# Patient Record
Sex: Female | Born: 1950
Health system: Southern US, Community
[De-identification: ages and names within clinical notes are randomized; demographics above are authoritative.]

## PROBLEM LIST (undated history)

## (undated) DIAGNOSIS — R0600 Dyspnea, unspecified: Secondary | ICD-10-CM

## (undated) DIAGNOSIS — E785 Hyperlipidemia, unspecified: Secondary | ICD-10-CM

## (undated) DIAGNOSIS — N899 Noninflammatory disorder of vagina, unspecified: Secondary | ICD-10-CM

## (undated) DIAGNOSIS — H269 Unspecified cataract: Secondary | ICD-10-CM

## (undated) DIAGNOSIS — N809 Endometriosis, unspecified: Secondary | ICD-10-CM

## (undated) DIAGNOSIS — M810 Age-related osteoporosis without current pathological fracture: Secondary | ICD-10-CM

## (undated) DIAGNOSIS — Q438 Other specified congenital malformations of intestine: Secondary | ICD-10-CM

## (undated) DIAGNOSIS — R0602 Shortness of breath: Secondary | ICD-10-CM

## (undated) DIAGNOSIS — T7840XA Allergy, unspecified, initial encounter: Secondary | ICD-10-CM

## (undated) DIAGNOSIS — D219 Benign neoplasm of connective and other soft tissue, unspecified: Secondary | ICD-10-CM

## (undated) DIAGNOSIS — T753XXA Motion sickness, initial encounter: Secondary | ICD-10-CM

## (undated) DIAGNOSIS — H04129 Dry eye syndrome of unspecified lacrimal gland: Secondary | ICD-10-CM

## (undated) HISTORY — DX: Benign neoplasm of connective and other soft tissue, unspecified: D21.9

## (undated) HISTORY — PX: APPENDECTOMY: SHX54

## (undated) HISTORY — PX: OTHER SURGICAL HISTORY: SHX169

## (undated) HISTORY — DX: Noninflammatory disorder of vagina, unspecified: N89.9

## (undated) HISTORY — DX: Endometriosis, unspecified: N80.9

## (undated) HISTORY — DX: Unspecified cataract: H26.9

## (undated) HISTORY — DX: Hyperlipidemia, unspecified: E78.5

## (undated) HISTORY — PX: EYE SURGERY: SHX253

## (undated) HISTORY — DX: Age-related osteoporosis without current pathological fracture: M81.0

## (undated) HISTORY — DX: Shortness of breath: R06.02

## (undated) HISTORY — DX: Hypercalcemia: E83.52

## (undated) HISTORY — DX: Allergy, unspecified, initial encounter: T78.40XA

## (undated) HISTORY — DX: Dry eye syndrome of unspecified lacrimal gland: H04.129

---

## 1992-11-21 HISTORY — PX: ABDOMINAL HYSTERECTOMY: SHX81

## 1999-09-27 ENCOUNTER — Encounter: Admission: RE | Admit: 1999-09-27 | Discharge: 1999-09-27 | Payer: Self-pay | Admitting: Obstetrics and Gynecology

## 1999-09-27 ENCOUNTER — Encounter: Payer: Self-pay | Admitting: Obstetrics and Gynecology

## 2000-09-29 ENCOUNTER — Encounter: Admission: RE | Admit: 2000-09-29 | Discharge: 2000-09-29 | Payer: Self-pay | Admitting: Family Medicine

## 2000-09-29 ENCOUNTER — Encounter: Payer: Self-pay | Admitting: Family Medicine

## 2001-10-09 ENCOUNTER — Encounter: Admission: RE | Admit: 2001-10-09 | Discharge: 2001-10-09 | Payer: Self-pay | Admitting: Family Medicine

## 2001-10-09 ENCOUNTER — Encounter: Payer: Self-pay | Admitting: Family Medicine

## 2004-02-19 LAB — HM COLONOSCOPY

## 2005-08-24 ENCOUNTER — Ambulatory Visit: Payer: Self-pay | Admitting: Family Medicine

## 2006-09-07 ENCOUNTER — Ambulatory Visit: Payer: Self-pay | Admitting: Family Medicine

## 2007-09-11 ENCOUNTER — Ambulatory Visit: Payer: Self-pay | Admitting: Family Medicine

## 2008-09-16 ENCOUNTER — Ambulatory Visit: Payer: Self-pay | Admitting: Family Medicine

## 2008-09-18 ENCOUNTER — Ambulatory Visit: Payer: Self-pay | Admitting: Family Medicine

## 2009-09-17 ENCOUNTER — Ambulatory Visit: Payer: Self-pay | Admitting: Family Medicine

## 2010-09-28 ENCOUNTER — Ambulatory Visit: Payer: Self-pay

## 2010-10-13 ENCOUNTER — Emergency Department: Payer: Self-pay | Admitting: Emergency Medicine

## 2010-10-26 ENCOUNTER — Ambulatory Visit: Payer: Self-pay

## 2011-10-05 ENCOUNTER — Ambulatory Visit: Payer: Self-pay | Admitting: Family Medicine

## 2012-04-25 ENCOUNTER — Ambulatory Visit: Payer: Self-pay | Admitting: Nurse Practitioner

## 2012-07-18 ENCOUNTER — Other Ambulatory Visit (INDEPENDENT_AMBULATORY_CARE_PROVIDER_SITE_OTHER): Payer: Self-pay | Admitting: Surgery

## 2012-07-18 ENCOUNTER — Encounter (INDEPENDENT_AMBULATORY_CARE_PROVIDER_SITE_OTHER): Payer: Self-pay | Admitting: Surgery

## 2012-07-18 ENCOUNTER — Ambulatory Visit (INDEPENDENT_AMBULATORY_CARE_PROVIDER_SITE_OTHER): Payer: BC Managed Care – PPO | Admitting: Surgery

## 2012-07-18 VITALS — BP 134/82 | HR 68 | Temp 97.9°F | Resp 16 | Ht 69.0 in | Wt 143.2 lb

## 2012-07-18 DIAGNOSIS — D171 Benign lipomatous neoplasm of skin and subcutaneous tissue of trunk: Secondary | ICD-10-CM | POA: Insufficient documentation

## 2012-07-18 DIAGNOSIS — D1779 Benign lipomatous neoplasm of other sites: Secondary | ICD-10-CM

## 2012-07-18 NOTE — Patient Instructions (Signed)
Thanks for your patience.  If you need further assistance after leaving the office, please call our office and speak with Tracy A.  [phone].  If you want to leave a message for Dr. Carolyn Sylvia, please call his office phone at [phone]. 

## 2012-07-18 NOTE — Progress Notes (Signed)
Chief Complaint:  Mass over left scapula for 20 years recently increasing in size  History of Present Illness:  Barbara Gates is an 61 y.o. female referred by Dr. Margo Aye with a mass of her left scapula.  She describes that she has had this mass for approximately 20 years recently become more apparent to her possibly increase in size. It is nontender. She comes with an ultrasound done at [facility]. I was unable to get this to load on the computer but reviewed the report which shows this to be a mass arising above the scapula.  I discussed excision under general the prone position although we might be able to do this under MAC in the lateral position. We'll schedule this at [facility].  History reviewed. No pertinent past medical history.  Past Surgical History  Procedure Date  . Abdominal hysterectomy     Current Outpatient Prescriptions  Medication Sig Dispense Refill  . calcium carbonate (TUMS - DOSED IN MG ELEMENTAL CALCIUM) 500 MG chewable tablet Chew 1 tablet by mouth daily.      . fish oil-omega-3 fatty acids 1000 MG capsule Take 2 g by mouth daily.      . magnesium 30 MG tablet Take 30 mg by mouth 2 (two) times daily.      . Multiple Vitamin (MULTIVITAMIN) tablet Take 1 tablet by mouth daily.      Marland Kitchen OVER THE COUNTER MEDICATION       . Vitamin D, Ergocalciferol, (DRISDOL) 50000 UNITS CAPS Take 50,000 Units by mouth daily.       Review of patient's allergies indicates no known allergies. Family History  Problem Relation Age of Onset  . Cancer Mother     lung, pancreas, melanoma, carcinoid tumors  . Heart disease Mother   . Cancer Sister     breast   Social History:   reports that she has never smoked. She has never used smokeless tobacco. She reports that she drinks alcohol. She reports that she does not use illicit drugs.   REVIEW OF SYSTEMS - PERTINENT POSITIVES ONLY: noncontibutory  Physical Exam:   Blood pressure 134/82, pulse 68, temperature 97.9 F (36.6  C), temperature source Temporal, resp. rate 16, height 5\' 9"  (1.753 m), weight 143 lb 3.2 oz (64.955 kg). Body mass index is 21.15 kg/(m^2).  Gen:  WDWN WF NAD  Neurological: Alert and oriented to person, place, and time. Motor and sensory function is grossly intact  Head: Normocephalic and atraumatic.  Eyes: Conjunctivae are normal. Pupils are equal, round, and reactive to light. No scleral icterus.  Neck: Normal range of motion. Neck supple. No tracheal deviation or thyromegaly present.  Cardiovascular:  SR without murmurs or gallops.  No carotid bruits Respiratory: Effort normal.  No respiratory distress. No chest wall tenderness. Breath sounds normal.  No wheezes, rales or rhonchi.  Abdomen:  nontender GU: Musculoskeletal: Normal range of motion. Extremities are nontender. No cyanosis, edema or clubbing noted  BACK: soft, rubbery mass medial to the scapula that feels like it is in the subcutaneous region.   Lymphadenopathy: No cervical, preauricular, postauricular or axillary adenopathy is present Skin: Skin is warm and dry. No rash noted. No diaphoresis. No erythema. No pallor. Pscyh: Normal mood and affect. Behavior is normal. Judgment and thought content normal.   LABORATORY RESULTS: No results found for this or any previous visit (from the past 48 hour(s)).  RADIOLOGY RESULTS: No results found.  Problem List: There is no problem list on file for  this patient.   Assessment & Plan: Probable lipoma of the back Excision at [facility]    Matt B. Daphine Deutscher, MD, [facility] [phone] beeper [phone]  07/18/2012 2:32 PM

## 2012-09-07 ENCOUNTER — Encounter (HOSPITAL_BASED_OUTPATIENT_CLINIC_OR_DEPARTMENT_OTHER): Payer: Self-pay

## 2012-09-07 ENCOUNTER — Ambulatory Visit (HOSPITAL_BASED_OUTPATIENT_CLINIC_OR_DEPARTMENT_OTHER): Admit: 2012-09-07 | Payer: Self-pay | Admitting: Surgery

## 2012-09-07 SURGERY — EXCISION MASS
Anesthesia: General | Site: Back

## 2012-09-12 LAB — HM HEPATITIS C SCREENING LAB: HM Hepatitis Screen: NEGATIVE

## 2012-10-10 ENCOUNTER — Ambulatory Visit: Payer: Self-pay | Admitting: Family Medicine

## 2012-10-17 ENCOUNTER — Ambulatory Visit: Payer: Self-pay | Admitting: Family Medicine

## 2012-11-08 HISTORY — PX: BREAST BIOPSY: SHX20

## 2012-12-13 HISTORY — PX: OTHER SURGICAL HISTORY: SHX169

## 2012-12-21 ENCOUNTER — Encounter (INDEPENDENT_AMBULATORY_CARE_PROVIDER_SITE_OTHER): Payer: Self-pay

## 2013-06-13 ENCOUNTER — Ambulatory Visit: Payer: Self-pay

## 2013-10-14 LAB — HM DEXA SCAN

## 2013-10-30 ENCOUNTER — Ambulatory Visit: Payer: Self-pay | Admitting: Family Medicine

## 2014-01-09 ENCOUNTER — Other Ambulatory Visit: Payer: Self-pay | Admitting: General Surgery

## 2014-01-09 ENCOUNTER — Telehealth: Payer: Self-pay | Admitting: *Deleted

## 2014-01-09 NOTE — Telephone Encounter (Signed)
I talked with the patient and she had her mammograms at [facility] (Dr Bary Castilla reviewed and stable) She is to continue her Tamoxifen and she desires and feels comfortable to be followed with Dr Venia Minks. Side effects reviewed and discussed, pt agrees to monitor and call if she notices any signs or vaginal bleeding. Her most recent bone density had shown improvement. She will call if new issues arise or concerns.

## 2014-01-09 NOTE — Telephone Encounter (Signed)
Message copied by Carson Myrtle on Thu Jan 09, 2014 11:34 AM ------      Message from: [place], Forest Gleason      Created: Thu Jan 09, 2014 11:26 AM       Patient did not have Dec 2014 mammogram as scheduled by Dolores Frame, NP at [facility] .(scheduled at [facility], ? If completed elsewhere.      Contact patient and see if she has had the mammogram completed: if so, where, if not she needs a screening bilateral mammogram at this time w/ OV to follow.        Tamoxifen RX will be refilled, but f/ u needed.  ------

## 2014-04-22 ENCOUNTER — Ambulatory Visit: Payer: Self-pay | Admitting: Gastroenterology

## 2014-09-02 DIAGNOSIS — K5909 Other constipation: Secondary | ICD-10-CM | POA: Insufficient documentation

## 2014-11-11 ENCOUNTER — Ambulatory Visit: Payer: Self-pay | Admitting: Family Medicine

## 2014-11-11 LAB — HM MAMMOGRAPHY

## 2015-04-17 LAB — CBC AND DIFFERENTIAL
HCT: 39 % (ref 36–46)
HEMOGLOBIN: 13.4 g/dL (ref 12.0–16.0)
Platelets: 214 10*3/uL (ref 150–399)
WBC: 5.2 10^3/mL

## 2015-04-17 LAB — BASIC METABOLIC PANEL
BUN: 15 mg/dL (ref 4–21)
CREATININE: 0.9 mg/dL (ref <=1.1)
Glucose: 96 mg/dL
Potassium: 4.5 mmol/L (ref 3.4–5.3)
Sodium: 141 mmol/L (ref 137–147)

## 2015-04-17 LAB — LIPID PANEL
Cholesterol: 183 mg/dL (ref 0–200)
HDL: 86 mg/dL — AB (ref 35–70)
LDL Cholesterol: 76 mg/dL
Triglycerides: 107 mg/dL (ref 40–160)

## 2015-05-29 ENCOUNTER — Encounter: Payer: Self-pay | Admitting: Family Medicine

## 2015-05-29 ENCOUNTER — Ambulatory Visit
Admission: RE | Admit: 2015-05-29 | Discharge: 2015-05-29 | Disposition: A | Discharge status: Home / Self Care | Payer: BLUE CROSS/BLUE SHIELD | Source: Ambulatory Visit | Attending: Family Medicine | Admitting: Family Medicine

## 2015-05-29 ENCOUNTER — Ambulatory Visit (INDEPENDENT_AMBULATORY_CARE_PROVIDER_SITE_OTHER): Payer: BLUE CROSS/BLUE SHIELD | Admitting: Family Medicine

## 2015-05-29 ENCOUNTER — Telehealth: Payer: Self-pay

## 2015-05-29 VITALS — BP 108/60 | HR 64 | Temp 98.3°F | Resp 16 | Wt 144.4 lb

## 2015-05-29 DIAGNOSIS — S90935A Unspecified superficial injury of left lesser toe(s), initial encounter: Secondary | ICD-10-CM | POA: Diagnosis not present

## 2015-05-29 DIAGNOSIS — X58XXXA Exposure to other specified factors, initial encounter: Secondary | ICD-10-CM | POA: Diagnosis not present

## 2015-05-29 DIAGNOSIS — S92512A Displaced fracture of proximal phalanx of left lesser toe(s), initial encounter for closed fracture: Secondary | ICD-10-CM | POA: Insufficient documentation

## 2015-05-29 NOTE — Telephone Encounter (Signed)
-----   Message from Carmon Ginsberg, [place] sent at 05/29/2015  3:48 PM EDT ----- Minimally displaced fracture of your fourth toe. Proceed with buddy taping and what we discussed in the office. If still having trouble with weight bearing next week let me know and we can refer you.

## 2015-05-29 NOTE — Telephone Encounter (Signed)
Patient has been advised

## 2015-05-29 NOTE — Telephone Encounter (Signed)
Left message to call back  

## 2015-05-29 NOTE — Patient Instructions (Signed)
Continue with ice and elevation for the first 48 hours. Minimize weight bearing if possible.

## 2015-05-29 NOTE — Progress Notes (Signed)
Subjective:     Patient ID: Barbara Gates, female   DOB: 1951-03-11, 64 y.o.   MRN: [id number]  HPI  Chief Complaint  Patient presents with  . Toe Injury    Patient comes in office today with concerns of toe injury after hiting her bed post this morning at 3 am. Patient has bruising and pain on her left foot 4th digit  States she has applied ice and taken ibuprofen but continues to have pain with weight bearing.   Review of Systems     Objective:   Physical Exam  Constitutional: She appears well-developed and well-nourished. No distress.  Musculoskeletal:  Ecchymosis of her left fourth toe and proximal to dorsum of her foot. Non-tender with slight valgus angulation of her fourth toe.       Assessment:    1. Superficial injury of lesser toe of left foot, initial encounter - DG Toe 4th Left; Future    Plan:    Continue cold compresses, elevation and minimize weight bearing. Further f/u pending x-ray report. Discussed buddy taping.

## 2015-06-01 ENCOUNTER — Telehealth: Payer: Self-pay

## 2015-06-01 NOTE — Telephone Encounter (Signed)
Patient advies

## 2015-06-01 NOTE — Telephone Encounter (Signed)
-----   Message from Carmon Ginsberg, [place] sent at 05/29/2015  3:48 PM EDT ----- Minimally displaced fracture of your fourth toe. Proceed with buddy taping and what we discussed in the office. If still having trouble with weight bearing next week let me know and we can refer you.

## 2015-06-11 DIAGNOSIS — H9190 Unspecified hearing loss, unspecified ear: Secondary | ICD-10-CM | POA: Insufficient documentation

## 2015-06-11 DIAGNOSIS — R319 Hematuria, unspecified: Secondary | ICD-10-CM | POA: Insufficient documentation

## 2015-06-11 DIAGNOSIS — R001 Bradycardia, unspecified: Secondary | ICD-10-CM | POA: Insufficient documentation

## 2015-06-11 DIAGNOSIS — E7800 Pure hypercholesterolemia, unspecified: Secondary | ICD-10-CM | POA: Insufficient documentation

## 2015-06-11 DIAGNOSIS — E785 Hyperlipidemia, unspecified: Secondary | ICD-10-CM | POA: Insufficient documentation

## 2015-06-11 DIAGNOSIS — K635 Polyp of colon: Secondary | ICD-10-CM | POA: Insufficient documentation

## 2015-06-11 DIAGNOSIS — S52109A Unspecified fracture of upper end of unspecified radius, initial encounter for closed fracture: Secondary | ICD-10-CM | POA: Insufficient documentation

## 2015-06-11 DIAGNOSIS — M545 Low back pain, unspecified: Secondary | ICD-10-CM | POA: Insufficient documentation

## 2015-06-11 DIAGNOSIS — M818 Other osteoporosis without current pathological fracture: Secondary | ICD-10-CM | POA: Insufficient documentation

## 2015-06-11 DIAGNOSIS — B029 Zoster without complications: Secondary | ICD-10-CM | POA: Insufficient documentation

## 2015-06-11 DIAGNOSIS — M810 Age-related osteoporosis without current pathological fracture: Secondary | ICD-10-CM | POA: Insufficient documentation

## 2015-06-11 DIAGNOSIS — N393 Stress incontinence (female) (male): Secondary | ICD-10-CM | POA: Insufficient documentation

## 2015-06-11 DIAGNOSIS — R928 Other abnormal and inconclusive findings on diagnostic imaging of breast: Secondary | ICD-10-CM | POA: Insufficient documentation

## 2015-06-11 DIAGNOSIS — R799 Abnormal finding of blood chemistry, unspecified: Secondary | ICD-10-CM | POA: Insufficient documentation

## 2015-06-11 DIAGNOSIS — M25569 Pain in unspecified knee: Secondary | ICD-10-CM | POA: Insufficient documentation

## 2015-12-07 ENCOUNTER — Ambulatory Visit (INDEPENDENT_AMBULATORY_CARE_PROVIDER_SITE_OTHER): Payer: BLUE CROSS/BLUE SHIELD | Admitting: Physician Assistant

## 2015-12-07 ENCOUNTER — Encounter: Payer: Self-pay | Admitting: Physician Assistant

## 2015-12-07 VITALS — BP 142/80 | HR 60 | Temp 98.2°F | Resp 16 | Wt 142.6 lb

## 2015-12-07 DIAGNOSIS — M810 Age-related osteoporosis without current pathological fracture: Secondary | ICD-10-CM

## 2015-12-07 DIAGNOSIS — M7541 Impingement syndrome of right shoulder: Secondary | ICD-10-CM

## 2015-12-07 DIAGNOSIS — Z1239 Encounter for other screening for malignant neoplasm of breast: Secondary | ICD-10-CM

## 2015-12-07 DIAGNOSIS — Z23 Encounter for immunization: Secondary | ICD-10-CM | POA: Diagnosis not present

## 2015-12-07 DIAGNOSIS — R413 Other amnesia: Secondary | ICD-10-CM

## 2015-12-07 DIAGNOSIS — R05 Cough: Secondary | ICD-10-CM

## 2015-12-07 DIAGNOSIS — R059 Cough, unspecified: Secondary | ICD-10-CM

## 2015-12-07 MED ORDER — MELOXICAM 15 MG PO TABS: 15.0000 mg | ORAL_TABLET | Freq: Every day | ORAL | Status: DC

## 2015-12-07 NOTE — Patient Instructions (Signed)
* All exercises are 3 sets of 10 and stretches hold for 15 seconds and repeat 3 times*  Impingement Syndrome, Rotator Cuff, Bursitis With Rehab Impingement syndrome is a condition that involves inflammation of the tendons of the rotator cuff and the subacromial bursa, that causes pain in the shoulder. The rotator cuff consists of four tendons and muscles that control much of the shoulder and upper arm function. The subacromial bursa is a fluid filled sac that helps reduce friction between the rotator cuff and one of the bones of the shoulder (acromion). Impingement syndrome is usually an overuse injury that causes swelling of the bursa (bursitis), swelling of the tendon (tendonitis), and/or a tear of the tendon (strain). Strains are classified into three categories. Grade 1 strains cause pain, but the tendon is not lengthened. Grade 2 strains include a lengthened ligament, due to the ligament being stretched or partially ruptured. With grade 2 strains there is still function, although the function may be decreased. Grade 3 strains include a complete tear of the tendon or muscle, and function is usually impaired. SYMPTOMS   Pain around the shoulder, often at the outer portion of the upper arm.  Pain that gets worse with shoulder function, especially when reaching overhead or lifting.  Sometimes, aching when not using the arm.  Pain that wakes you up at night.  Sometimes, tenderness, swelling, warmth, or redness over the affected area.  Loss of strength.  Limited motion of the shoulder, especially reaching behind the back (to the back pocket or to unhook bra) or across your body.  Crackling sound (crepitation) when moving the arm.  Biceps tendon pain and inflammation (in the front of the shoulder). Worse when bending the elbow or lifting. CAUSES  Impingement syndrome is often an overuse injury, in which chronic (repetitive) motions cause the tendons or bursa to become inflamed. A strain occurs  when a force is paced on the tendon or muscle that is greater than it can withstand. Common mechanisms of injury include: Stress from sudden increase in duration, frequency, or intensity of training.  Direct hit (trauma) to the shoulder.  Aging, erosion of the tendon with normal use.  Bony bump on shoulder (acromial spur). RISK INCREASES WITH:  Contact sports (football, wrestling, boxing).  Throwing sports (baseball, tennis, volleyball).  Weightlifting and bodybuilding.  Heavy labor.  Previous injury to the rotator cuff, including impingement.  Poor shoulder strength and flexibility.  Failure to warm up properly before activity.  Inadequate protective equipment.  Old age.  Bony bump on shoulder (acromial spur). PREVENTION   Warm up and stretch properly before activity.  Allow for adequate recovery between workouts.  Maintain physical fitness:  Strength, flexibility, and endurance.  Cardiovascular fitness.  Learn and use proper exercise technique. PROGNOSIS  If treated properly, impingement syndrome usually goes away within 6 weeks. Sometimes surgery is required.  RELATED COMPLICATIONS   Longer healing time if not properly treated, or if not given enough time to heal.  Recurring symptoms, that result in a chronic condition.  Shoulder stiffness, frozen shoulder, or loss of motion.  Rotator cuff tendon tear.  Recurring symptoms, especially if activity is resumed too soon, with overuse, with a direct blow, or when using poor technique. TREATMENT  Treatment first involves the use of ice and medicine, to reduce pain and inflammation. The use of strengthening and stretching exercises may help reduce pain with activity. These exercises may be performed at home or with a therapist. If non-surgical treatment is unsuccessful after  more than 6 months, surgery may be advised. After surgery and rehabilitation, activity is usually possible in 3 months.  MEDICATION  If pain  medicine is needed, nonsteroidal anti-inflammatory medicines (aspirin and ibuprofen), or other minor pain relievers (acetaminophen), are often advised.  Do not take pain medicine for 7 days before surgery.  Prescription pain relievers may be given, if your caregiver thinks they are needed. Use only as directed and only as much as you need.  Corticosteroid injections may be given by your caregiver. These injections should be reserved for the most serious cases, because they may only be given a certain number of times. HEAT AND COLD  Cold treatment (icing) should be applied for 10 to 15 minutes every 2 to 3 hours for inflammation and pain, and immediately after activity that aggravates your symptoms. Use ice packs or an ice massage.  Heat treatment may be used before performing stretching and strengthening activities prescribed by your caregiver, physical therapist, or athletic trainer. Use a heat pack or a warm water soak. SEEK MEDICAL CARE IF:   Symptoms get worse or do not improve in 4 to 6 weeks, despite treatment.  New, unexplained symptoms develop. (Drugs used in treatment may produce side effects.) EXERCISES  RANGE OF MOTION (ROM) AND STRETCHING EXERCISES - Impingement Syndrome (Rotator Cuff  Tendinitis, Bursitis) These exercises may help you when beginning to rehabilitate your injury. Your symptoms may go away with or without further involvement from your physician, physical therapist or athletic trainer. While completing these exercises, remember:   Restoring tissue flexibility helps normal motion to return to the joints. This allows healthier, less painful movement and activity.  An effective stretch should be held for at least 30 seconds.  A stretch should never be painful. You should only feel a gentle lengthening or release in the stretched tissue. STRETCH - Flexion, Standing  Stand with good posture. With an underhand grip on your right / left hand, and an overhand grip on  the opposite hand, grasp a broomstick or cane so that your hands are a little more than shoulder width apart.  Keeping your right / left elbow straight and shoulder muscles relaxed, push the stick with your opposite hand, to raise your right / left arm in front of your body and then overhead. Raise your arm until you feel a stretch in your right / left shoulder, but before you have increased shoulder pain.  Try to avoid shrugging your right / left shoulder as your arm rises, by keeping your shoulder blade tucked down and toward your mid-back spine. Hold for __________ seconds.  Slowly return to the starting position. Repeat __________ times. Complete this exercise __________ times per day. STRETCH - Abduction, Supine  Lie on your back. With an underhand grip on your right / left hand and an overhand grip on the opposite hand, grasp a broomstick or cane so that your hands are a little more than shoulder width apart.  Keeping your right / left elbow straight and your shoulder muscles relaxed, push the stick with your opposite hand, to raise your right / left arm out to the side of your body and then overhead. Raise your arm until you feel a stretch in your right / left shoulder, but before you have increased shoulder pain.  Try to avoid shrugging your right / left shoulder as your arm rises, by keeping your shoulder blade tucked down and toward your mid-back spine. Hold for __________ seconds.  Slowly return to the starting  position. Repeat __________ times. Complete this exercise __________ times per day. ROM - Flexion, Active-Assisted  Lie on your back. You may bend your knees for comfort.  Grasp a broomstick or cane so your hands are about shoulder width apart. Your right / left hand should grip the end of the stick, so that your hand is positioned "thumbs-up," as if you were about to shake hands.  Using your healthy arm to lead, raise your right / left arm overhead, until you feel a gentle  stretch in your shoulder. Hold for __________ seconds.  Use the stick to assist in returning your right / left arm to its starting position. Repeat __________ times. Complete this exercise __________ times per day.  ROM - Internal Rotation, Supine   Lie on your back on a firm surface. Place your right / left elbow about 60 degrees away from your side. Elevate your elbow with a folded towel, so that the elbow and shoulder are the same height.  Using a broomstick or cane and your strong arm, pull your right / left hand toward your body until you feel a gentle stretch, but no increase in your shoulder pain. Keep your shoulder and elbow in place throughout the exercise.  Hold for __________ seconds. Slowly return to the starting position. Repeat __________ times. Complete this exercise __________ times per day. STRETCH - Internal Rotation  Place your right / left hand behind your back, palm up.  Throw a towel or belt over your opposite shoulder. Grasp the towel with your right / left hand.  While keeping an upright posture, gently pull up on the towel, until you feel a stretch in the front of your right / left shoulder.  Avoid shrugging your right / left shoulder as your arm rises, by keeping your shoulder blade tucked down and toward your mid-back spine.  Hold for __________ seconds. Release the stretch, by lowering your healthy hand. Repeat __________ times. Complete this exercise __________ times per day. ROM - Internal Rotation   Using an underhand grip, grasp a stick behind your back with both hands.  While standing upright with good posture, slide the stick up your back until you feel a mild stretch in the front of your shoulder.  Hold for __________ seconds. Slowly return to your starting position. Repeat __________ times. Complete this exercise __________ times per day.  STRETCH - Posterior Shoulder Capsule   Stand or sit with good posture. Grasp your right / left elbow and draw  it across your chest, keeping it at the same height as your shoulder.  Pull your elbow, so your upper arm comes in closer to your chest. Pull until you feel a gentle stretch in the back of your shoulder.  Hold for __________ seconds. Repeat __________ times. Complete this exercise __________ times per day. STRENGTHENING EXERCISES - Impingement Syndrome (Rotator Cuff Tendinitis, Bursitis) These exercises may help you when beginning to rehabilitate your injury. They may resolve your symptoms with or without further involvement from your physician, physical therapist or athletic trainer. While completing these exercises, remember:  Muscles can gain both the endurance and the strength needed for everyday activities through controlled exercises.  Complete these exercises as instructed by your physician, physical therapist or athletic trainer. Increase the resistance and repetitions only as guided.  You may experience muscle soreness or fatigue, but the pain or discomfort you are trying to eliminate should never worsen during these exercises. If this pain does get worse, stop and make sure you  are following the directions exactly. If the pain is still present after adjustments, discontinue the exercise until you can discuss the trouble with your clinician.  During your recovery, avoid activity or exercises which involve actions that place your injured hand or elbow above your head or behind your back or head. These positions stress the tissues which you are trying to heal. STRENGTH - Scapular Depression and Adduction   With good posture, sit on a firm chair. Support your arms in front of you, with pillows, arm rests, or on a table top. Have your elbows in line with the sides of your body.  Gently draw your shoulder blades down and toward your mid-back spine. Gradually increase the tension, without tensing the muscles along the top of your shoulders and the back of your neck.  Hold for __________  seconds. Slowly release the tension and relax your muscles completely before starting the next repetition.  After you have practiced this exercise, remove the arm support and complete the exercise in standing as well as sitting position. Repeat __________ times. Complete this exercise __________ times per day.  STRENGTH - Shoulder Abductors, Isometric  With good posture, stand or sit about 4-6 inches from a wall, with your right / left side facing the wall.  Bend your right / left elbow. Gently press your right / left elbow into the wall. Increase the pressure gradually, until you are pressing as hard as you can, without shrugging your shoulder or increasing any shoulder discomfort.  Hold for __________ seconds.  Release the tension slowly. Relax your shoulder muscles completely before you begin the next repetition. Repeat __________ times. Complete this exercise __________ times per day.  STRENGTH - External Rotators, Isometric  Keep your right / left elbow at your side and bend it 90 degrees.  Step into a door frame so that the outside of your right / left wrist can press against the door frame without your upper arm leaving your side.  Gently press your right / left wrist into the door frame, as if you were trying to swing the back of your hand away from your stomach. Gradually increase the tension, until you are pressing as hard as you can, without shrugging your shoulder or increasing any shoulder discomfort.  Hold for __________ seconds.  Release the tension slowly. Relax your shoulder muscles completely before you begin the next repetition. Repeat __________ times. Complete this exercise __________ times per day.  STRENGTH - Supraspinatus   Stand or sit with good posture. Grasp a __________ weight, or an exercise band or tubing, so that your hand is "thumbs-up," like you are shaking hands.  Slowly lift your right / left arm in a "V" away from your thigh, diagonally into the space  between your side and straight ahead. Lift your hand to shoulder height or as far as you can, without increasing any shoulder pain. At first, many people do not lift their hands above shoulder height.  Avoid shrugging your right / left shoulder as your arm rises, by keeping your shoulder blade tucked down and toward your mid-back spine.  Hold for __________ seconds. Control the descent of your hand, as you slowly return to your starting position. Repeat __________ times. Complete this exercise __________ times per day.  STRENGTH - External Rotators  Secure a rubber exercise band or tubing to a fixed object (table, pole) so that it is at the same height as your right / left elbow when you are standing or sitting on  a firm surface.  Stand or sit so that the secured exercise band is at your uninjured side.  Bend your right / left elbow 90 degrees. Place a folded towel or small pillow under your right / left arm, so that your elbow is a few inches away from your side.  Keeping the tension on the exercise band, pull it away from your body, as if pivoting on your elbow. Be sure to keep your body steady, so that the movement is coming only from your rotating shoulder.  Hold for __________ seconds. Release the tension in a controlled manner, as you return to the starting position. Repeat __________ times. Complete this exercise __________ times per day.  STRENGTH - Internal Rotators   Secure a rubber exercise band or tubing to a fixed object (table, pole) so that it is at the same height as your right / left elbow when you are standing or sitting on a firm surface.  Stand or sit so that the secured exercise band is at your right / left side.  Bend your elbow 90 degrees. Place a folded towel or small pillow under your right / left arm so that your elbow is a few inches away from your side.  Keeping the tension on the exercise band, pull it across your body, toward your stomach. Be sure to keep your  body steady, so that the movement is coming only from your rotating shoulder.  Hold for __________ seconds. Release the tension in a controlled manner, as you return to the starting position. Repeat __________ times. Complete this exercise __________ times per day.  STRENGTH - Scapular Protractors, Standing   Stand arms length away from a wall. Place your hands on the wall, keeping your elbows straight.  Begin by dropping your shoulder blades down and toward your mid-back spine.  To strengthen your protractors, keep your shoulder blades down, but slide them forward on your rib cage. It will feel as if you are lifting the back of your rib cage away from the wall. This is a subtle motion and can be challenging to complete. Ask your caregiver for further instruction, if you are not sure you are doing the exercise correctly.  Hold for __________ seconds. Slowly return to the starting position, resting the muscles completely before starting the next repetition. Repeat __________ times. Complete this exercise __________ times per day. STRENGTH - Scapular Protractors, Supine  Lie on your back on a firm surface. Extend your right / left arm straight into the air while holding a __________ weight in your hand.  Keeping your head and back in place, lift your shoulder off the floor.  Hold for __________ seconds. Slowly return to the starting position, and allow your muscles to relax completely before starting the next repetition. Repeat __________ times. Complete this exercise __________ times per day. STRENGTH - Scapular Protractors, Quadruped  Get onto your hands and knees, with your shoulders directly over your hands (or as close as you can be, comfortably).  Keeping your elbows locked, lift the back of your rib cage up into your shoulder blades, so your mid-back rounds out. Keep your neck muscles relaxed.  Hold this position for __________ seconds. Slowly return to the starting position and  allow your muscles to relax completely before starting the next repetition. Repeat __________ times. Complete this exercise __________ times per day.  STRENGTH - Scapular Retractors  Secure a rubber exercise band or tubing to a fixed object (table, pole), so that it is at  the height of your shoulders when you are either standing, or sitting on a firm armless chair.  With a palm down grip, grasp an end of the band in each hand. Straighten your elbows and lift your hands straight in front of you, at shoulder height. Step back, away from the secured end of the band, until it becomes tense.  Squeezing your shoulder blades together, draw your elbows back toward your sides, as you bend them. Keep your upper arms lifted away from your body throughout the exercise.  Hold for __________ seconds. Slowly ease the tension on the band, as you reverse the directions and return to the starting position. Repeat __________ times. Complete this exercise __________ times per day. STRENGTH - Shoulder Extensors   Secure a rubber exercise band or tubing to a fixed object (table, pole) so that it is at the height of your shoulders when you are either standing, or sitting on a firm armless chair.  With a thumbs-up grip, grasp an end of the band in each hand. Straighten your elbows and lift your hands straight in front of you, at shoulder height. Step back, away from the secured end of the band, until it becomes tense.  Squeezing your shoulder blades together, pull your hands down to the sides of your thighs. Do not allow your hands to go behind you.  Hold for __________ seconds. Slowly ease the tension on the band, as you reverse the directions and return to the starting position. Repeat __________ times. Complete this exercise __________ times per day.  STRENGTH - Scapular Retractors and External Rotators   Secure a rubber exercise band or tubing to a fixed object (table, pole) so that it is at the height as your  shoulders, when you are either standing, or sitting on a firm armless chair.  With a palm down grip, grasp an end of the band in each hand. Bend your elbows 90 degrees and lift your elbows to shoulder height, at your sides. Step back, away from the secured end of the band, until it becomes tense.  Squeezing your shoulder blades together, rotate your shoulders so that your upper arms and elbows remain stationary, but your fists travel upward to head height.  Hold for __________ seconds. Slowly ease the tension on the band, as you reverse the directions and return to the starting position. Repeat __________ times. Complete this exercise __________ times per day.  STRENGTH - Scapular Retractors and External Rotators, Rowing   Secure a rubber exercise band or tubing to a fixed object (table, pole) so that it is at the height of your shoulders, when you are either standing, or sitting on a firm armless chair.  With a palm down grip, grasp an end of the band in each hand. Straighten your elbows and lift your hands straight in front of you, at shoulder height. Step back, away from the secured end of the band, until it becomes tense.  Step 1: Squeeze your shoulder blades together. Bending your elbows, draw your hands to your chest, as if you are rowing a boat. At the end of this motion, your hands and elbow should be at shoulder height and your elbows should be out to your sides.  Step 2: Rotate your shoulders, to raise your hands above your head. Your forearms should be vertical and your upper arms should be horizontal.  Hold for __________ seconds. Slowly ease the tension on the band, as you reverse the directions and return to the starting position.  Repeat __________ times. Complete this exercise __________ times per day.  STRENGTH - Scapular Depressors  Find a sturdy chair without wheels, such as a dining room chair.  Keeping your feet on the floor, and your hands on the chair arms, lift your  bottom up from the seat, and lock your elbows.  Keeping your elbows straight, allow gravity to pull your body weight down. Your shoulders will rise toward your ears.  Raise your body against gravity by drawing your shoulder blades down your back, shortening the distance between your shoulders and ears. Although your feet should always maintain contact with the floor, your feet should progressively support less body weight, as you get stronger.  Hold for __________ seconds. In a controlled and slow manner, lower your body weight to begin the next repetition. Repeat __________ times. Complete this exercise __________ times per day.    This information is not intended to replace advice given to you by your health care provider. Make sure you discuss any questions you have with your health care provider.   Document Released: 11/07/2005 Document Revised: 11/28/2014 Document Reviewed: 02/19/2009 [employer] Interactive Patient Education [employer].

## 2015-12-07 NOTE — Progress Notes (Signed)
Patient: Barbara Gates Female    DOB: 02/08/1951   65 y.o.   MRN: [id number] Visit Date: 12/07/2015  Today's Provider: Mar Daring, PA-C   Chief Complaint  Patient presents with  . Cough  . Shoulder Pain  . Memory Concern   Subjective:    Cough This is a new problem. The current episode started 1 to 4 weeks ago. The problem has been gradually improving. The problem occurs hourly. The cough is non-productive. Associated symptoms include chest pain (it hurst under the right breast and this is what is bothering more) and wheezing (ocassional). Pertinent negatives include no chills, ear congestion, ear pain, fever, headaches, nasal congestion, postnasal drip, rhinorrhea, sore throat or shortness of breath. The symptoms are aggravated by lying down and other (diferrent positions and talking). She has tried OTC cough suppressant (Pushing Fluids) for the symptoms. The treatment provided mild relief.  Shoulder Pain  The pain is present in the right shoulder. This is a new problem. The current episode started more than 1 month ago (for the past 6 months). There has been no history of extremity trauma. The problem has been unchanged. The quality of the pain is described as aching. The pain is at a severity of 4/10 (when moving it at certain positions. When at rest pain quaility is 0/10). Associated symptoms include stiffness (a little). Pertinent negatives include no fever. The symptoms are aggravated by activity (movement or by putting her Jacket). Treatments tried: Ibuprofen. The treatment provided no relief.  Memory: Patient is also concern that is not remembering a lot of things. Noticing not functioning the way that she used to.Biggest concern is word searching. Does have family history of dementia in father.   Cognitive Testing - 6-CIT  Correct? Score   What year is it? yes 0 0 or 4  What month is it? yes 0 0 or 3  Memorize:    Barbara Gates,  42,  [address]  [place],      What time is it? (within 1 hour) yes 0 0 or 3  Count backwards from 20 yes 0 0, 2, or 4  Name the months of the year yes 0 0, 2, or 4  Repeat name & address above no 3 0, 2, 4, 6, 8, or 10       TOTAL SCORE  0/28   Interpretation:  Normal  Normal (0-7) Abnormal (8-28)      No Known Allergies Previous Medications   ASPIRIN 81 MG TABLET    Take 81 mg by mouth daily.   CARBOXYMETHYLCELLUL-GLYCERIN (LUBRICATING EYE DROPS OP)    Apply to eye 2 (two) times daily.   CHOLECALCIFEROL (VITAMIN D) 1000 UNITS TABLET    Take by mouth.   MAGNESIUM 100 MG CAPS    Take by mouth.   MELATONIN 3 MG CAPS    Take by mouth. Reported on 12/07/2015   MULTIPLE VITAMIN (MULTIVITAMIN) TABLET    Take 1 tablet by mouth daily.   MULTIPLE VITAMINS-MINERALS (EYE VITAMINS) CAPS    Take by mouth daily.   POLYETHYLENE GLYCOL POWDER (MIRALAX) POWDER    Take 1 Container by mouth daily.   TAMOXIFEN (NOLVADEX) 20 MG TABLET    TAKE 1 TABLET BY MOUTH EVERY DAY    Review of Systems  Constitutional: Negative for fever and chills.  HENT: Negative for congestion, ear pain, postnasal drip, rhinorrhea, sinus pressure, sneezing and sore throat.   Respiratory: Positive for cough  and wheezing (ocassional). Negative for chest tightness and shortness of breath.   Cardiovascular: Positive for chest pain (it hurst under the right breast and this is what is bothering more).  Gastrointestinal: Negative.   Endocrine: Negative.   Genitourinary: Negative.   Musculoskeletal: Positive for arthralgias (right shoulder) and stiffness (a little).  Skin: Negative.   Allergic/Immunologic: Negative.   Neurological: Negative for dizziness, weakness and headaches.  Hematological: Negative.   Psychiatric/Behavioral: Negative.     Social History  Substance Use Topics  . Smoking status: Never Smoker   . Smokeless tobacco: Never Used  . Alcohol Use: Yes     Comment: 1 glass of wine + 1 margarita per week   Objective:   BP 142/80 mmHg   Pulse 60  Temp(Src) 98.2 F (36.8 C) (Oral)  Resp 16  Wt 142 lb 9.6 oz (64.683 kg)  SpO2 97%  Physical Exam  Constitutional: She appears well-developed and well-nourished. No distress.  HENT:  Head: Normocephalic and atraumatic.  Right Ear: Hearing, tympanic membrane, external ear and ear canal normal.  Left Ear: Hearing, tympanic membrane, external ear and ear canal normal.  Nose: Nose normal. Right sinus exhibits no maxillary sinus tenderness and no frontal sinus tenderness. Left sinus exhibits no maxillary sinus tenderness and no frontal sinus tenderness.  Mouth/Throat: Uvula is midline, oropharynx is clear and moist and mucous membranes are normal. No oropharyngeal exudate, posterior oropharyngeal edema or posterior oropharyngeal erythema.  Eyes: Conjunctivae are normal. Pupils are equal, round, and reactive to light. Right eye exhibits no discharge. Left eye exhibits no discharge. No scleral icterus.  Neck: Normal range of motion. Neck supple. No tracheal deviation present. No thyromegaly present.  Cardiovascular: Normal rate, regular rhythm and normal heart sounds.  Exam reveals no gallop and no friction rub.   No murmur heard. Pulmonary/Chest: Effort normal and breath sounds normal. No stridor. No respiratory distress. She has no wheezes. She has no rales. She exhibits no tenderness (could not recreate tenderness under right breast. States it only bothers her when she coughs).  Musculoskeletal:       Right shoulder: She exhibits normal range of motion, no tenderness, no bony tenderness, no swelling, no spasm, normal pulse and normal strength.       Left shoulder: Normal.  All ROM was WNL and Strength was 5/5 all motions. Had discomfort with some ER movements that involve infraspinatus.  Lymphadenopathy:    She has no cervical adenopathy.  Skin: Skin is warm and dry. She is not diaphoretic.  Vitals reviewed.       Assessment & Plan:     1. Impingement syndrome of right  shoulder She has been taking OTC IBU twice daily without relief.  Will increase to Meloxicam as below for a couple of weeks to see if there is any improvement. Discussed possibly getting an xray to R/O bony abnormality such as bone spurs or acromion hook as cause of rotator cuff tendinitis/tendinosis. We did also discuss that if she does not improve with meloxicam we could try oral prednisone or cortisone injection.  I did also give her exercises and stretches she can begin on at home. She is to call the office if no improvement in 2 weeks.  - meloxicam (MOBIC) 15 MG tablet; Take 1 tablet (15 mg total) by mouth daily.  Dispense: 15 tablet; Refill: 0  2. Memory changes Discussed possibly adding Vayacog.  She was given information and is to call the office if she chooses to try this  for memory improvement.  Will test annually for changes.  3. Cough Improving.  Continue OTC delsym.  Call if worsens.  4. Breast cancer screening Never received letter to schedule mammogram which was due in Nov 2016.  Will order as below.  Information for [facility] breast clinic given to patient so she may schedule this appt at her convenience. - MM Digital Screening; Future  5. Osteoporosis Last bone density was done in 2014 and showed osteoporosis.  Has been on tamoxifen since then.  Was to repeat in 2016.  Will order as below and f/u pending results. - DG Bone Density; Future  6. Need for influenza vaccination Flu vaccine given today without complication. - Flu Vaccine QUAD 36+ mos IM       Mar Daring, PA-C  [facility]

## 2015-12-08 ENCOUNTER — Ambulatory Visit
Admission: RE | Admit: 2015-12-08 | Discharge: 2015-12-08 | Disposition: A | Discharge status: Home / Self Care | Payer: BLUE CROSS/BLUE SHIELD | Source: Ambulatory Visit | Attending: Physician Assistant | Admitting: Physician Assistant

## 2015-12-08 ENCOUNTER — Telehealth: Payer: Self-pay | Admitting: Physician Assistant

## 2015-12-08 DIAGNOSIS — M25511 Pain in right shoulder: Secondary | ICD-10-CM | POA: Insufficient documentation

## 2015-12-08 DIAGNOSIS — M7541 Impingement syndrome of right shoulder: Secondary | ICD-10-CM

## 2015-12-08 NOTE — Telephone Encounter (Signed)
Left message saying that order has been placed.

## 2015-12-08 NOTE — Telephone Encounter (Signed)
This has been ordered.  Patient may go to [facility] on [address] at her convenience.

## 2015-12-08 NOTE — Telephone Encounter (Signed)
Pt is requesting order for x ray of right shoulder for shoulder pain.Pt states she will not be home today but can leave message at [phone]

## 2015-12-09 ENCOUNTER — Telehealth: Payer: Self-pay

## 2015-12-09 DIAGNOSIS — M7541 Impingement syndrome of right shoulder: Secondary | ICD-10-CM

## 2015-12-09 DIAGNOSIS — M19011 Primary osteoarthritis, right shoulder: Secondary | ICD-10-CM

## 2015-12-09 NOTE — Telephone Encounter (Signed)
Pt is returning call.  [id number]

## 2015-12-09 NOTE — Telephone Encounter (Signed)
Barbara Gates, just wanted to clarify. Did you mean AC joint? I think autocorrect tried to "correct" you, but I just want to make sure this was what you were trying to say. Please advise. Thanks!

## 2015-12-09 NOTE — Telephone Encounter (Signed)
Yes I am so sorry that should say AC. Dictation autocorrected.

## 2015-12-09 NOTE — Telephone Encounter (Signed)
Left message to call back  

## 2015-12-09 NOTE — Telephone Encounter (Signed)
-----   Message from Mar Daring, [place] sent at 12/08/2015  3:03 PM EST ----- No bone spurs noted but there is early joint space narrowing in the before meals joint and the shoulder joint indicating arthritis.

## 2015-12-10 NOTE — Telephone Encounter (Signed)
I can do cortisone injection if she would like or she can be referred to ortho. Which would she prefer?

## 2015-12-10 NOTE — Telephone Encounter (Signed)
Advised patient as below. Patient wants to go ahead and schedule a steroid injection at ortho. She reports that she will be leaving on her trip on 01/03/16 and wants to make sure that she will have enough time to get better. Could we possibly go ahead and start treatment now? Patient reports that she is still taking the Meloxicam, but wants to have this scheduled just in case the medication does not resolve her pain.

## 2015-12-11 NOTE — Telephone Encounter (Signed)
Pt informed and she would rather go to ortho.

## 2015-12-11 NOTE — Telephone Encounter (Signed)
Ortho referral placed

## 2015-12-11 NOTE — Telephone Encounter (Signed)
LMTCB  Thanks,  -Joseline 

## 2015-12-16 ENCOUNTER — Telehealth: Payer: Self-pay | Admitting: Physician Assistant

## 2015-12-16 NOTE — Telephone Encounter (Signed)
Results pending. Pt advised and she was going to call [facility] for the result to take to her ortho appt. sd

## 2015-12-16 NOTE — Telephone Encounter (Signed)
Pt is requesting the results of her bone density test.   [id number]

## 2015-12-17 ENCOUNTER — Encounter: Payer: Self-pay | Admitting: Family Medicine

## 2015-12-22 ENCOUNTER — Telehealth: Payer: Self-pay

## 2015-12-22 ENCOUNTER — Ambulatory Visit
Admission: RE | Admit: 2015-12-22 | Discharge: 2015-12-22 | Disposition: A | Discharge status: Home / Self Care | Payer: BLUE CROSS/BLUE SHIELD | Source: Ambulatory Visit | Attending: Physician Assistant | Admitting: Physician Assistant

## 2015-12-22 DIAGNOSIS — Z1231 Encounter for screening mammogram for malignant neoplasm of breast: Secondary | ICD-10-CM | POA: Insufficient documentation

## 2015-12-22 DIAGNOSIS — Z1239 Encounter for other screening for malignant neoplasm of breast: Secondary | ICD-10-CM

## 2015-12-22 NOTE — Telephone Encounter (Signed)
-----   Message from Mar Daring, [place] sent at 12/22/2015  2:54 PM EST ----- Normal mammogram. Repeat screening in one year.

## 2015-12-22 NOTE — Telephone Encounter (Signed)
Patient advised as directed below.  Thanks,  -Bess Saltzman 

## 2015-12-30 ENCOUNTER — Ambulatory Visit (INDEPENDENT_AMBULATORY_CARE_PROVIDER_SITE_OTHER): Payer: BLUE CROSS/BLUE SHIELD | Admitting: Family Medicine

## 2015-12-30 ENCOUNTER — Encounter: Payer: Self-pay | Admitting: Family Medicine

## 2015-12-30 VITALS — BP 118/76 | HR 60 | Temp 98.6°F | Resp 16 | Wt 141.0 lb

## 2015-12-30 DIAGNOSIS — M818 Other osteoporosis without current pathological fracture: Secondary | ICD-10-CM | POA: Diagnosis not present

## 2015-12-30 DIAGNOSIS — R413 Other amnesia: Secondary | ICD-10-CM

## 2015-12-30 DIAGNOSIS — M810 Age-related osteoporosis without current pathological fracture: Secondary | ICD-10-CM

## 2015-12-30 DIAGNOSIS — M7541 Impingement syndrome of right shoulder: Secondary | ICD-10-CM | POA: Diagnosis not present

## 2015-12-30 MED ORDER — PHOSPHATIDYLSERINE-DHA-EPA 100-19.5-6.5 MG PO CAPS: 1.0000 | ORAL_CAPSULE | Freq: Every day | ORAL | Status: DC

## 2015-12-30 MED ORDER — MELOXICAM 15 MG PO TABS: 15.0000 mg | ORAL_TABLET | Freq: Every day | ORAL | Status: DC

## 2015-12-30 NOTE — Progress Notes (Signed)
Patient ID: Elana Alm, female   DOB: 1951/01/26, 65 y.o.   MRN: [id number]        Patient: Barbara Gates Female    DOB: Mar 24, 1951   65 y.o.   MRN: [id number] Visit Date: 12/30/2015  Today's Provider: Margarita Rana, MD   Chief Complaint  Patient presents with  . Osteoporosis   Subjective:    HPI   Osteoporosis: Patient complains of osteoporosis. She was diagnosed with osteoporosis by bone density scan in 12/09/2015. Patient admits to history of fracture.The cause of osteoporosis is felt to be due to postmenopausal estrogen deficiency.   She is currently being treated with calcium and vitamin D supplementation.  She is not currently being treated with bisphosphonates  Osteoporosis Risk Factors  Nonmodifiable Personal Hx of fracture as an adult: yes - Fractured toe and arm Hx of fracture in first-degree relative: yes - Mom fractured hip Caucasian race: yes Advanced age: yes Female sex: yes Dementia: no Poor health/frailty: no  Potentially modifiable: Tobacco use: no Low body weight (123XX123 lbs): not applicable Estrogen deficiency  early menopause (age <45) or bilateral ovariectomy: yes   prolonged premenopausal amenorrhea (>1 yr): no Low calcium intake (lifelong): no Alcoholism: no Recurrent falls: no Inadequate physical activity: yes         No Known Allergies Previous Medications   ASPIRIN 81 MG TABLET    Take 81 mg by mouth daily.   CARBOXYMETHYLCELLUL-GLYCERIN (LUBRICATING EYE DROPS OP)    Apply to eye 2 (two) times daily.   CHOLECALCIFEROL (VITAMIN D) 1000 UNITS TABLET    Take by mouth.   MELOXICAM (MOBIC) 15 MG TABLET    Take 1 tablet (15 mg total) by mouth daily.   MULTIPLE VITAMIN (MULTIVITAMIN) TABLET    Take 1 tablet by mouth daily.   MULTIPLE VITAMINS-MINERALS (EYE VITAMINS) CAPS    Take by mouth daily.   POLYETHYLENE GLYCOL POWDER (MIRALAX) POWDER    Take 1 Container by mouth daily.   TAMOXIFEN (NOLVADEX) 20 MG TABLET    TAKE 1  TABLET BY MOUTH EVERY DAY    Review of Systems  Constitutional: Negative.   Musculoskeletal: Positive for arthralgias (Right shoulder pain; being treated by orthopedic and PT.). Negative for myalgias, back pain, joint swelling, gait problem, neck pain and neck stiffness.  Neurological: Negative for dizziness, light-headedness and headaches.    Social History  Substance Use Topics  . Smoking status: Never Smoker   . Smokeless tobacco: Never Used  . Alcohol Use: Yes     Comment: 1 glass of wine + 1 margarita per week   Objective:   BP 118/76 mmHg  Pulse 60  Temp(Src) 98.6 F (37 C) (Oral)  Resp 16  Wt 141 lb (63.957 kg)  Physical Exam  Constitutional: She is oriented to person, place, and time. She appears well-developed and well-nourished.  Neurological: She is alert and oriented to person, place, and time.  Psychiatric: She has a normal mood and affect. Her behavior is normal. Judgment and thought content normal.      Assessment & Plan:     1. Osteoporosis Improved some. Will continue Tamoxifen.    2. Adult idiopathic generalized osteoporosis Will treat as above. Continue exercise and check labs.   - VITAMIN D 25 Hydroxy (Vit-D Deficiency, Fractures)  3. Hypercalcemia Will check labs to make sure other cause for osteoporosis.  - PTH, Intact and Calcium  4. Memory impairment of gradual onset Will start Vagacog per patient request and see if  helps memory.   - Phosphatidylserine-DHA-EPA (VAYACOG) 100-19.5-6.5 MG CAPS; Take 1 capsule by mouth daily.  Dispense: 30 capsule; Refill: 5  5. Impingement syndrome of right shoulder Continue medication and PT. Further plan as needed.   - meloxicam (MOBIC) 15 MG tablet; Take 1 tablet (15 mg total) by mouth daily.  Dispense: 30 tablet; Refill: 5     Greater than 25 minutes spent in direct patient care. Reviewed medical problems and discussing management and appropriate treatment as noted above.    Patient was seen and examined  by Jerrell Belfast, MD, and note scribed by Ashley Royalty, CMA.  I have reviewed the document for accuracy and completeness and I agree with above. - Jerrell Belfast, MD   Margarita Rana, MD  [facility]

## 2015-12-31 ENCOUNTER — Telehealth: Payer: Self-pay

## 2015-12-31 LAB — PTH, INTACT AND CALCIUM
Calcium: 9.6 mg/dL (ref 8.7–10.3)
PTH: 29 pg/mL (ref 15–65)

## 2015-12-31 LAB — VITAMIN D 25 HYDROXY (VIT D DEFICIENCY, FRACTURES): VIT D 25 HYDROXY: 40.1 ng/mL (ref 30.0–100.0)

## 2015-12-31 NOTE — Telephone Encounter (Signed)
Advised pt of lab results. Pt verbally acknowledges understanding. Emily Drozdowski, CMA   

## 2015-12-31 NOTE — Telephone Encounter (Signed)
-----   Message from Margarita Rana, MD sent at 12/31/2015 11:46 AM EST ----- Labs stable. Vit D in normal range. No need for supplements and PTH appropriately low for calcium level. Thanks.

## 2016-04-14 ENCOUNTER — Other Ambulatory Visit: Payer: Self-pay

## 2016-04-14 DIAGNOSIS — M7541 Impingement syndrome of right shoulder: Secondary | ICD-10-CM

## 2016-04-14 DIAGNOSIS — R413 Other amnesia: Secondary | ICD-10-CM

## 2016-04-14 MED ORDER — MELOXICAM 15 MG PO TABS: 15.0000 mg | ORAL_TABLET | Freq: Every day | ORAL | Status: DC

## 2016-04-14 MED ORDER — PHOSPHATIDYLSERINE-DHA-EPA 100-19.5-6.5 MG PO CAPS: 1.0000 | ORAL_CAPSULE | Freq: Every day | ORAL | Status: DC

## 2016-05-04 ENCOUNTER — Telehealth: Payer: Self-pay | Admitting: Family Medicine

## 2016-05-04 ENCOUNTER — Encounter: Payer: Self-pay | Admitting: Physician Assistant

## 2016-05-04 ENCOUNTER — Ambulatory Visit (INDEPENDENT_AMBULATORY_CARE_PROVIDER_SITE_OTHER): Payer: BLUE CROSS/BLUE SHIELD | Admitting: Physician Assistant

## 2016-05-04 VITALS — BP 110/78 | HR 65 | Temp 98.5°F | Resp 16 | Wt 141.2 lb

## 2016-05-04 DIAGNOSIS — J4521 Mild intermittent asthma with (acute) exacerbation: Secondary | ICD-10-CM

## 2016-05-04 NOTE — Telephone Encounter (Signed)
Has had a lingering cough, choke, or a tightness or a flutter in her chest. Does not feel like it is her heart. Does mow etc. Without difficulty.   Does not feel like her heart is racing. Blood pressure is stable.  Pulse is 64. Stress last week. Was shaky and stress last.  No chest pressure.  Still doing her ADLs.  Has been on and off since February. Will schedule OV.

## 2016-05-04 NOTE — Patient Instructions (Addendum)
Albuterol inhalation aerosol What is this medicine? ALBUTEROL (al Normajean [place]) is a bronchodilator. It helps open up the airways in your lungs to make it easier to breathe. This medicine is used to treat and to prevent bronchospasm. This medicine may be used for other purposes; ask your health care provider or pharmacist if you have questions. What should I tell my health care provider before I take this medicine? They need to know if you have any of the following conditions: -diabetes -heart disease or irregular heartbeat -high blood pressure -pheochromocytoma -seizures -thyroid disease -an unusual or allergic reaction to albuterol, levalbuterol, sulfites, other medicines, foods, dyes, or preservatives -pregnant or trying to get pregnant -breast-feeding How should I use this medicine? This medicine is for inhalation through the mouth. Follow the directions on your prescription label. Take your medicine at regular intervals. Do not use more often than directed. Make sure that you are using your inhaler correctly. Ask you doctor or health care provider if you have any questions. Talk to your pediatrician regarding the use of this medicine in children. Special care may be needed. Overdosage: If you think you have taken too much of this medicine contact a poison control center or emergency room at once. NOTE: This medicine is only for you. Do not share this medicine with others. What if I miss a dose? If you miss a dose, use it as soon as you can. If it is almost time for your next dose, use only that dose. Do not use double or extra doses. What may interact with this medicine? -anti-infectives like chloroquine and pentamidine -caffeine -cisapride -diuretics -medicines for colds -medicines for depression or for emotional or psychotic conditions -medicines for weight loss including some herbal products -methadone -some antibiotics like clarithromycin, erythromycin, levofloxacin, and  linezolid -some heart medicines -steroid hormones like dexamethasone, cortisone, hydrocortisone -theophylline -thyroid hormones This list may not describe all possible interactions. Give your health care provider a list of all the medicines, herbs, non-prescription drugs, or dietary supplements you use. Also tell them if you smoke, drink alcohol, or use illegal drugs. Some items may interact with your medicine. What should I watch for while using this medicine? Tell your doctor or health care professional if your symptoms do not improve. Do not use extra albuterol. If your asthma or bronchitis gets worse while you are using this medicine, call your doctor right away. If your mouth gets dry try chewing sugarless gum or sucking hard candy. Drink water as directed. What side effects may I notice from receiving this medicine? Side effects that you should report to your doctor or health care professional as soon as possible: -allergic reactions like skin rash, itching or hives, swelling of the face, lips, or tongue -breathing problems -chest pain -feeling faint or lightheaded, falls -high blood pressure -irregular heartbeat -fever -muscle cramps or weakness -pain, tingling, numbness in the hands or feet -vomiting Side effects that usually do not require medical attention (report to your doctor or health care professional if they continue or are bothersome): -cough -difficulty sleeping -headache -nervousness or trembling -stomach upset -stuffy or runny nose -throat irritation -unusual taste This list may not describe all possible side effects. Call your doctor for medical advice about side effects. You may report side effects to FDA at 1-800-FDA-1088. Where should I keep my medicine? Keep out of the reach of children. Store at room temperature between 15 and 30 degrees C (59 and 86 degrees F). The contents are under pressure and  may burst when exposed to heat or flame. Do not freeze. This  medicine does not work as well if it is too cold. Throw away any unused medicine after the expiration date. Inhalers need to be thrown away after the labeled number of puffs have been used or by the expiration date; whichever comes first. Ventolin HFA should be thrown away 12 months after removing from foil pouch. Check the instructions that come with your medicine. NOTE: This sheet is a summary. It may not cover all possible information. If you have questions about this medicine, talk to your doctor, pharmacist, or health care provider.    2016, Elsevier/Gold Standard. (2013-04-25 10:57:17) Acute Bronchitis Bronchitis is inflammation of the airways that extend from the windpipe into the lungs (bronchi). The inflammation often causes mucus to develop. This leads to a cough, which is the most common symptom of bronchitis.  In acute bronchitis, the condition usually develops suddenly and goes away over time, usually in a couple weeks. Smoking, allergies, and asthma can make bronchitis worse. Repeated episodes of bronchitis may cause further lung problems.  CAUSES Acute bronchitis is most often caused by the same virus that causes a cold. The virus can spread from person to person (contagious) through coughing, sneezing, and touching contaminated objects. SIGNS AND SYMPTOMS   Cough.   Fever.   Coughing up mucus.   Body aches.   Chest congestion.   Chills.   Shortness of breath.   Sore throat.  DIAGNOSIS  Acute bronchitis is usually diagnosed through a physical exam. Your health care provider will also ask you questions about your medical history. Tests, such as chest X-rays, are sometimes done to rule out other conditions.  TREATMENT  Acute bronchitis usually goes away in a couple weeks. Oftentimes, no medical treatment is necessary. Medicines are sometimes given for relief of fever or cough. Antibiotic medicines are usually not needed but may be prescribed in certain situations.  In some cases, an inhaler may be recommended to help reduce shortness of breath and control the cough. A cool mist vaporizer may also be used to help thin bronchial secretions and make it easier to clear the chest.  HOME CARE INSTRUCTIONS  Get plenty of rest.   Drink enough fluids to keep your urine clear or pale yellow (unless you have a medical condition that requires fluid restriction). Increasing fluids may help thin your respiratory secretions (sputum) and reduce chest congestion, and it will prevent dehydration.   Take medicines only as directed by your health care provider.  If you were prescribed an antibiotic medicine, finish it all even if you start to feel better.  Avoid smoking and secondhand smoke. Exposure to cigarette smoke or irritating chemicals will make bronchitis worse. If you are a smoker, consider using nicotine gum or skin patches to help control withdrawal symptoms. Quitting smoking will help your lungs heal faster.   Reduce the chances of another bout of acute bronchitis by washing your hands frequently, avoiding people with cold symptoms, and trying not to touch your hands to your mouth, nose, or eyes.   Keep all follow-up visits as directed by your health care provider.  SEEK MEDICAL CARE IF: Your symptoms do not improve after 1 week of treatment.  SEEK IMMEDIATE MEDICAL CARE IF:  You develop an increased fever or chills.   You have chest pain.   You have severe shortness of breath.  You have bloody sputum.   You develop dehydration.  You faint or repeatedly feel  like you are going to pass out.  You develop repeated vomiting.  You develop a severe headache. MAKE SURE YOU:   Understand these instructions.  Will watch your condition.  Will get help right away if you are not doing well or get worse.   This information is not intended to replace advice given to you by your health care provider. Make sure you discuss any questions you have with  your health care provider.   Document Released: 12/15/2004 Document Revised: 11/28/2014 Document Reviewed: 04/30/2013 Elsevier Interactive Patient Education [employer].

## 2016-05-04 NOTE — Progress Notes (Signed)
Patient: Barbara Gates Female    DOB: 1951-01-16   65 y.o.   MRN: [id number] Visit Date: 05/04/2016  Today's Provider: Mar Daring, PA-C   Chief Complaint  Patient presents with  . Palpitations    and chest tightness   Subjective:    HPI  Barbara Gates is here today with c/o chest tightness and cough. This started in February is much better but still has the symptoms. Her and her husband both had similar symptoms. Her husband's cough was so bad she had to take him to the ER where he was diagnosed with bronchitis.   She reports that she gets a cough when she is rolling in her bed, takes a deep breath or gets in her car. She also reports that almost always when she gets in her car she feels and notice the cough or feeling of sensation to cough. She is feeling very tired as it is affecting her sleep some. She also reports that she does have some DOE with stairs, otherwise none.   She did had 2 episodes of heart burn back in March but she feels it was from the stress of preparing taxes for other people. She denies headache, chest pain, leg swelling, or lightheadedness. She reports that last week she was anxious and nervous because the mortgage broker came in and they were trying to settle things since her mother died 2 years ago.     No Known Allergies Current Meds  Medication Sig  . aspirin 81 MG tablet Take 81 mg by mouth daily.  Marland Kitchen CALCIUM-VITAMIN D PO Take 300 mg by mouth every morning.  . Carboxymethylcellul-Glycerin (LUBRICATING EYE DROPS OP) Apply to eye 2 (two) times daily.  Marland Kitchen ibuprofen (ADVIL,MOTRIN) 200 MG tablet Take 200 mg by mouth every 6 (six) hours as needed.  . meloxicam (MOBIC) 15 MG tablet Take 1 tablet (15 mg total) by mouth daily.  . Multiple Vitamin (MULTIVITAMIN) tablet Take 1 tablet by mouth daily.  . Multiple Vitamins-Minerals (EYE VITAMINS) CAPS Take by mouth daily.  . Phosphatidylserine-DHA-EPA (VAYACOG) 100-19.5-6.5 MG CAPS Take 1  capsule by mouth daily.  . polyethylene glycol powder (MIRALAX) powder Take 1 Container by mouth daily.   . tamoxifen (NOLVADEX) 20 MG tablet TAKE 1 TABLET BY MOUTH EVERY DAY    Review of Systems  Constitutional: Positive for fatigue. Negative for fever and chills.  HENT: Negative.   Eyes: Negative for visual disturbance.  Respiratory: Positive for cough, chest tightness and shortness of breath. Negative for wheezing.   Cardiovascular: Negative for chest pain, palpitations and leg swelling.  Gastrointestinal: Negative for nausea, vomiting and abdominal pain.  Neurological: Negative for dizziness and headaches.  Psychiatric/Behavioral: Positive for sleep disturbance (she has been having trouble sleeping for several years now.). Negative for dysphoric mood, decreased concentration and agitation. The patient is nervous/anxious.     Social History  Substance Use Topics  . Smoking status: Never Smoker   . Smokeless tobacco: Never Used  . Alcohol Use: Yes     Comment: 1 glass of wine + 1 margarita per week   Objective:   BP 110/78 mmHg  Pulse 65  Temp(Src) 98.5 F (36.9 C) (Oral)  Resp 16  Wt 141 lb 3.2 oz (64.048 kg)  Physical Exam  Constitutional: She appears well-developed and well-nourished. No distress.  HENT:  Head: Normocephalic and atraumatic.  Right Ear: Hearing, tympanic membrane, external ear and ear canal normal.  Left Ear: Hearing, tympanic  membrane, external ear and ear canal normal.  Nose: Nose normal. Right sinus exhibits no maxillary sinus tenderness and no frontal sinus tenderness. Left sinus exhibits no maxillary sinus tenderness and no frontal sinus tenderness.  Mouth/Throat: Uvula is midline, oropharynx is clear and moist and mucous membranes are normal. No oropharyngeal exudate, posterior oropharyngeal edema or posterior oropharyngeal erythema.  Eyes: Conjunctivae are normal. Pupils are equal, round, and reactive to light. Right eye exhibits no discharge. Left  eye exhibits no discharge. No scleral icterus.  Neck: Normal range of motion. Neck supple. No JVD present. No tracheal deviation present. No thyromegaly present.  Cardiovascular: Normal rate, regular rhythm and normal heart sounds.  Exam reveals no gallop and no friction rub.   No murmur heard. Pulmonary/Chest: Effort normal. No accessory muscle usage or stridor. No respiratory distress. She has decreased breath sounds. She has wheezes (insp/exp throughout). She has no rhonchi. She has no rales.  Musculoskeletal: She exhibits no edema.  Lymphadenopathy:    She has no cervical adenopathy.  Skin: Skin is warm and dry. She is not diaphoretic.  Vitals reviewed.       Assessment & Plan:     1. Asthmatic bronchitis, mild intermittent, with acute exacerbation Will have her start albuterol inhaler. She states she has one at home that she will begin. She is to use this every 6-8 hours x 1-2 weeks as needed. If cough persists she is to call the office and return for spirometry and CXR to r/o any other cause of wheezing and cough.        Mar Daring, PA-C  [facility]

## 2016-05-11 ENCOUNTER — Encounter: Payer: Self-pay | Admitting: Physician Assistant

## 2016-05-11 ENCOUNTER — Ambulatory Visit (INDEPENDENT_AMBULATORY_CARE_PROVIDER_SITE_OTHER): Payer: BLUE CROSS/BLUE SHIELD | Admitting: Physician Assistant

## 2016-05-11 VITALS — BP 122/80 | HR 56 | Temp 98.4°F | Resp 18 | Wt 141.4 lb

## 2016-05-11 DIAGNOSIS — J4521 Mild intermittent asthma with (acute) exacerbation: Secondary | ICD-10-CM

## 2016-05-11 MED ORDER — PREDNISONE 10 MG (21) PO TBPK: ORAL_TABLET | ORAL | Status: DC

## 2016-05-11 MED ORDER — LEVOFLOXACIN 500 MG PO TABS: 500.0000 mg | ORAL_TABLET | Freq: Every day | ORAL | Status: DC

## 2016-05-11 NOTE — Progress Notes (Signed)
Patient: Barbara Gates Female    DOB: [birth year]   65 y.o.   MRN: [id number] Visit Date: 05/11/2016  Today's Provider: Mar Daring, PA-C   Chief Complaint  Patient presents with  . Cough   Subjective:    HPI  Barbara Gates is here today with c/o cough. She was seen 06/14 for a persistent cough off and on since February. She reported that gets a cough when she is rolling in her bed, takes a deep breath or gets in her car. She also reports that almost always when she gets in her car she feels and notice the cough or feeling of sensation to cough.She was diagnosed on 06/14 with asthmatic bronchitis, mild intermittent with acute exacerbation.She is feeling very tired and feels really bad.She reports that she went for a walk with her husband and walk for a mile and once she got to the car she was having chest tightness and SOB. She being using the inhaler since she saw [place]. Feels like like the inhaler flares her symptoms more.     No Known Allergies Current Meds  Medication Sig  . aspirin 81 MG tablet Take 81 mg by mouth daily.  Marland Kitchen CALCIUM-VITAMIN D PO Take 300 mg by mouth every morning.  . Carboxymethylcellul-Glycerin (LUBRICATING EYE DROPS OP) Apply to eye 2 (two) times daily.  Marland Kitchen ibuprofen (ADVIL,MOTRIN) 200 MG tablet Take 200 mg by mouth every 6 (six) hours as needed.  . meloxicam (MOBIC) 15 MG tablet Take 1 tablet (15 mg total) by mouth daily.  . Multiple Vitamin (MULTIVITAMIN) tablet Take 1 tablet by mouth daily.  . Multiple Vitamins-Minerals (EYE VITAMINS) CAPS Take by mouth daily.  . Phosphatidylserine-DHA-EPA (VAYACOG) 100-19.5-6.5 MG CAPS Take 1 capsule by mouth daily.  . Polyethylene Glycol 3350 (MIRALAX PO) Take by mouth 4 (four) times a week.  . tamoxifen (NOLVADEX) 20 MG tablet TAKE 1 TABLET BY MOUTH EVERY DAY  . [DISCONTINUED] polyethylene glycol powder (MIRALAX) powder Take 1 Container by mouth daily.     Review of Systems  Constitutional:  Positive for fever and fatigue. Negative for chills.  HENT: Positive for congestion.   Respiratory: Positive for cough, chest tightness, shortness of breath and wheezing.   Cardiovascular: Negative for chest pain, palpitations and leg swelling.  Gastrointestinal: Negative for nausea and abdominal pain.  Neurological: Negative for dizziness and headaches.    Social History  Substance Use Topics  . Smoking status: Never Smoker   . Smokeless tobacco: Never Used  . Alcohol Use: Yes     Comment: 1 glass of wine + 1 margarita per week   Objective:   BP 122/80 mmHg  Pulse 56  Temp(Src) 98.4 F (36.9 C) (Oral)  Resp 18  Wt 141 lb 6.4 oz (64.139 kg)  SpO2 97%  Physical Exam  Constitutional: She appears well-developed and well-nourished. No distress.  HENT:  Head: Normocephalic and atraumatic.  Right Ear: Hearing, tympanic membrane, external ear and ear canal normal.  Left Ear: Hearing, tympanic membrane, external ear and ear canal normal.  Nose: Nose normal.  Mouth/Throat: Uvula is midline and mucous membranes are normal. Posterior oropharyngeal erythema present. No oropharyngeal exudate or posterior oropharyngeal edema.  Eyes: Conjunctivae are normal. Pupils are equal, round, and reactive to light. Right eye exhibits no discharge. Left eye exhibits no discharge. No scleral icterus.  Neck: Normal range of motion. Neck supple. No tracheal deviation present. No thyromegaly present.  Cardiovascular: Normal rate, regular rhythm and  normal heart sounds.  Exam reveals no gallop and no friction rub.   No murmur heard. Pulmonary/Chest: Effort normal and breath sounds normal. No stridor. No respiratory distress. She has no wheezes. She has no rales.  Lymphadenopathy:    She has no cervical adenopathy.  Skin: Skin is warm and dry. She is not diaphoretic.  Vitals reviewed.       Assessment & Plan:     1. Asthmatic bronchitis, mild intermittent, with acute exacerbation No wheezes heard  today but coughed with any deep breathing. Will give levaquin and prednisone taper as below. If still no improvement following treatment will get CXR and spirometry.  - levofloxacin (LEVAQUIN) 500 MG tablet; Take 1 tablet (500 mg total) by mouth daily.  Dispense: 10 tablet; Refill: 0 - predniSONE (STERAPRED UNI-PAK 21 TAB) 10 MG (21) TBPK tablet; Take as directed on package directions  Dispense: 21 tablet; Refill: 0       Mar Daring, PA-C  [facility]

## 2016-05-11 NOTE — Patient Instructions (Signed)
Bronchitis Chronic bronchitis is a lasting inflammation of the bronchial tubes, which are the tubes that carry air into your lungs. This is inflammation that occurs:   On most days of the week.   For at least three months at a time.   Over a period of two years in a row. When the bronchial tubes are inflamed, they start to produce mucus. The inflammation and buildup of mucus make it more difficult to breathe. Chronic bronchitis is usually a permanent problem and is one type of chronic obstructive pulmonary disease (COPD). People with chronic bronchitis are at greater risk for getting repeated colds, or respiratory infections. CAUSES  Chronic bronchitis most often occurs in people who have:  Long-standing, severe asthma.  A history of smoking.  Asthma and who also smoke. SIGNS AND SYMPTOMS  Chronic bronchitis may cause the following:   A cough that brings up mucus (productive cough).  Shortness of breath.  Early morning headache.  Wheezing.  Chest discomfort.   Recurring respiratory infections. DIAGNOSIS  Your health care provider may confirm the diagnosis by:  Taking your medical history.  Performing a physical exam.  Taking a chest X-ray.   Performing pulmonary function tests. TREATMENT  Treatment involves controlling symptoms with medicines, oxygen therapy, or making lifestyle changes, such as exercising and eating a healthy, well-balanced diet. Medicines could include:  Inhalers to improve air flow in and out of your lungs.  Antibiotics to treat bacterial infections, such as pneumonia, sinus infections, and acute bronchitis. As a preventative measure, your health care provider may recommend routine vaccinations for influenza and pneumonia. This is to prevent infection and hospitalization since you may be more at risk for these types of infections.  HOME CARE INSTRUCTIONS  Take medicines only as directed by your health care provider.   If you smoke  cigarettes, chew tobacco, or use electronic cigarettes, quit. If you need help quitting, ask your health care provider.  Avoid pollen, dust, animal dander, molds, smoke, and other things that cause shortness of breath or wheezing attacks.  Talk to your health care provider about possible exercise routines. Regular exercise is very important to help you feel better.  If you are prescribed oxygen use at home follow these guidelines:  Never smoke while using oxygen. Oxygen does not burn or explode, but flammable materials will burn faster in the presence of oxygen.  Keep a Data processing manager close by. Let your fire department know that you have oxygen in your home.  Warn visitors not to smoke near you when you are using oxygen. Put up "no smoking" signs in your home where you most often use the oxygen.  Regularly test your smoke detectors at home to make sure they work. If you receive care in your home from a nurse or other health care provider, he or she may also check to make sure your smoke detectors work.  Ask your health care provider whether you would benefit from a pulmonary rehabilitation program.  Do not wait to get medical care if you have any concerning symptoms. Delays could cause permanent injury and may be life threatening. SEEK MEDICAL CARE IF:  You have increased coughing or shortness of breath or both.  You have muscle aches.  You have chest pain.  Your mucus gets thicker.  Your mucus changes from clear or white to yellow, green, gray, or bloody. SEEK IMMEDIATE MEDICAL CARE IF:  Your usual medicines do not stop your wheezing.   You have increased difficulty breathing.  You have any problems with the medicine you are taking, such as a rash, itching, swelling, or trouble breathing. MAKE SURE YOU:   Understand these instructions.  Will watch your condition.  Will get help right away if you are not doing well or get worse.   This information is not intended to  replace advice given to you by your health care provider. Make sure you discuss any questions you have with your health care provider.   Document Released: 08/25/2006 Document Revised: 11/28/2014 Document Reviewed: 12/16/2013 Elsevier Interactive Patient Education [employer].

## 2016-05-16 ENCOUNTER — Ambulatory Visit
Admission: RE | Admit: 2016-05-16 | Discharge: 2016-05-16 | Disposition: A | Discharge status: Home / Self Care | Payer: BLUE CROSS/BLUE SHIELD | Source: Ambulatory Visit | Attending: Physician Assistant | Admitting: Physician Assistant

## 2016-05-16 ENCOUNTER — Telehealth: Payer: Self-pay | Admitting: Physician Assistant

## 2016-05-16 DIAGNOSIS — R05 Cough: Secondary | ICD-10-CM | POA: Insufficient documentation

## 2016-05-16 DIAGNOSIS — R059 Cough, unspecified: Secondary | ICD-10-CM

## 2016-05-16 NOTE — Telephone Encounter (Signed)
CXR ordered for [facility] outpatient imaging. Patient can go at her convenience. Will f/u pending results.

## 2016-05-16 NOTE — Telephone Encounter (Signed)
Pt states that her cough is no better since last office visit.Please advise

## 2016-05-16 NOTE — Telephone Encounter (Signed)
LM regarding that the chest xray was ordered if any questions or concerns to call back.  Thanks,  -Joseline

## 2016-05-17 NOTE — Telephone Encounter (Signed)
LMTCB  Thanks,  -Oaklee Sunga 

## 2016-05-17 NOTE — Telephone Encounter (Signed)
-----   Message from Mar Daring, [place] sent at 05/16/2016  3:43 PM EDT ----- CXR is normal. No cause noted for cough. If patient wishes can refer to pulmonology for further workup and evaluation.

## 2016-05-17 NOTE — Telephone Encounter (Signed)
Order placed

## 2016-05-17 NOTE — Telephone Encounter (Signed)
Pt advise and would like to be referred to pulmonology.  ED

## 2016-05-17 NOTE — Telephone Encounter (Signed)
Pt called back regarding the test results.  Please call [phone] (home) , cell isn't working properly.

## 2016-06-01 ENCOUNTER — Encounter: Payer: Self-pay | Admitting: Internal Medicine

## 2016-06-01 ENCOUNTER — Ambulatory Visit (INDEPENDENT_AMBULATORY_CARE_PROVIDER_SITE_OTHER): Payer: BLUE CROSS/BLUE SHIELD | Admitting: Internal Medicine

## 2016-06-01 VITALS — BP 122/74 | HR 64 | Ht 69.0 in | Wt 141.0 lb

## 2016-06-01 DIAGNOSIS — R0602 Shortness of breath: Secondary | ICD-10-CM | POA: Diagnosis not present

## 2016-06-01 DIAGNOSIS — R0600 Dyspnea, unspecified: Secondary | ICD-10-CM | POA: Insufficient documentation

## 2016-06-01 DIAGNOSIS — R05 Cough: Secondary | ICD-10-CM | POA: Diagnosis not present

## 2016-06-01 DIAGNOSIS — R053 Chronic cough: Secondary | ICD-10-CM

## 2016-06-01 DIAGNOSIS — R0609 Other forms of dyspnea: Secondary | ICD-10-CM | POA: Diagnosis not present

## 2016-06-01 DIAGNOSIS — R059 Cough, unspecified: Secondary | ICD-10-CM | POA: Insufficient documentation

## 2016-06-01 MED ORDER — FLUTICASONE FUROATE 100 MCG/ACT IN AEPB: 1.0000 | INHALATION_SPRAY | Freq: Every day | RESPIRATORY_TRACT | Status: AC

## 2016-06-01 NOTE — Assessment & Plan Note (Signed)
Her history, this seems to be a mild chronic symptom that has persisted from early adulthood. Worse with incline, uphill exercises.  Plan: -Pulmonary function testing and 6 minute walk test

## 2016-06-01 NOTE — Progress Notes (Signed)
[facility] Consultation    Date: 06/01/2016  MRN# [id number] Barbara [facility] 04/05/1951  Referring Physician: Dr. Marlyn Corporal PMD - Dr. Lurene Shadow Barbara Gates is a 65 y.o. old female seen in consultation for chronic cough  CC:  Chief Complaint  Patient presents with  . pulmonary consult    pt ref by Dr. Marlyn Corporal. pt c/o non prod cough & increased sob w/exertion X6mo PCP started her on ventolin. pt felt ventolin made cough worse. recently finished prednisone & levaquin with no improvement.     HPI:  Patient is a pleasant 65 year old female seen in consultation today for chronic cough. Cough is been ongoing for the past 5 months. Onset started after a recent upper respiratory tract infection, which patient states was viral, lasted about one week. Cough is nonproductive, barking-like, worse with exhalation and in hot environments. Therapies tried without improvement include albuterol trial/Ventolin trial, which actually made coughing worse per patient. She also was placed on a course of prednisone and Levaquin without any significant improvement. She is a never smoker. Previously worked at [employer], no exposure to industrial metals or chronic dust or [employer]. Patient has 2 indoor cats at home over the past 4 years with no prior issues. Denies any childhood asthma or significant secondhand smoke. She does have a copy of her vaccines, her Tdap was last done in 2011. Patient has not had any previous episodes of coughing like this. Patient doesn't endorse shortness of breath with incline and going uphill. She states she is active, with walking couple miles per week and bike riding. However, going uphill induces dyspnea.  PMHX:   Past Medical History  Diagnosis Date  . Osteoporosis   . Hyperlipidemia     Has history of this  . Hypercalcemia    Surgical Hx:  Past Surgical History  Procedure Laterality Date  . Surgical lipoma removal; 12/13/2012   12/13/2012  . Abdominal hysterectomy  1994    Total including ovarie. Due to endometriosis  . Breast biopsy Left 11/08/2012    Dr. Bary Castilla, neg   Family Hx:  Family History  Problem Relation Age of Onset  . Cancer Mother     lung, pancreas, melanoma, carcinoid tumors  . Hypertension Mother   . Hyperlipidemia Mother   . Diabetes Mother   . Cancer Sister     breast  . Breast cancer Sister 36  . Hypertension Father   . Diabetes Father   . Hyperlipidemia Father   . Parkinson's disease Father   . CAD Father   . Diabetes Brother   . Hypertension Brother    Social Hx:   Social History  Substance Use Topics  . Smoking status: Never Smoker   . Smokeless tobacco: Never Used  . Alcohol Use: Yes     Comment: 1 glass of wine + 1 margarita per week   Medication:   Current Outpatient Rx  Name  Route  Sig  Dispense  Refill  . aspirin 81 MG tablet   Oral   Take 81 mg by mouth daily.         Marland Kitchen CALCIUM-VITAMIN D PO   Oral   Take 300 mg by mouth every morning.         . Carboxymethylcellul-Glycerin (LUBRICATING EYE DROPS OP)   Ophthalmic   Apply to eye 2 (two) times daily.         Marland Kitchen ibuprofen (ADVIL,MOTRIN) 200 MG tablet   Oral   Take 200 mg by mouth  every 6 (six) hours as needed.         . meloxicam (MOBIC) 15 MG tablet   Oral   Take 1 tablet (15 mg total) by mouth daily.   90 tablet   1   . Multiple Vitamin (MULTIVITAMIN) tablet   Oral   Take 1 tablet by mouth daily.         . Multiple Vitamins-Minerals (EYE VITAMINS) CAPS   Oral   Take by mouth daily.         . Phosphatidylserine-DHA-EPA (VAYACOG) 100-19.5-6.5 MG CAPS   Oral   Take 1 capsule by mouth daily.   90 capsule   1   . Polyethylene Glycol 3350 (MIRALAX PO)   Oral   Take by mouth 4 (four) times a week.         . tamoxifen (NOLVADEX) 20 MG tablet      TAKE 1 TABLET BY MOUTH EVERY DAY   90 tablet   3       Allergies:  Review of patient's allergies indicates no known  allergies.  Review of Systems  Constitutional: Negative for fever and chills.  Eyes: Negative for blurred vision.  Respiratory: Positive for cough and shortness of breath. Negative for hemoptysis, sputum production and wheezing.   Gastrointestinal: Negative for heartburn.  Genitourinary: Negative for dysuria.  Musculoskeletal: Negative for myalgias.  Skin: Negative for rash.  Neurological: Negative for dizziness and headaches.  Endo/Heme/Allergies: Does not bruise/bleed easily.  Psychiatric/Behavioral: Negative for depression.     Physical Examination:   VS: BP 122/74 mmHg  Pulse 64  Ht 5\' 9"  (1.753 m)  Wt 141 lb (63.957 kg)  BMI 20.81 kg/m2  SpO2 97%  General Appearance: No distress  Neuro:without focal findings, mental status, speech normal, alert and oriented, cranial nerves 2-12 intact, reflexes normal and symmetric, sensation grossly normal  HEENT: PERRLA, EOM intact, no ptosis, no other lesions noticed; Mallampati 1 Pulmonary: normal breath sounds., diaphragmatic excursion normal.No wheezing, No rales;   Sputum Production:   CardiovascularNormal S1,S2.  No m/r/g.  Abdominal aorta pulsation normal.    Abdomen: Benign, Soft, non-tender, No masses, hepatosplenomegaly, No lymphadenopathy Renal:  No costovertebral tenderness  GU:  No performed at this time. Endoc: No evident thyromegaly, no signs of acromegaly or Cushing features Skin:   warm, no rashes, no ecchymosis  Extremities: normal, no cyanosis, clubbing, no edema, warm with normal capillary refill. Other findings:none     Rad results: (The following images and results were reviewed by Dr. Stevenson Clinch on 06/01/2016). CXR 04/2016 EXAM: CHEST 2 VIEW  COMPARISON: None.  FINDINGS: The heart size and mediastinal contours are within normal limits. Both lungs are clear. The visualized skeletal structures are unremarkable. Mild apical scarring bilaterally.  IMPRESSION: No active cardiopulmonary disease.     Assessment and Plan: 65 year old female seen in consultation for chronic cough Chronic cough The standardized cough guidelines published in Chest by Lissa Morales in 2006 are still the best available and consist of a multiple step process (up to 12!) , not a single office visit,  and are intended  to address this problem logically,  with an alogrithm dependent on response to empiric treatment at  each progressive step  to determine a specific diagnosis with  minimal addtional testing needed. Therefore if adherence is an issue or can't be accurately verified,  it's very unlikely the standard evaluation and treatment will be successful here.    Furthermore, response to therapy (other than acute cough suppression, which should  only be used short term with avoidance of narcotic containing cough syrups if possible), can be a gradual process for which the patient may perceive immediate benefit.  Unlike going to an eye doctor where the best perscription is almost always the first one and is immediately effective, this is almost never the case in the management of chronic cough syndromes. Therefore the patient needs to commit up front to consistently adhere to recommendations  for up to 6 weeks of therapy directed at the likely underlying problem(s) before the response can be reasonably evaluated.  At this time she could have an exaggerated postinfectious cough, or adult onset asthma as a result of her recurrent URI over the past year.  Plan: -Pulmonary function testing and 6 minute walk test prior to follow-up visit -Trial of inhaled corticosteroid, Arnuity given.  Patient has improvement will then given Rx, if this inhaler is ineffective or make symptoms worse, we can try Qvar.    DOE (dyspnea on exertion) Her history, this seems to be a mild chronic symptom that has persisted from early adulthood. Worse with incline, uphill exercises.  Plan: -Pulmonary function testing and 6 minute walk  test    Updated Medication List Outpatient Encounter Prescriptions as of 06/01/2016  Medication Sig  . aspirin 81 MG tablet Take 81 mg by mouth daily.  Marland Kitchen CALCIUM-VITAMIN D PO Take 300 mg by mouth every morning.  . Carboxymethylcellul-Glycerin (LUBRICATING EYE DROPS OP) Apply to eye 2 (two) times daily.  Marland Kitchen ibuprofen (ADVIL,MOTRIN) 200 MG tablet Take 200 mg by mouth every 6 (six) hours as needed.  . meloxicam (MOBIC) 15 MG tablet Take 1 tablet (15 mg total) by mouth daily.  . Multiple Vitamin (MULTIVITAMIN) tablet Take 1 tablet by mouth daily.  . Multiple Vitamins-Minerals (EYE VITAMINS) CAPS Take by mouth daily.  . Phosphatidylserine-DHA-EPA (VAYACOG) 100-19.5-6.5 MG CAPS Take 1 capsule by mouth daily.  . Polyethylene Glycol 3350 (MIRALAX PO) Take by mouth 4 (four) times a week.  . tamoxifen (NOLVADEX) 20 MG tablet TAKE 1 TABLET BY MOUTH EVERY DAY  . [DISCONTINUED] levofloxacin (LEVAQUIN) 500 MG tablet Take 1 tablet (500 mg total) by mouth daily. (Patient not taking: Reported on 06/01/2016)  . [DISCONTINUED] predniSONE (STERAPRED UNI-PAK 21 TAB) 10 MG (21) TBPK tablet Take as directed on package directions (Patient not taking: Reported on 06/01/2016)   No facility-administered encounter medications on file as of 06/01/2016.    Orders for this visit: Orders Placed This Encounter  Procedures  . Pulmonary function test    Standing Status: Future     Number of Occurrences:      Standing Expiration Date: 06/01/2017    Order Specific Question:  Where should this test be performed?    Answer:  [facility]    Order Specific Question:  Full PFT: includes the following: basic spirometry, spirometry pre & post bronchodilator, diffusion capacity (DLCO), lung volumes    Answer:  Full PFT  . 6 minute walk    Standing Status: Future     Number of Occurrences:      Standing Expiration Date: 06/01/2017    Order Specific Question:  Where should this test be performed?    Answer:  Other      Thank  you for the consultation and for allowing [facility] to assist in the care of your patient. Our recommendations are noted above.  Please contact us if we can be of further service.   Vilinda Boehringer, MD [facility] Office Number: [phone]  Note: This note was prepared with Dragon dictation along with smaller phrase technology. Any transcriptional errors that result from this process are unintentional.

## 2016-06-01 NOTE — Patient Instructions (Signed)
Follow up with Dr. Stevenson Clinch in: 2 months - Arnuity 18mcg- 1 puff daily -gargle and rinse after each use. Use in the AM.  If improvement after 2 weeks, then call us for an rx.  If worsening symptoms, then stop immediately - PFTs and 6 mwt prior to follow up visit.  -

## 2016-06-01 NOTE — Assessment & Plan Note (Signed)
The standardized cough guidelines published in Chest by Lissa Morales in 2006 are still the best available and consist of a multiple step process (up to 12!) , not a single office visit,  and are intended  to address this problem logically,  with an alogrithm dependent on response to empiric treatment at  each progressive step  to determine a specific diagnosis with  minimal addtional testing needed. Therefore if adherence is an issue or can't be accurately verified,  it's very unlikely the standard evaluation and treatment will be successful here.    Furthermore, response to therapy (other than acute cough suppression, which should only be used short term with avoidance of narcotic containing cough syrups if possible), can be a gradual process for which the patient may perceive immediate benefit.  Unlike going to an eye doctor where the best perscription is almost always the first one and is immediately effective, this is almost never the case in the management of chronic cough syndromes. Therefore the patient needs to commit up front to consistently adhere to recommendations  for up to 6 weeks of therapy directed at the likely underlying problem(s) before the response can be reasonably evaluated.  At this time she could have an exaggerated postinfectious cough, or adult onset asthma as a result of her recurrent URI over the past year.  Plan: -Pulmonary function testing and 6 minute walk test prior to follow-up visit -Trial of inhaled corticosteroid, Arnuity given.  Patient has improvement will then given Rx, if this inhaler is ineffective or make symptoms worse, we can try Qvar.

## 2016-06-01 NOTE — Progress Notes (Signed)
Patient ID: Barbara Gates, female   DOB: 06/21/1951, 65 y.o.   MRN: [id number] Patient seen in the office today and instructed on use of ARUNITY ELLIPTA Patient expressed understanding and demonstrated technique.

## 2016-06-06 ENCOUNTER — Telehealth: Payer: Self-pay | Admitting: Internal Medicine

## 2016-06-06 MED ORDER — AMBULATORY NON FORMULARY MEDICATION: Status: DC

## 2016-06-06 MED ORDER — FLUTTER DEVI: Status: DC

## 2016-06-06 NOTE — Telephone Encounter (Signed)
Pt states she did some research on arunity that she was given a sample of at [facility]. She has a hx of osteoporosis, research states arunity can cause damage to the bones. She would like to stop arunity. Last dose was yesterday, she's taken 4 out of 12 doses.  Pt also stated she would like to fix the cough and not just treat it with long term meds like arunity. Starting November she will be on medicare and wants to know if the ordered test can wait until then. VM please advise.

## 2016-06-06 NOTE — Telephone Encounter (Signed)
Tried to call pt but got VM. LMOM for pt to return call.

## 2016-06-06 NOTE — Telephone Encounter (Signed)
Spoke with patient. Has a hx of osteoporosis, does not want to use Arnuity (ICS).  D\C Arnuity  May use each one of these 10-15 times per day.  1. Flutter valve 2. Incentive spirometry.   Vilinda Boehringer, MD [facility] Pager [phone] (please enter 7-digits) On Call Pager - [phone] (please enter 7-digits)

## 2016-06-06 NOTE — Telephone Encounter (Signed)
Pt calling concerning her inhaler and that she has osteoporosis She would just like a call back did not want to give me more details as to what the concern was. Please call back

## 2016-06-06 NOTE — Telephone Encounter (Signed)
RXs printed and placed up front for pt to pick up. Nothing further needed.

## 2016-07-29 ENCOUNTER — Ambulatory Visit: Payer: BLUE CROSS/BLUE SHIELD

## 2016-08-01 ENCOUNTER — Ambulatory Visit: Payer: BLUE CROSS/BLUE SHIELD | Admitting: Internal Medicine

## 2016-10-04 DIAGNOSIS — H40003 Preglaucoma, unspecified, bilateral: Secondary | ICD-10-CM | POA: Diagnosis not present

## 2016-10-05 ENCOUNTER — Ambulatory Visit (INDEPENDENT_AMBULATORY_CARE_PROVIDER_SITE_OTHER): Payer: Medicare Other | Admitting: Physician Assistant

## 2016-10-05 ENCOUNTER — Ambulatory Visit
Admission: RE | Admit: 2016-10-05 | Discharge: 2016-10-05 | Disposition: A | Discharge status: Home / Self Care | Payer: Medicare Other | Source: Ambulatory Visit | Attending: Physician Assistant | Admitting: Physician Assistant

## 2016-10-05 ENCOUNTER — Ambulatory Visit: Payer: BLUE CROSS/BLUE SHIELD

## 2016-10-05 VITALS — BP 132/81 | HR 60 | Temp 97.6°F | Ht 68.75 in | Wt 141.5 lb

## 2016-10-05 DIAGNOSIS — Z114 Encounter for screening for human immunodeficiency virus [HIV]: Secondary | ICD-10-CM | POA: Diagnosis not present

## 2016-10-05 DIAGNOSIS — E7800 Pure hypercholesterolemia, unspecified: Secondary | ICD-10-CM | POA: Diagnosis not present

## 2016-10-05 DIAGNOSIS — M545 Low back pain: Secondary | ICD-10-CM | POA: Insufficient documentation

## 2016-10-05 DIAGNOSIS — M818 Other osteoporosis without current pathological fracture: Secondary | ICD-10-CM | POA: Diagnosis not present

## 2016-10-05 DIAGNOSIS — R6889 Other general symptoms and signs: Secondary | ICD-10-CM | POA: Diagnosis not present

## 2016-10-05 DIAGNOSIS — Z0000 Encounter for general adult medical examination without abnormal findings: Secondary | ICD-10-CM

## 2016-10-05 DIAGNOSIS — G8929 Other chronic pain: Secondary | ICD-10-CM | POA: Diagnosis not present

## 2016-10-05 NOTE — Patient Instructions (Signed)

## 2016-10-05 NOTE — Patient Instructions (Signed)
Barbara Gates , Thank you for taking time to come for your Medicare Wellness Visit. I appreciate your ongoing commitment to your health goals. Please review the following plan we discussed and let me know if I can assist you in the future.   These are the goals we discussed: Goals    . Increase water intake          Starting 10/05/16, I will continue to drink 4 glasses of water a day.       This is a list of the screening recommended for you and due dates:  Health Maintenance  Topic Date Due  . HIV Screening  10/13/1966  . Pap Smear  10/05/2026*  .  Hepatitis C: One time screening is recommended by Center for Disease Control  (CDC) for  adults born from [birth year] through 1965.   10/05/2026*  . Mammogram  12/21/2017  . Tetanus Vaccine  09/02/2020  . Colon Cancer Screening  03/21/2024  . Flu Shot  Completed  . Shingles Vaccine  Completed  *Topic was postponed. The date shown is not the original due date.   Preventive Care for Adults  A healthy lifestyle and preventive care can promote health and wellness. Preventive health guidelines for adults include the following key practices.  . A routine yearly physical is a good way to check with your health care provider about your health and preventive screening. It is a chance to share any concerns and updates on your health and to receive a thorough exam.  . Visit your dentist for a routine exam and preventive care every 6 months. Brush your teeth twice a day and floss once a day. Good oral hygiene prevents tooth decay and gum disease.  . The frequency of eye exams is based on your age, health, family medical history, use  of contact lenses, and other factors. Follow your health care provider's ecommendations for frequency of eye exams.  . Eat a healthy diet. Foods like vegetables, fruits, whole grains, low-fat dairy products, and lean protein foods contain the nutrients you need without too many calories. Decrease your intake of foods high in  solid fats, added sugars, and salt. Eat the right amount of calories for you. Get information about a proper diet from your health care provider, if necessary.  . Regular physical exercise is one of the most important things you can do for your health. Most adults should get at least 150 minutes of moderate-intensity exercise (any activity that increases your heart rate and causes you to sweat) each week. In addition, most adults need muscle-strengthening exercises on 2 or more days a week.  Silver Sneakers may be a benefit available to you. To determine eligibility, you may visit the website: www.silversneakers.com or contact program at (716) 287-2929 Mon-Fri between 8AM-8PM.   . Maintain a healthy weight. The body mass index (BMI) is a screening tool to identify possible weight problems. It provides an estimate of body fat based on height and weight. Your health care provider can find your BMI and can help you achieve or maintain a healthy weight.   For adults 20 years and older: ? A BMI below 18.5 is considered underweight. ? A BMI of 18.5 to 24.9 is normal. ? A BMI of 25 to 29.9 is considered overweight. ? A BMI of 30 and above is considered obese.   . Maintain normal blood lipids and cholesterol levels by exercising and minimizing your intake of saturated fat. Eat a balanced diet with plenty of fruit  and vegetables. Blood tests for lipids and cholesterol should begin at age 38 and be repeated every 5 years. If your lipid or cholesterol levels are high, you are over 50, or you are at high risk for heart disease, you may need your cholesterol levels checked more frequently. Ongoing high lipid and cholesterol levels should be treated with medicines if diet and exercise are not working.  . If you smoke, find out from your health care provider how to quit. If you do not use tobacco, please do not start.  . If you choose to drink alcohol, please do not consume more than 2 drinks per day. One drink  is considered to be 12 ounces (355 mL) of beer, 5 ounces (148 mL) of wine, or 1.5 ounces (44 mL) of liquor.  . If you are 12-21 years old, ask your health care provider if you should take aspirin to prevent strokes.  . Use sunscreen. Apply sunscreen liberally and repeatedly throughout the day. You should seek shade when your shadow is shorter than you. Protect yourself by wearing long sleeves, pants, a wide-brimmed hat, and sunglasses year round, whenever you are outdoors.  . Once a month, do a whole body skin exam, using a mirror to look at the skin on your back. Tell your health care provider of new moles, moles that have irregular borders, moles that are larger than a pencil eraser, or moles that have changed in shape or color.

## 2016-10-05 NOTE — Progress Notes (Signed)
Subjective:   Barbara Gates is a 65 y.o. female who presents for Medicare Annual (Subsequent) preventive examination.  Review of Systems:  N/A  Cardiac Risk Factors include: advanced age (>64men, >78 women)     Objective:     Vitals: BP 132/81 (BP Location: Right Arm)   Pulse 60   Temp 97.6 F (36.4 C) (Oral)   Ht 5' 8.75" (1.746 m)   Wt 141 lb 8 oz (64.2 kg)   BMI 21.05 kg/m   Body mass index is 21.05 kg/m.   Tobacco History  Smoking Status  . Never Smoker  Smokeless Tobacco  . Never Used     Counseling given: Not Answered   Past Medical History:  Diagnosis Date  . Hypercalcemia   . Hyperlipidemia    Has history of this  . Osteoporosis    Past Surgical History:  Procedure Laterality Date  . ABDOMINAL HYSTERECTOMY  1994   Total including ovarie. Due to endometriosis  . BREAST BIOPSY Left 11/08/2012   Dr. Bary Castilla, neg  . Surgical Lipoma Removal; 12/13/2012  12/13/2012   Family History  Problem Relation Age of Onset  . Cancer Mother     lung, pancreas, melanoma, carcinoid tumors  . Hypertension Mother   . Hyperlipidemia Mother   . Diabetes Mother   . Hypertension Father   . Diabetes Father   . Hyperlipidemia Father   . Parkinson's disease Father   . CAD Father   . Diabetes Brother   . Hypertension Brother   . Breast cancer Sister 41  . Arthritis Sister   . Hypertension Sister    History  Sexual Activity  . Sexual activity: Not on file    Outpatient Encounter Prescriptions as of 10/05/2016  Medication Sig  . CALCIUM-VITAMIN D PO Take 300 mg by mouth every morning.  . Carboxymethylcellul-Glycerin (LUBRICATING EYE DROPS OP) Apply to eye.   . Homeopathic Products (MENTAL CLARITY IN) Take 1 tablet by mouth 2 (two) times daily.  . Magnesium 400 MG CAPS Take 1 tablet by mouth daily.  . Multiple Vitamin (MULTIVITAMIN) tablet Take 1 tablet by mouth daily.  . Multiple Vitamins-Minerals (EYE VITAMINS) CAPS Take by mouth daily.  .  Polyethylene Glycol 3350 (MIRALAX PO) Take by mouth 4 (four) times a week.  . AMBULATORY NON FORMULARY MEDICATION Medication Name: Incentive Spirometry  Use 10-15 times daily (Patient not taking: Reported on 10/05/2016)  . aspirin 81 MG tablet Take 81 mg by mouth daily.  Marland Kitchen ibuprofen (ADVIL,MOTRIN) 200 MG tablet Take 200 mg by mouth every 6 (six) hours as needed.  . meloxicam (MOBIC) 15 MG tablet Take 1 tablet (15 mg total) by mouth daily. (Patient not taking: Reported on 10/05/2016)  . Phosphatidylserine-DHA-EPA (VAYACOG) 100-19.5-6.5 MG CAPS Take 1 capsule by mouth daily. (Patient not taking: Reported on 10/05/2016)  . Respiratory Therapy Supplies (FLUTTER) DEVI Use 10-15 times daily (Patient not taking: Reported on 10/05/2016)  . tamoxifen (NOLVADEX) 20 MG tablet TAKE 1 TABLET BY MOUTH EVERY DAY (Patient not taking: Reported on 10/05/2016)   No facility-administered encounter medications on file as of 10/05/2016.     Activities of Daily Living In your present state of health, do you have any difficulty performing the following activities: 10/05/2016  Hearing? Y  Vision? N  Difficulty concentrating or making decisions? Y  Walking or climbing stairs? Y  Dressing or bathing? N  Doing errands, shopping? N  Preparing Food and eating ? N  Using the Toilet? N  In the  past six months, have you accidently leaked urine? Y  Do you have problems with loss of bowel control? N  Managing your Medications? N  Managing your Finances? N  Housekeeping or managing your Housekeeping? N  Some recent data might be hidden    Patient Care Team: Mar Daring, PA-C as PCP - General (Family Medicine) Ronnell Freshwater, MD as Referring Physician (Ophthalmology) Brendolyn Patty, MD as Consulting Physician (Dermatology) Vilinda Boehringer, MD as Consulting Physician (Internal Medicine)    Assessment:     Exercise Activities and Dietary recommendations Current Exercise Habits: Home exercise  routine, Type of exercise: walking (1.5 miles at a time), Time (Minutes): 30, Frequency (Times/Week): 2, Weekly Exercise (Minutes/Week): 60, Intensity: Mild  Goals    . Increase water intake          Starting 10/05/16, I will continue to drink 4 glasses of water a day.      Fall Risk Fall Risk  10/05/2016  Falls in the past year? Yes  Number falls in past yr: 2 or more  Injury with Fall? No   Depression Screen PHQ 2/9 Scores 10/05/2016  PHQ - 2 Score 0     Cognitive Function     6CIT Screen 10/05/2016  What Year? 0 points  What month? 0 points  What time? 0 points  Count back from 20 0 points  Months in reverse 0 points  Repeat phrase 0 points  Total Score 0    Immunization History  Administered Date(s) Administered  . Influenza,inj,Quad PF,36+ Mos 12/07/2015  . Tdap 09/02/2010  . Zoster 02/05/2014   Screening Tests Health Maintenance  Topic Date Due  . HIV Screening  10/13/1966  . PAP SMEAR  10/05/2026 (Originally 10/13/1972)  . Hepatitis C Screening  10/05/2026 (Originally January 24, 1951)  . MAMMOGRAM  12/21/2017  . TETANUS/TDAP  09/02/2020  . COLONOSCOPY  03/21/2024  . INFLUENZA VACCINE  Completed  . ZOSTAVAX  Completed      Plan:  I have personally reviewed and addressed the Medicare Annual Wellness questionnaire and have noted the following in the patient's chart:  A. Medical and social history B. Use of alcohol, tobacco or illicit drugs  C. Current medications and supplements D. Functional ability and status E.  Nutritional status F.  Physical activity G. Advance directives H. List of other physicians I.  Hospitalizations, surgeries, and ER visits in previous 12 months J.  [place] such as hearing and vision if needed, cognitive and depression L. Referrals and appointments - none  In addition, I have reviewed and discussed with patient certain preventive protocols, quality metrics, and best practice recommendations. A written  personalized care plan for preventive services as well as general preventive health recommendations were provided to patient.  See attached scanned questionnaire for additional information.   Signed,  Fabio Neighbors, LPN Nurse Health Advisor   Physician to follow up on HIV screening and flu shot once turning 65 years old.  I have reviewed the documentation and information obtained by Fabio Neighbors, LPN in the above chart and agree as above. I was available for consultation if any questions or issues arose.  Fenton Malling, PA-C

## 2016-10-05 NOTE — Progress Notes (Signed)
Patient: Barbara Gates, Female    DOB: [birth year], 65 y.o.   MRN: [id number] Visit Date: 10/05/2016  Today's Provider: Mar Daring, PA-C   Chief Complaint  Patient presents with  . Annual Exam   Subjective:    Annual physical exam Barbara Gates is a 65 y.o. female who presents today for health maintenance and complete physical. She feels fairly well. She reports exercising some, walking 1-2 miles per week. She reports she is sleeping fairly well, noticing improvements.  Colonoscopy 2015: redundant colon; using Miralax M-W-F and one weekend day. 10 yr repeat Mammogram: 12/22/15; Normal; Benign:1, repeat one year. BMD 12/09/15: Osteopenia; Osteoporosis in 2014; was on Tamoxifen but discontinued on her own due to cough. -----------------------------------------------------------------   Review of Systems  Constitutional: Negative.   HENT: Negative.   Eyes: Negative.   Respiratory: Positive for cough (improving).   Cardiovascular: Negative.   Gastrointestinal: Positive for blood in stool (occasional fissures due to constipation) and constipation (chronic). Negative for abdominal distention, abdominal pain, nausea and vomiting.  Endocrine: Positive for cold intolerance and heat intolerance. Negative for polydipsia, polyphagia and polyuria.  Genitourinary: Positive for enuresis, frequency and urgency. Negative for dysuria, flank pain, hematuria, menstrual problem, pelvic pain, vaginal bleeding, vaginal discharge and vaginal pain.  Musculoskeletal: Positive for arthralgias and back pain. Negative for gait problem, joint swelling, myalgias, neck pain and neck stiffness.  Skin: Negative.   Allergic/Immunologic: Negative.   Neurological: Negative.   Hematological: Negative.   Psychiatric/Behavioral: Negative.     Social History      She  reports that she has never smoked. She has never used smokeless tobacco. She reports that she drinks about 1.2 oz of  alcohol per week . She reports that she does not use drugs.       Social History   Social History  . Marital status: Married    Spouse name: N/A  . Number of children: N/A  . Years of education: N/A   Social History Main Topics  . Smoking status: Never Smoker  . Smokeless tobacco: Never Used  . Alcohol use 1.2 oz/week    1 Glasses of wine, 1 Shots of liquor per week     Comment: margarita  . Drug use: No  . Sexual activity: Not on file   Other Topics Concern  . Not on file   Social History Narrative  . No narrative on file    Past Medical History:  Diagnosis Date  . Hypercalcemia   . Hyperlipidemia    Has history of this  . Osteoporosis      Patient Active Problem List   Diagnosis Date Noted  . Chronic cough 06/01/2016  . DOE (dyspnea on exertion) 06/01/2016  . Hypercalcemia 12/30/2015  . Memory impairment of gradual onset 12/30/2015  . Abnormal blood chemistry 06/11/2015  . Abnormal finding on mammography 06/11/2015  . Adult idiopathic generalized osteoporosis 06/11/2015  . Bradycardia by electrocardiogram 06/11/2015  . Colon polyp 06/11/2015  . Closed fracture of proximal end of radius 06/11/2015  . Auditory impairment 06/11/2015  . Blood in the urine 06/11/2015  . Calcium blood increased 06/11/2015  . Hypercholesteremia 06/11/2015  . Gonalgia 06/11/2015  . LBP (low back pain) 06/11/2015  . Herpes zona 06/11/2015  . Female genuine stress incontinence 06/11/2015  . Chronic constipation 09/02/2014  . Lipoma of back-left 07/18/2012    Past Surgical History:  Procedure Laterality Date  . ABDOMINAL HYSTERECTOMY  1994   Total  including ovarie. Due to endometriosis  . BREAST BIOPSY Left 11/08/2012   Dr. Bary Castilla, neg  . Surgical Lipoma Removal; 12/13/2012  12/13/2012    Family History        Family Status  Relation Status  . Mother Deceased  . Father Deceased  . Brother Alive  . Sister Alive        Her family history includes Arthritis in her  sister; Breast cancer (age of onset: 43) in her sister; CAD in her father; Cancer in her mother; Diabetes in her brother, father, and mother; Hyperlipidemia in her father and mother; Hypertension in her brother, father, mother, and sister; Parkinson's disease in her father.     No Known Allergies   Current Outpatient Prescriptions:  .  CALCIUM-VITAMIN D PO, Take 300 mg by mouth every morning., Disp: , Rfl:  .  Carboxymethylcellul-Glycerin (LUBRICATING EYE DROPS OP), Apply to eye. , Disp: , Rfl:  .  Homeopathic Products (MENTAL CLARITY IN), Take 1 tablet by mouth 2 (two) times daily., Disp: , Rfl:  .  Magnesium 400 MG CAPS, Take 1 tablet by mouth daily., Disp: , Rfl:  .  Multiple Vitamin (MULTIVITAMIN) tablet, Take 1 tablet by mouth daily., Disp: , Rfl:  .  Multiple Vitamins-Minerals (EYE VITAMINS) CAPS, Take by mouth daily., Disp: , Rfl:  .  Polyethylene Glycol 3350 (MIRALAX PO), Take by mouth 4 (four) times a week., Disp: , Rfl:  .  ibuprofen (ADVIL,MOTRIN) 200 MG tablet, Take 200 mg by mouth every 6 (six) hours as needed., Disp: , Rfl:    Patient Care Team: Mar Daring, PA-C as PCP - General (Family Medicine) Ronnell Freshwater, MD as Referring Physician (Ophthalmology) Brendolyn Patty, MD as Consulting Physician (Dermatology) Vilinda Boehringer, MD as Consulting Physician (Internal Medicine)      Objective:   Vitals: Vitals: BP 132/81 (BP Location: Right Arm)   Pulse 60   Temp 97.6 F (36.4 C) (Oral)   Ht 5' 8.75" (1.746 m)   Wt 141 lb 8 oz (64.2 kg)   BMI 21.05 kg/m   Body mass index is 21.05 kg/m.  Physical Exam  Constitutional: She is oriented to person, place, and time. She appears well-developed and well-nourished. No distress.  HENT:  Head: Normocephalic and atraumatic.  Right Ear: Hearing, tympanic membrane, external ear and ear canal normal.  Left Ear: Hearing, tympanic membrane, external ear and ear canal normal.  Nose: Nose normal.  Mouth/Throat: Uvula is  midline, oropharynx is clear and moist and mucous membranes are normal. No oropharyngeal exudate.  Eyes: Conjunctivae and EOM are normal. Pupils are equal, round, and reactive to light. Right eye exhibits no discharge. Left eye exhibits no discharge. No scleral icterus.  Neck: Normal range of motion. Neck supple. No JVD present. Carotid bruit is not present. No tracheal deviation present. No thyromegaly present.  Cardiovascular: Normal rate, regular rhythm, normal heart sounds and intact distal pulses.  Exam reveals no gallop and no friction rub.   No murmur heard. Pulmonary/Chest: Effort normal and breath sounds normal. No respiratory distress. She has no wheezes. She has no rales. She exhibits no tenderness.  Abdominal: Soft. Bowel sounds are normal. She exhibits no distension and no mass. There is no tenderness. There is no rebound and no guarding.  Musculoskeletal: Normal range of motion. She exhibits no edema or tenderness.  Lymphadenopathy:    She has no cervical adenopathy.  Neurological: She is alert and oriented to person, place, and time.  Skin: Skin is  warm and dry. No rash noted. She is not diaphoretic.  Psychiatric: She has a normal mood and affect. Her behavior is normal. Judgment and thought content normal.  Vitals reviewed.     Depression Screen PHQ 2/9 Scores 10/05/2016  PHQ - 2 Score 0      Assessment & Plan:     Routine Health Maintenance and Physical Exam  Exercise Activities and Dietary recommendations Goals    . Increase water intake          Starting 10/05/16, I will continue to drink 4 glasses of water a day.       Immunization History  Administered Date(s) Administered  . Influenza,inj,Quad PF,36+ Mos 12/07/2015  . Tdap 09/02/2010  . Zoster 02/05/2014    Health Maintenance  Topic Date Due  . HIV Screening  10/13/1966  . INFLUENZA VACCINE  10/13/2016 (Originally 06/21/2016)  . PAP SMEAR  10/05/2026 (Originally 10/13/1972)  . Hepatitis C  Screening  10/05/2026 (Originally January 12, 1951)  . MAMMOGRAM  12/21/2017  . TETANUS/TDAP  09/02/2020  . COLONOSCOPY  03/21/2024  . ZOSTAVAX  Completed     Discussed health benefits of physical activity, and encouraged her to engage in regular exercise appropriate for her age and condition.    1. Welcome to Medicare preventive visit EKG today normal sinus rhythm with a rate of 60. No ST elevation, PVCs or PACs noted. Medicare wellness information reviewed. - EKG 12-Lead  2. Chronic midline low back pain without sciatica Chronic low back pain and most often noted near the right SI joint without radiculopathy. Will obtain lumbar spine x-ray as below and follow-up pending results. - DG Lumbar Spine Complete; Future  3. Adult idiopathic generalized osteoporosis Patient with known osteoporosis diagnosed in 2014 after a forearm fracture. Has been on tamoxifen since diagnosis but read side effects that tamoxifen could cause a cough. Patient has been having a lingering dry cough since July of this she hair. Patient discontinued tamoxifen on her own after reading side effects. She does report the cough is improving. Will check labs as below and f/u pending results. She will return in 2 weeks to discuss further osteoporosis treatment and to discuss lab results. - Comprehensive Metabolic Panel (CMET) - Vitamin D (25 hydroxy)  4. Hypercholesteremia Will check labs as below and f/u pending results. - Lipid panel  5. Hypercalcemia Will check labs as below and f/u pending results. - Comprehensive Metabolic Panel (CMET)  6. Cold intolerance Will check labs as below and f/u pending results. - CBC w/Diff/Platelet - Thyroid Panel With TSH - Iron Binding Cap (TIBC) - Iron  7. Screening for HIV without presence of risk factors Low risk. Will check labs as below and f/u pending results. - HIV antibody (with reflex) --------------------------------------------------------------------    Mar Daring, PA-C  [facility]

## 2016-10-06 ENCOUNTER — Telehealth: Payer: Self-pay

## 2016-10-06 DIAGNOSIS — Z114 Encounter for screening for human immunodeficiency virus [HIV]: Secondary | ICD-10-CM | POA: Diagnosis not present

## 2016-10-06 DIAGNOSIS — M818 Other osteoporosis without current pathological fracture: Secondary | ICD-10-CM | POA: Diagnosis not present

## 2016-10-06 DIAGNOSIS — E7800 Pure hypercholesterolemia, unspecified: Secondary | ICD-10-CM | POA: Diagnosis not present

## 2016-10-06 DIAGNOSIS — R6889 Other general symptoms and signs: Secondary | ICD-10-CM | POA: Diagnosis not present

## 2016-10-06 NOTE — Telephone Encounter (Signed)
-----   Message from Mar Daring, PA-C sent at 10/06/2016  8:25 AM EST ----- Lumbar spine xray is unchanged from previous. Mild arthritic/degenerative changes in L5-S1 which is in the area you had tenderness yesterday. There is a large amount of stool noted in the colon indicating constipation, which you already know about. Continue Miralax. When you return we can discuss some newer medications that may be considered for your constipation if you are interested.

## 2016-10-06 NOTE — Telephone Encounter (Signed)
Patient was advised. KW 

## 2016-10-07 ENCOUNTER — Telehealth: Payer: Self-pay

## 2016-10-07 LAB — CBC WITH DIFFERENTIAL/PLATELET
Basophils Absolute: 0 10*3/uL (ref 0.0–0.2)
Basos: 0 %
EOS (ABSOLUTE): 0.1 10*3/uL (ref 0.0–0.4)
Eos: 1 %
Hematocrit: 38 % (ref 34.0–46.6)
Hemoglobin: 12.8 g/dL (ref 11.1–15.9)
Immature Grans (Abs): 0 10*3/uL (ref 0.0–0.1)
Immature Granulocytes: 0 %
Lymphocytes Absolute: 2.4 10*3/uL (ref 0.7–3.1)
Lymphs: 51 %
MCH: 30.3 pg (ref 26.6–33.0)
MCHC: 33.7 g/dL (ref 31.5–35.7)
MCV: 90 fL (ref 79–97)
Monocytes Absolute: 0.4 10*3/uL (ref 0.1–0.9)
Monocytes: 7 %
Neutrophils Absolute: 1.9 10*3/uL (ref 1.4–7.0)
Neutrophils: 41 %
Platelets: 220 10*3/uL (ref 150–379)
RBC: 4.23 x10E6/uL (ref 3.77–5.28)
RDW: 12.6 % (ref 12.3–15.4)
WBC: 4.8 10*3/uL (ref 3.4–10.8)

## 2016-10-07 LAB — COMPREHENSIVE METABOLIC PANEL
ALT: 13 IU/L (ref 0–32)
AST: 20 IU/L (ref 0–40)
Albumin/Globulin Ratio: 1.7 (ref 1.2–2.2)
Albumin: 4.3 g/dL (ref 3.6–4.8)
Alkaline Phosphatase: 54 IU/L (ref 39–117)
BUN/Creatinine Ratio: 18 (ref 12–28)
BUN: 14 mg/dL (ref 8–27)
Bilirubin Total: 0.4 mg/dL (ref 0.0–1.2)
CALCIUM: 9.5 mg/dL (ref 8.7–10.3)
CHLORIDE: 98 mmol/L (ref 96–106)
CO2: 27 mmol/L (ref 18–29)
Creatinine, Ser: 0.8 mg/dL (ref 0.57–1.00)
GFR, EST AFRICAN AMERICAN: 90 mL/min/{1.73_m2} (ref >59)
GFR, EST NON AFRICAN AMERICAN: 78 mL/min/{1.73_m2} (ref >59)
GLUCOSE: 96 mg/dL (ref 65–99)
Globulin, Total: 2.5 g/dL (ref 1.5–4.5)
POTASSIUM: 4.3 mmol/L (ref 3.5–5.2)
Sodium: 139 mmol/L (ref 134–144)
TOTAL PROTEIN: 6.8 g/dL (ref 6.0–8.5)

## 2016-10-07 LAB — LIPID PANEL
CHOL/HDL RATIO: 2.1 ratio (ref 0.0–4.4)
Cholesterol, Total: 187 mg/dL (ref 100–199)
HDL: 87 mg/dL (ref >39)
LDL Calculated: 83 mg/dL (ref 0–99)
TRIGLYCERIDES: 83 mg/dL (ref 0–149)
VLDL Cholesterol Cal: 17 mg/dL (ref 5–40)

## 2016-10-07 LAB — IRON AND TIBC
IRON SATURATION: 49 % (ref 15–55)
IRON: 150 ug/dL — AB (ref 27–139)
TIBC: 309 ug/dL (ref 250–450)
UIBC: 159 ug/dL (ref 118–369)

## 2016-10-07 LAB — THYROID PANEL WITH TSH
FREE THYROXINE INDEX: 1.7 (ref 1.2–4.9)
T3 UPTAKE RATIO: 28 % (ref 24–39)
T4 TOTAL: 5.9 ug/dL (ref 4.5–12.0)
TSH: 3.82 u[IU]/mL (ref 0.450–4.500)

## 2016-10-07 LAB — HIV ANTIBODY (ROUTINE TESTING W REFLEX): HIV SCREEN 4TH GENERATION: NONREACTIVE

## 2016-10-07 LAB — VITAMIN D 25 HYDROXY (VIT D DEFICIENCY, FRACTURES): Vit D, 25-Hydroxy: 49.5 ng/mL (ref 30.0–100.0)

## 2016-10-07 NOTE — Telephone Encounter (Signed)
Patient has been advised and copy of labs has been left at the front desk per patients request. Barbara Gates

## 2016-10-07 NOTE — Telephone Encounter (Signed)
-----   Message from Mar Daring, PA-C sent at 10/07/2016  8:26 AM EST ----- All labs are within normal limits and stable.  Thanks! -JB

## 2016-10-17 ENCOUNTER — Encounter: Payer: Self-pay | Admitting: Physician Assistant

## 2016-10-17 ENCOUNTER — Ambulatory Visit (INDEPENDENT_AMBULATORY_CARE_PROVIDER_SITE_OTHER): Payer: Medicare Other | Admitting: Physician Assistant

## 2016-10-17 VITALS — BP 120/80 | HR 62 | Temp 98.3°F | Resp 16 | Wt 145.0 lb

## 2016-10-17 DIAGNOSIS — Z23 Encounter for immunization: Secondary | ICD-10-CM | POA: Diagnosis not present

## 2016-10-17 DIAGNOSIS — M818 Other osteoporosis without current pathological fracture: Secondary | ICD-10-CM | POA: Diagnosis not present

## 2016-10-17 DIAGNOSIS — K5909 Other constipation: Secondary | ICD-10-CM

## 2016-10-17 NOTE — Patient Instructions (Signed)

## 2016-10-17 NOTE — Progress Notes (Signed)
Patient: Barbara Gates Female    DOB: 12/23/1950   65 y.o.   MRN: [id number] Visit Date: 10/17/2016  Today's Provider: Mar Daring, PA-C   Chief Complaint  Patient presents with  . Follow-up  . Constipation   Subjective:    HPI  Follow up for osteoporsis  The patient was last seen for this 2 weeks ago. Changes made at last visit include check labs.  She reports excellent compliance with treatment. She feels that condition is Improved. She is not having side effects. Patient is taking Advil more often. Pt reports she still having right shoulder pain. Has been bothering her for over one year. Notices it most with reaching behind her like to put on her bra. She has been taking ibuprofen for this but does not wish to take any other medications at this time. ------------------------------------------------------------------------------------  Constipation: Patient complains of constipation.  Stool pattern has been 0 formed stool(s) per day. Onset was several years ago Defecation has been incomplete. Co-Morbid conditions:none. Symptoms have been gradually worsening. Current Health Habits: Eating fiber? yes, amt one capful of Miralax 3- 4 days a week Exercise?yes  Water intake? yes, amt unsure Current OTC/RX therapy has been Miralax which has been somewhat effective. She has had colonoscopy before and does have redundant, tortuous colon. She has not noticed any hematochezia or melena. There is no family history of colon cancer.     No Known Allergies   Current Outpatient Prescriptions:  .  CALCIUM-VITAMIN D PO, Take 300 mg by mouth every morning., Disp: , Rfl:  .  Carboxymethylcellul-Glycerin (LUBRICATING EYE DROPS OP), Apply to eye. , Disp: , Rfl:  .  Homeopathic Products (MENTAL CLARITY IN), Take 1 tablet by mouth 2 (two) times daily., Disp: , Rfl:  .  ibuprofen (ADVIL,MOTRIN) 200 MG tablet, Take 200 mg by mouth every 6 (six) hours as needed., Disp: , Rfl:    .  Magnesium 400 MG CAPS, Take 1 tablet by mouth daily., Disp: , Rfl:  .  Multiple Vitamin (MULTIVITAMIN) tablet, Take 1 tablet by mouth daily., Disp: , Rfl:  .  Multiple Vitamins-Minerals (EYE VITAMINS) CAPS, Take by mouth daily., Disp: , Rfl:  .  Polyethylene Glycol 3350 (MIRALAX PO), Take by mouth 4 (four) times a week., Disp: , Rfl:   Review of Systems  Constitutional: Negative.   HENT: Negative.   Respiratory: Negative.   Cardiovascular: Negative.   Gastrointestinal: Positive for abdominal distention and constipation. Negative for abdominal pain, anal bleeding, blood in stool, diarrhea and nausea.  Genitourinary: Negative.   Musculoskeletal: Positive for arthralgias (right shoulder).    Social History  Substance Use Topics  . Smoking status: Never Smoker  . Smokeless tobacco: Never Used  . Alcohol use 1.2 oz/week    1 Glasses of wine, 1 Shots of liquor per week     Comment: margarita   Objective:   BP 120/80 (BP Location: Left Arm, Patient Position: Sitting, Cuff Size: Large)   Pulse 62   Temp 98.3 F (36.8 C) (Oral)   Resp 16   Wt 145 lb (65.8 kg)   SpO2 98%   BMI 21.57 kg/m   Physical Exam  Constitutional: She is oriented to person, place, and time. She appears well-developed and well-nourished. No distress.  Cardiovascular: Normal rate, regular rhythm and normal heart sounds.  Exam reveals no gallop and no friction rub.   No murmur heard. Pulmonary/Chest: Effort normal and breath sounds normal. No respiratory distress.  She has no wheezes. She has no rales.  Abdominal: Soft. Normal appearance and bowel sounds are normal. She exhibits no distension and no mass. There is no hepatosplenomegaly. There is no tenderness. There is no rebound, no guarding and no CVA tenderness.  Neurological: She is alert and oriented to person, place, and time.  Skin: Skin is warm and dry. She is not diaphoretic.       Assessment & Plan:     1. Other constipation Continued  constipation that was noted on x-ray. Patient wanted to discuss treatment options since this was after one of her "stooling periods". I will have her increase her MiraLAX to 2 capsules daily until she is having a loose stool at least once a week. At that time she may back down to one capful daily and keep her stools loose and daily. She is to call the office if she does not get any improvement with MiraLAX.  2. Adult idiopathic generalized osteoporosis We discussed different options for osteoporosis. She does not wish to use bisphosphonates nor is she interested in injectable treatment. She is not using calcium augmentation due to the constipation side effect.  3. Need for pneumococcal vaccination Prevnar-13 vaccine given to patient without complications. Patient sat for 15 minutes after administration and was tolerated well without adverse effects. - Pneumococcal conjugate vaccine 13-valent  4. Need for influenza vaccination Flu vaccine given today without complication. Patient sat upright for 15 minutes to check for adverse reaction before being released. - Flu vaccine HIGH DOSE PF (Fluzone High dose)      Mar Daring, PA-C  [facility]

## 2016-12-13 ENCOUNTER — Other Ambulatory Visit: Payer: Self-pay | Admitting: Physician Assistant

## 2016-12-13 DIAGNOSIS — Z1231 Encounter for screening mammogram for malignant neoplasm of breast: Secondary | ICD-10-CM

## 2017-01-03 DIAGNOSIS — I781 Nevus, non-neoplastic: Secondary | ICD-10-CM | POA: Diagnosis not present

## 2017-01-03 DIAGNOSIS — Z1283 Encounter for screening for malignant neoplasm of skin: Secondary | ICD-10-CM | POA: Diagnosis not present

## 2017-01-03 DIAGNOSIS — L814 Other melanin hyperpigmentation: Secondary | ICD-10-CM | POA: Diagnosis not present

## 2017-01-03 DIAGNOSIS — D229 Melanocytic nevi, unspecified: Secondary | ICD-10-CM | POA: Diagnosis not present

## 2017-01-03 DIAGNOSIS — L821 Other seborrheic keratosis: Secondary | ICD-10-CM | POA: Diagnosis not present

## 2017-01-06 ENCOUNTER — Ambulatory Visit
Admission: RE | Admit: 2017-01-06 | Discharge: 2017-01-06 | Disposition: A | Discharge status: Home / Self Care | Payer: Medicare Other | Source: Ambulatory Visit | Attending: Physician Assistant | Admitting: Physician Assistant

## 2017-01-06 DIAGNOSIS — Z1231 Encounter for screening mammogram for malignant neoplasm of breast: Secondary | ICD-10-CM | POA: Insufficient documentation

## 2017-01-16 ENCOUNTER — Encounter: Payer: Self-pay | Admitting: Physician Assistant

## 2017-01-16 ENCOUNTER — Ambulatory Visit: Payer: Medicare Other | Admitting: Physician Assistant

## 2017-01-16 ENCOUNTER — Ambulatory Visit (INDEPENDENT_AMBULATORY_CARE_PROVIDER_SITE_OTHER): Payer: Medicare Other | Admitting: Physician Assistant

## 2017-01-16 VITALS — BP 128/80 | HR 60 | Temp 97.8°F | Resp 16 | Wt 147.6 lb

## 2017-01-16 DIAGNOSIS — Q438 Other specified congenital malformations of intestine: Secondary | ICD-10-CM | POA: Diagnosis not present

## 2017-01-16 DIAGNOSIS — K5909 Other constipation: Secondary | ICD-10-CM | POA: Diagnosis not present

## 2017-01-16 NOTE — Addendum Note (Signed)
Addended by: Mar Daring on: 01/16/2017 04:20 PM   Modules accepted: Level of Service

## 2017-01-16 NOTE — Patient Instructions (Signed)

## 2017-01-16 NOTE — Addendum Note (Signed)
Addended by: Mar Daring on: 01/16/2017 04:18 PM   Modules accepted: Level of Service

## 2017-01-16 NOTE — Progress Notes (Signed)
Patient: Barbara Gates Female    DOB: 1951/02/06   66 y.o.   MRN: [id number] Visit Date: 01/16/2017  Today's Provider: Mar Daring, PA-C   Chief Complaint  Patient presents with  . Follow-up    Constipation   Subjective:    HPI Patient is here to follow-up constipation. She has been seen by GI for this as well in 2015 following her colonoscopy. She has used miralax, more frequently recently, citrucel (not currently using), magnesium supplementation, dulcolax, and a stool softener. She has refused previous recommendations of starting linzess or amitiza. She states "I should be able to control this naturally and don't want to put those medications in my body." She reports she has been having a daily, small, watery bowel movement daily with pressure and bloating remaining. She reports she is only having one bowel movement that feels normal and is relieving per week.     No Known Allergies   Current Outpatient Prescriptions:  .  CALCIUM-VITAMIN D PO, Take 300 mg by mouth every morning., Disp: , Rfl:  .  Carboxymethylcellul-Glycerin (LUBRICATING EYE DROPS OP), Apply to eye. , Disp: , Rfl:  .  Homeopathic Products (MENTAL CLARITY IN), Take 1 tablet by mouth 2 (two) times daily., Disp: , Rfl:  .  ibuprofen (ADVIL,MOTRIN) 200 MG tablet, Take 200 mg by mouth every 6 (six) hours as needed., Disp: , Rfl:  .  Magnesium 400 MG CAPS, Take 1 tablet by mouth daily., Disp: , Rfl:  .  Multiple Vitamin (MULTIVITAMIN) tablet, Take 1 tablet by mouth daily., Disp: , Rfl:  .  Multiple Vitamins-Minerals (EYE VITAMINS) CAPS, Take by mouth daily., Disp: , Rfl:  .  Polyethylene Glycol 3350 (MIRALAX PO), Take by mouth 4 (four) times a week., Disp: , Rfl:   Review of Systems  Constitutional: Negative.   Respiratory: Negative.   Cardiovascular: Negative.   Gastrointestinal: Positive for abdominal distention, constipation and diarrhea (loose, watery, small amount daily). Negative for  abdominal pain, anal bleeding, blood in stool, nausea, rectal pain and vomiting.  Musculoskeletal: Negative for back pain.    Social History  Substance Use Topics  . Smoking status: Never Smoker  . Smokeless tobacco: Never Used  . Alcohol use 1.2 oz/week    1 Glasses of wine, 1 Shots of liquor per week     Comment: margarita   Objective:   BP 128/80 (BP Location: Right Arm, Patient Position: Sitting, Cuff Size: Normal)   Pulse 60   Temp 97.8 F (36.6 C) (Oral)   Resp 16   Wt 147 lb 9.6 oz (67 kg)   BMI 21.96 kg/m   Physical Exam  Constitutional: She is oriented to person, place, and time. She appears well-developed and well-nourished. No distress.  Cardiovascular: Normal rate, regular rhythm and normal heart sounds.  Exam reveals no gallop and no friction rub.   No murmur heard. Pulmonary/Chest: Effort normal and breath sounds normal. No respiratory distress. She has no wheezes. She has no rales.  Abdominal: Soft. Normal appearance and bowel sounds are normal. She exhibits no distension and no mass. There is no hepatosplenomegaly. There is generalized tenderness. There is no rebound, no guarding and no CVA tenderness.  Neurological: She is alert and oriented to person, place, and time.  Skin: Skin is warm and dry. She is not diaphoretic.  Vitals reviewed.     Assessment & Plan:     1. Chronic constipation Patient still continues to refuse linzess and  amitiza. She also refused referral back to GI at this time. She is wanting to continue current treatment with magnesium and miralax. She is going to add in a colon cleanse regimen once per month or so. Advised of making sure to stay well hydrated during cleanse and she voiced an adequate plan to stay hydrated. She is to call if symptoms worsen or if she is interested in a GI referral.   2. Tortuous colon See above medical treatment plan.       Mar Daring, PA-C  [facility]

## 2017-04-07 ENCOUNTER — Encounter: Payer: Self-pay | Admitting: Physician Assistant

## 2017-04-07 ENCOUNTER — Ambulatory Visit (INDEPENDENT_AMBULATORY_CARE_PROVIDER_SITE_OTHER): Payer: Medicare Other | Admitting: Physician Assistant

## 2017-04-07 VITALS — BP 120/84 | HR 60 | Temp 98.1°F | Resp 16 | Wt 144.8 lb

## 2017-04-07 DIAGNOSIS — H6191 Disorder of right external ear, unspecified: Secondary | ICD-10-CM

## 2017-04-07 DIAGNOSIS — L989 Disorder of the skin and subcutaneous tissue, unspecified: Secondary | ICD-10-CM

## 2017-04-07 DIAGNOSIS — H9193 Unspecified hearing loss, bilateral: Secondary | ICD-10-CM

## 2017-04-07 NOTE — Progress Notes (Signed)
Patient: Barbara Gates Female    DOB: Sep 17, 1951   66 y.o.   MRN: [id number] Visit Date: 04/07/2017  Today's Provider: Mar Daring, PA-C   Chief Complaint  Patient presents with  . Ear Problem   Subjective:    HPI Patient here today C/O right ear pain and itching x's several weeks. Patient denies fever,cough, nose nose or any discharge from ear. She reports that there is a small nodule within the right ear that is sometimes tender to touch and has been itching more recently.     No Known Allergies   Current Outpatient Prescriptions:  .  CALCIUM-VITAMIN D PO, Take 300 mg by mouth every morning., Disp: , Rfl:  .  Carboxymethylcellul-Glycerin (LUBRICATING EYE DROPS OP), Apply to eye. , Disp: , Rfl:  .  ibuprofen (ADVIL,MOTRIN) 200 MG tablet, Take 200 mg by mouth every 6 (six) hours as needed., Disp: , Rfl:  .  Magnesium 400 MG CAPS, Take 1 tablet by mouth daily., Disp: , Rfl:  .  Multiple Vitamin (MULTIVITAMIN) tablet, Take 1 tablet by mouth daily., Disp: , Rfl:  .  Multiple Vitamins-Minerals (EYE VITAMINS) CAPS, Take by mouth daily., Disp: , Rfl:  .  Polyethylene Glycol 3350 (MIRALAX PO), Take by mouth 4 (four) times a week., Disp: , Rfl:  .  Sennosides-Docusate Sodium (SENNA-DOCUSATE SODIUM PO), Take by mouth., Disp: , Rfl:  .  TURMERIC PO, Take by mouth., Disp: , Rfl:   Review of Systems  Constitutional: Negative.   HENT: Positive for ear pain and hearing loss (bilateral decreased hearing). Negative for ear discharge.   Respiratory: Negative.   Cardiovascular: Negative.   Gastrointestinal: Negative.   Neurological: Negative for dizziness and headaches.    Social History  Substance Use Topics  . Smoking status: Never Smoker  . Smokeless tobacco: Never Used  . Alcohol use 1.2 oz/week    1 Glasses of wine, 1 Shots of liquor per week     Comment: margarita   Objective:   BP 120/84 (BP Location: Left Arm, Patient Position: Sitting, Cuff Size:  Normal)   Pulse 60   Temp 98.1 F (36.7 C) (Oral)   Resp 16   Wt 144 lb 12.8 oz (65.7 kg)   SpO2 98%   BMI 21.54 kg/m  Vitals:   04/07/17 1611  BP: 120/84  Pulse: 60  Resp: 16  Temp: 98.1 F (36.7 C)  TempSrc: Oral  SpO2: 98%  Weight: 144 lb 12.8 oz (65.7 kg)     Physical Exam  Constitutional: She appears well-developed and well-nourished. No distress.  HENT:  Head: Normocephalic and atraumatic.  Right Ear: Hearing, tympanic membrane and external ear normal.  Left Ear: Hearing, tympanic membrane, external ear and ear canal normal.  Nose: Nose normal.  Mouth/Throat: Uvula is midline, oropharynx is clear and moist and mucous membranes are normal. No oropharyngeal exudate.  There is a small white papule in the right external canal near the external meatus on the anterior canal wall. It is hard to touch and did not move or rupture when prodded with a ear curette  Neck: Normal range of motion. Neck supple.  Cardiovascular: Normal rate, regular rhythm and normal heart sounds.  Exam reveals no gallop and no friction rub.   No murmur heard. Pulmonary/Chest: Effort normal and breath sounds normal. No respiratory distress. She has no wheezes. She has no rales.  Lymphadenopathy:    She has no cervical adenopathy.  Skin: She is  not diaphoretic.  Vitals reviewed.       Assessment & Plan:     1. Skin lesion of right ear Unsure of what the lesion is and it did not move when manually manipulated with an ear curette. Will refer to ENT for better visualization and treatment.  - Ambulatory referral to ENT  2. Bilateral hearing loss, unspecified hearing loss type Patient also complains of worsening hearing and would also like to have audiometry testing. - Ambulatory referral to ENT       Mar Daring, PA-C  [place]

## 2017-06-21 ENCOUNTER — Ambulatory Visit (INDEPENDENT_AMBULATORY_CARE_PROVIDER_SITE_OTHER): Payer: Medicare Other | Admitting: Physician Assistant

## 2017-06-21 ENCOUNTER — Encounter: Payer: Self-pay | Admitting: Physician Assistant

## 2017-06-21 VITALS — BP 130/72 | HR 60 | Temp 98.4°F | Resp 16 | Wt 140.8 lb

## 2017-06-21 DIAGNOSIS — M810 Age-related osteoporosis without current pathological fracture: Secondary | ICD-10-CM | POA: Diagnosis not present

## 2017-06-21 DIAGNOSIS — Q438 Other specified congenital malformations of intestine: Secondary | ICD-10-CM

## 2017-06-21 DIAGNOSIS — K5909 Other constipation: Secondary | ICD-10-CM | POA: Diagnosis not present

## 2017-06-21 MED ORDER — RALOXIFENE HCL 60 MG PO TABS: 60.0000 mg | ORAL_TABLET | Freq: Every day | ORAL | 1 refills | Status: DC

## 2017-06-21 NOTE — Progress Notes (Signed)
Patient: Barbara Gates Female    DOB: [birth year]   66 y.o.   MRN: [id number] Visit Date: 06/21/2017  Today's Provider: Mar Daring, PA-C   Chief Complaint  Patient presents with  . Constipation   Subjective:    HPI Patient is here today with c/o constipation. Patient with history of chronic constipation. Pt was last seen for this on 01/16/17.She has used Miralax, Citrucel, magnesium supplementation, dulcolax, and stool softener. She refused referral back to GI and Linzess and Amitiza last office visit. She has been drinking a lot of water and taking more laxatives. She reports that her stools are "white, watery" to "bubbly" and just recently had some fecal matter present in BM. She has been drinking apple cider vinegar with honey and water every morning. She is using her Miralax every day now and taking 2 Senna's every night.She used enema last week. She is requesting a GI referral now.  She does report she had been drinking milk as well to help increase her calcium for her osteoporosis but feels this is worsening her constipation as well. She had previously has been on Tamoxifen by her previous PCP for her osteoporosis but developed a chronic cough and this was discontinued.     No Known Allergies   Current Outpatient Prescriptions:  .  Carboxymethylcellul-Glycerin (LUBRICATING EYE DROPS OP), Apply to eye. , Disp: , Rfl:  .  ibuprofen (ADVIL,MOTRIN) 200 MG tablet, Take 200 mg by mouth every 6 (six) hours as needed., Disp: , Rfl:  .  Magnesium 400 MG CAPS, Take 1 tablet by mouth daily., Disp: , Rfl:  .  Multiple Vitamin (MULTIVITAMIN) tablet, Take 1 tablet by mouth daily., Disp: , Rfl:  .  Polyethylene Glycol 3350 (MIRALAX PO), Take by mouth daily. , Disp: , Rfl:  .  Sennosides-Docusate Sodium (SENNA-DOCUSATE SODIUM PO), Take by mouth. , Disp: , Rfl:  .  CALCIUM-VITAMIN D PO, Take 300 mg by mouth every morning., Disp: , Rfl:  .  Multiple Vitamins-Minerals (EYE  VITAMINS) CAPS, Take by mouth daily., Disp: , Rfl:  .  TURMERIC PO, Take by mouth., Disp: , Rfl:   Review of Systems  Constitutional: Negative.   Respiratory: Negative.   Cardiovascular: Negative for chest pain, palpitations and leg swelling.  Gastrointestinal: Positive for abdominal distention, abdominal pain, anal bleeding (from straining a month ago), constipation, diarrhea and rectal pain ("discomfort"). Negative for blood in stool and nausea.  Genitourinary: Negative.   Musculoskeletal: Negative for back pain.  Neurological: Negative.     Social History  Substance Use Topics  . Smoking status: Never Smoker  . Smokeless tobacco: Never Used  . Alcohol use 1.2 oz/week    1 Glasses of wine, 1 Shots of liquor per week     Comment: margarita   Objective:   BP 130/72 (BP Location: Right Arm, Patient Position: Sitting, Cuff Size: Normal)   Pulse 60   Temp 98.4 F (36.9 C) (Oral)   Resp 16   Wt 140 lb 12.8 oz (63.9 kg)   BMI 20.94 kg/m     Physical Exam  Constitutional: She is oriented to person, place, and time. She appears well-developed and well-nourished. No distress.  Cardiovascular: Normal rate, regular rhythm and normal heart sounds.  Exam reveals no gallop and no friction rub.   No murmur heard. Pulmonary/Chest: Effort normal and breath sounds normal. No respiratory distress. She has no wheezes. She has no rales.  Abdominal: Soft. Normal appearance and  bowel sounds are normal. She exhibits no distension and no mass. There is no hepatosplenomegaly. There is no tenderness. There is no rebound, no guarding and no CVA tenderness.  Neurological: She is alert and oriented to person, place, and time.  Skin: Skin is warm and dry. She is not diaphoretic.  Vitals reviewed.     Assessment & Plan:     1. Chronic constipation Patient is unable to afford Amitiza or Linzess per patient. She is going to continue Miralax. Discussed using full dose, not half, and to make sure to take  regular. Will also consider changing from Sennakot to Colace to see if there is any change in symptoms. Referral was placed as below for [facility] for further evaluation and treatment considerations.  - Ambulatory referral to Gastroenterology  2. Tortuous colon Found on colonoscopy in 2005.  - Ambulatory referral to Gastroenterology  3. Age-related osteoporosis without current pathological fracture Had previously tried tamoxifen that caused a cough. Will change to raloxifene to see if she can tolerate and will recheck BMD in 11/2017. - raloxifene (EVISTA) 60 MG tablet; Take 1 tablet (60 mg total) by mouth daily.  Dispense: 90 tablet; Refill: [place], PA-C  [facility]

## 2017-06-21 NOTE — Patient Instructions (Signed)
Fecal Impaction °A fecal impaction is a large, firm amount of stool (feces) that will not pass out of the body. A fecal impaction usually occurs in the end of the large intestine (rectum). It can block the large intestine and cause significant problems. °What are the causes? °This condition may be caused by anything that slows down bowel movements, including: °· Long-term use of medicines that help you have a bowel movement (laxatives). °· Constipation. °· Pain in the rectum. Fecal impaction can occur if you avoid having bowel movements due to the pain. Pain in the rectum can result from a medical condition, such as hemorrhoids or anal fissures. °· Narcotic pain-relieving medicines, such as methadone, morphine, or codeine. °· Not drinking enough fluids. °· Being inactive for a long period of time. °· Diseases of the brain or nervous system that damage nerves that control the muscles of the intestines. ° °What are the signs or symptoms? °Symptoms of this condition include: °· Breathing problems. °· Nausea, vomiting, and dehydration. °· Dizziness. °· Confusion. °· Rapid heartbeat. °· Fever. °· Sweating. °· Changes in blood pressure. °· Not having a normal number of bowel movements. °· Changes in bowel patterns. This may include going to the bathroom less often or not at all. °· A sense of fullness in the rectum but being unable to pass stool. °· Pain or cramps in the abdominal area. These often happen after meals. °· Thin, watery discharge from the rectum. ° °How is this diagnosed? °This condition may be diagnosed based on your symptoms and an exam of your rectum. Sometimes X-rays or lab tests are done to confirm the diagnosis and to check for other problems. °How is this treated? °This condition may be treated by: °· Having your health care provider remove the stool using a gloved finger. °· Taking medicine. °· A suppository or enema given in the rectum to soften the stool, which can stimulate a bowel  movement. ° °Follow these instructions at home: °Eating and drinking °· Drink enough fluid to keep your urine clear or pale yellow. °· Include a lot of fiber in your diet. Foods with a lot of fiber include fruits, vegetables, and oatmeal. °· If you begin to get constipated, increase the amount of fiber in your diet. °General instructions °· Develop bowel habits. An example of a bowel habit is having a bowel movement right after breakfast every day. Be sure to give yourself enough time on the toilet. This may require using enemas, bowel softeners, or suppositories at home, as directed by your health care provider. It may also include using mineral oil or olive oil. °· Exercise regularly. °· Take over-the-counter and prescription medicines only as told by your health care provider. °Contact a health care provider if: °· You have ongoing pain in your rectum. °· You need to use an enema or a suppository more than 2 times a week. °· You have rectal bleeding. °· You continue to have problems. The problems may include not being able to go to the bathroom and long-term (chronic) constipation. °· You have pain in your abdomen. °· You have thin, pencil-like stools. °Get help right away if: °· You have black or tarry stools. °This information is not intended to replace advice given to you by your health care provider. Make sure you discuss any questions you have with your health care provider. °Document Released: 07/30/2004 Document Revised: 06/10/2016 Document Reviewed: 05/12/2016 °Elsevier Interactive Patient Education © 2018 [employer] ° °

## 2017-06-29 ENCOUNTER — Telehealth: Payer: Self-pay | Admitting: Emergency Medicine

## 2017-06-29 NOTE — Telephone Encounter (Signed)
[facility] not available until oct 3rd. She wants to know if you think she needs to go someplace else to be seen sooner. Please advise.    Ok to leave message.

## 2017-06-30 NOTE — Telephone Encounter (Signed)
We can see if anyone else can see her sooner.

## 2017-07-04 NOTE — Telephone Encounter (Signed)
I spoke with pt who states she is feeling better and will keep appointment 08/23/17

## 2017-07-05 DIAGNOSIS — H2513 Age-related nuclear cataract, bilateral: Secondary | ICD-10-CM | POA: Diagnosis not present

## 2017-07-05 DIAGNOSIS — H40013 Open angle with borderline findings, low risk, bilateral: Secondary | ICD-10-CM | POA: Diagnosis not present

## 2017-07-07 DIAGNOSIS — H40013 Open angle with borderline findings, low risk, bilateral: Secondary | ICD-10-CM | POA: Diagnosis not present

## 2017-08-23 DIAGNOSIS — Q438 Other specified congenital malformations of intestine: Secondary | ICD-10-CM | POA: Diagnosis not present

## 2017-08-23 DIAGNOSIS — Z79810 Long term (current) use of selective estrogen receptor modulators (SERMs): Secondary | ICD-10-CM | POA: Diagnosis not present

## 2017-08-23 DIAGNOSIS — K5909 Other constipation: Secondary | ICD-10-CM | POA: Diagnosis not present

## 2017-08-23 DIAGNOSIS — M1990 Unspecified osteoarthritis, unspecified site: Secondary | ICD-10-CM | POA: Diagnosis not present

## 2017-08-23 DIAGNOSIS — Z7982 Long term (current) use of aspirin: Secondary | ICD-10-CM | POA: Diagnosis not present

## 2017-08-23 DIAGNOSIS — Z23 Encounter for immunization: Secondary | ICD-10-CM | POA: Diagnosis not present

## 2017-08-23 DIAGNOSIS — K6389 Other specified diseases of intestine: Secondary | ICD-10-CM | POA: Diagnosis not present

## 2017-08-23 DIAGNOSIS — Z79899 Other long term (current) drug therapy: Secondary | ICD-10-CM | POA: Diagnosis not present

## 2017-09-13 ENCOUNTER — Telehealth: Payer: Self-pay | Admitting: Physician Assistant

## 2017-09-13 NOTE — Telephone Encounter (Signed)
Pt states she is going out of the country to [place] and is asking if she will need any immunizations.  Pt is also asking about having pneumonia shot and shingles shot.    SW#[phone]/[id number]

## 2017-09-13 NOTE — Telephone Encounter (Signed)
Please advise regarding immunizations for going out of the country. Should patient go to the local Health Department? Patient had Prevnar 10/17/16 and Zoster 02/05/14.

## 2017-09-13 NOTE — Telephone Encounter (Signed)
Barbara Gates is on phone discussing husband thus I will forward message to her.

## 2017-09-19 DIAGNOSIS — K5900 Constipation, unspecified: Secondary | ICD-10-CM | POA: Diagnosis not present

## 2017-09-20 DIAGNOSIS — Z23 Encounter for immunization: Secondary | ICD-10-CM | POA: Diagnosis not present

## 2017-10-05 ENCOUNTER — Telehealth: Payer: Self-pay | Admitting: Physician Assistant

## 2017-10-18 ENCOUNTER — Ambulatory Visit (INDEPENDENT_AMBULATORY_CARE_PROVIDER_SITE_OTHER): Payer: Medicare Other | Admitting: Physician Assistant

## 2017-10-18 ENCOUNTER — Encounter: Payer: Self-pay | Admitting: Physician Assistant

## 2017-10-18 ENCOUNTER — Ambulatory Visit (INDEPENDENT_AMBULATORY_CARE_PROVIDER_SITE_OTHER): Payer: Medicare Other

## 2017-10-18 ENCOUNTER — Other Ambulatory Visit: Payer: Self-pay

## 2017-10-18 VITALS — BP 132/72 | HR 60 | Temp 97.5°F | Resp 16 | Ht 69.0 in | Wt 143.0 lb

## 2017-10-18 VITALS — BP 132/72 | HR 60 | Temp 97.5°F | Ht 69.0 in | Wt 143.0 lb

## 2017-10-18 DIAGNOSIS — K5909 Other constipation: Secondary | ICD-10-CM | POA: Diagnosis not present

## 2017-10-18 DIAGNOSIS — Z1159 Encounter for screening for other viral diseases: Secondary | ICD-10-CM | POA: Diagnosis not present

## 2017-10-18 DIAGNOSIS — Z87898 Personal history of other specified conditions: Secondary | ICD-10-CM

## 2017-10-18 DIAGNOSIS — Z0000 Encounter for general adult medical examination without abnormal findings: Secondary | ICD-10-CM

## 2017-10-18 DIAGNOSIS — Z23 Encounter for immunization: Secondary | ICD-10-CM

## 2017-10-18 DIAGNOSIS — E7800 Pure hypercholesterolemia, unspecified: Secondary | ICD-10-CM

## 2017-10-18 DIAGNOSIS — R5383 Other fatigue: Secondary | ICD-10-CM

## 2017-10-18 DIAGNOSIS — Z803 Family history of malignant neoplasm of breast: Secondary | ICD-10-CM | POA: Diagnosis not present

## 2017-10-18 DIAGNOSIS — M818 Other osteoporosis without current pathological fracture: Secondary | ICD-10-CM

## 2017-10-18 DIAGNOSIS — R799 Abnormal finding of blood chemistry, unspecified: Secondary | ICD-10-CM

## 2017-10-18 DIAGNOSIS — Z1239 Encounter for other screening for malignant neoplasm of breast: Secondary | ICD-10-CM

## 2017-10-18 DIAGNOSIS — Z1231 Encounter for screening mammogram for malignant neoplasm of breast: Secondary | ICD-10-CM | POA: Diagnosis not present

## 2017-10-18 NOTE — Patient Instructions (Signed)
Barbara Gates , Thank you for taking time to come for your Medicare Wellness Visit. I appreciate your ongoing commitment to your health goals. Please review the following plan we discussed and let me know if I can assist you in the future.   Screening recommendations/referrals: Colonoscopy: Up to date Mammogram: Up to date Bone Density: Up to date Recommended yearly ophthalmology/optometry visit for glaucoma screening and checkup Recommended yearly dental visit for hygiene and checkup  Vaccinations: Influenza vaccine: completed Pneumococcal vaccine: completed Tdap vaccine: Up to date Shingles vaccine: completed in 2015  Advanced directives: Please bring a copy of your POA ([name]) and/or Living Will to your next appointment.   Conditions/risks identified:  Continue drinking 6-8 glasses of water a day.  Next appointment: 3:00 PM today   Preventive Care 65 Years and Older, Female Preventive care refers to lifestyle choices and visits with your health care provider that can promote health and wellness. What does preventive care include?  A yearly physical exam. This is also called an annual well check.  Dental exams once or twice a year.  Routine eye exams. Ask your health care provider how often you should have your eyes checked.  Personal lifestyle choices, including:  Daily care of your teeth and gums.  Regular physical activity.  Eating a healthy diet.  Avoiding tobacco and drug use.  Limiting alcohol use.  Practicing safe sex.  Taking low-dose aspirin every day.  Taking vitamin and mineral supplements as recommended by your health care provider. What happens during an annual well check? The services and screenings done by your health care provider during your annual well check will depend on your age, overall health, lifestyle risk factors, and family history of disease. Counseling  Your health care provider may ask you questions about your:  Alcohol  use.  Tobacco use.  Drug use.  Emotional well-being.  Home and relationship well-being.  Sexual activity.  Eating habits.  History of falls.  Memory and ability to understand (cognition).  Work and work Statistician.  Reproductive health. Screening  You may have the following tests or measurements:  Height, weight, and BMI.  Blood pressure.  Lipid and cholesterol levels. These may be checked every 5 years, or more frequently if you are over 14 years old.  Skin check.  Lung cancer screening. You may have this screening every year starting at age 76 if you have a 30-pack-year history of smoking and currently smoke or have quit within the past 15 years.  Fecal occult blood test (FOBT) of the stool. You may have this test every year starting at age 3.  Flexible sigmoidoscopy or colonoscopy. You may have a sigmoidoscopy every 5 years or a colonoscopy every 10 years starting at age 70.  Hepatitis C blood test.  Hepatitis B blood test.  Sexually transmitted disease (STD) testing.  Diabetes screening. This is done by checking your blood sugar (glucose) after you have not eaten for a while (fasting). You may have this done every 1-3 years.  Bone density scan. This is done to screen for osteoporosis. You may have this done starting at age 70.  Mammogram. This may be done every 1-2 years. Talk to your health care provider about how often you should have regular mammograms. Talk with your health care provider about your test results, treatment options, and if necessary, the need for more tests. Vaccines  Your health care provider may recommend certain vaccines, such as:  Influenza vaccine. This is recommended every year.  Tetanus, diphtheria, and acellular pertussis (Tdap, Td) vaccine. You may need a Td booster every 10 years.  Zoster vaccine. You may need this after age 11.  Pneumococcal 13-valent conjugate (PCV13) vaccine. One dose is recommended after age  6.  Pneumococcal polysaccharide (PPSV23) vaccine. One dose is recommended after age 69. Talk to your health care provider about which screenings and vaccines you need and how often you need them. This information is not intended to replace advice given to you by your health care provider. Make sure you discuss any questions you have with your health care provider. Document Released: 12/04/2015 Document Revised: 07/27/2016 Document Reviewed: 09/08/2015 Elsevier Interactive Patient Education  2017 [place] Prevention in the Home Falls can cause injuries. They can happen to people of all ages. There are many things you can do to make your home safe and to help prevent falls. What can I do on the outside of my home?  Regularly fix the edges of walkways and driveways and fix any cracks.  Remove anything that might make you trip as you walk through a door, such as a raised step or threshold.  Trim any bushes or trees on the path to your home.  Use bright outdoor lighting.  Clear any walking paths of anything that might make someone trip, such as rocks or tools.  Regularly check to see if handrails are loose or broken. Make sure that both sides of any steps have handrails.  Any raised decks and porches should have guardrails on the edges.  Have any leaves, snow, or ice cleared regularly.  Use sand or salt on walking paths during winter.  Clean up any spills in your garage right away. This includes oil or grease spills. What can I do in the bathroom?  Use night lights.  Install grab bars by the toilet and in the tub and shower. Do not use towel bars as grab bars.  Use non-skid mats or decals in the tub or shower.  If you need to sit down in the shower, use a plastic, non-slip stool.  Keep the floor dry. Clean up any water that spills on the floor as soon as it happens.  Remove soap buildup in the tub or shower regularly.  Attach bath mats securely with double-sided  non-slip rug tape.  Do not have throw rugs and other things on the floor that can make you trip. What can I do in the bedroom?  Use night lights.  Make sure that you have a light by your bed that is easy to reach.  Do not use any sheets or blankets that are too big for your bed. They should not hang down onto the floor.  Have a firm chair that has side arms. You can use this for support while you get dressed.  Do not have throw rugs and other things on the floor that can make you trip. What can I do in the kitchen?  Clean up any spills right away.  Avoid walking on wet floors.  Keep items that you use a lot in easy-to-reach places.  If you need to reach something above you, use a strong step stool that has a grab bar.  Keep electrical cords out of the way.  Do not use floor polish or wax that makes floors slippery. If you must use wax, use non-skid floor wax.  Do not have throw rugs and other things on the floor that can make you trip. What can I do  with my stairs?  Do not leave any items on the stairs.  Make sure that there are handrails on both sides of the stairs and use them. Fix handrails that are broken or loose. Make sure that handrails are as long as the stairways.  Check any carpeting to make sure that it is firmly attached to the stairs. Fix any carpet that is loose or worn.  Avoid having throw rugs at the top or bottom of the stairs. If you do have throw rugs, attach them to the floor with carpet tape.  Make sure that you have a light switch at the top of the stairs and the bottom of the stairs. If you do not have them, ask someone to add them for you. What else can I do to help prevent falls?  Wear shoes that:  Do not have high heels.  Have rubber bottoms.  Are comfortable and fit you well.  Are closed at the toe. Do not wear sandals.  If you use a stepladder:  Make sure that it is fully opened. Do not climb a closed stepladder.  Make sure that both  sides of the stepladder are locked into place.  Ask someone to hold it for you, if possible.  Clearly mark and make sure that you can see:  Any grab bars or handrails.  First and last steps.  Where the edge of each step is.  Use tools that help you move around (mobility aids) if they are needed. These include:  Canes.  Walkers.  Scooters.  Crutches.  Turn on the lights when you go into a dark area. Replace any light bulbs as soon as they burn out.  Set up your furniture so you have a clear path. Avoid moving your furniture around.  If any of your floors are uneven, fix them.  If there are any pets around you, be aware of where they are.  Review your medicines with your doctor. Some medicines can make you feel dizzy. This can increase your chance of falling. Ask your doctor what other things that you can do to help prevent falls. This information is not intended to replace advice given to you by your health care provider. Make sure you discuss any questions you have with your health care provider. Document Released: 09/03/2009 Document Revised: 04/14/2016 Document Reviewed: 12/12/2014 Elsevier Interactive Patient Education  2017 [employer].

## 2017-10-18 NOTE — Progress Notes (Signed)
Subjective:   Barbara Gates is a 66 y.o. female who presents for Medicare Annual (Subsequent) preventive examination.  Review of Systems:  N/A  Cardiac Risk Factors include: advanced age (>64men, >43 women);dyslipidemia     Objective:     Vitals: BP 132/72 (BP Location: Left Arm)   Pulse 60   Temp (!) 97.5 F (36.4 C) (Oral)   Ht 5\' 9"  (1.753 m)   Wt 143 lb (64.9 kg)   BMI 21.12 kg/m   Body mass index is 21.12 kg/m.   Tobacco Social History   Tobacco Use  Smoking Status Never Smoker  Smokeless Tobacco Never Used     Counseling given: Not Answered   Past Medical History:  Diagnosis Date  . Hypercalcemia   . Hyperlipidemia    Has history of this  . Osteoporosis    Past Surgical History:  Procedure Laterality Date  . ABDOMINAL HYSTERECTOMY  1994   Total including ovarie. Due to endometriosis  . BREAST BIOPSY Left 11/08/2012   Dr. Bary Castilla, neg  . Surgical Lipoma Removal; 12/13/2012  12/13/2012   Family History  Problem Relation Age of Onset  . Cancer Mother        lung, pancreas, melanoma, carcinoid tumors  . Hypertension Mother   . Hyperlipidemia Mother   . Diabetes Mother   . Hypertension Father   . Diabetes Father   . Hyperlipidemia Father   . Parkinson's disease Father   . CAD Father   . Diabetes Brother   . Hypertension Brother   . Breast cancer Sister 80  . Arthritis Sister   . Hypertension Sister    Social History   Substance and Sexual Activity  Sexual Activity Not on file    Outpatient Encounter Medications as of 10/18/2017  Medication Sig  . aspirin 81 MG chewable tablet Chew 81 mg by mouth. 4 times a week  . Calcium-Phosphorus-Vitamin D (CALCIUM GUMMIES PO) Take 250 mg by mouth 2 (two) times daily.  . Carboxymethylcellul-Glycerin (LUBRICATING EYE DROPS OP) Apply to eye.   . cyanocobalamin 100 MCG tablet Take by mouth daily.  Marland Kitchen ibuprofen (ADVIL,MOTRIN) 200 MG tablet Take 200 mg by mouth every 6 (six) hours as needed.     . Magnesium 400 MG CAPS Take 250 mg by mouth 2 (two) times daily.   . Multiple Minerals-Vitamins (CALCIUM CITRATE-MAG-MINERALS PO) Take 250 mg by mouth daily.  . Multiple Vitamin (MULTIVITAMIN) tablet Take 1 tablet by mouth daily.  . Polyethylene Glycol 3350 (MIRALAX PO) Take by mouth daily.   . raloxifene (EVISTA) 60 MG tablet Take 1 tablet (60 mg total) by mouth daily.  Orlie Dakin Sodium (SENNA-DOCUSATE SODIUM PO) Take by mouth.   . Multiple Vitamins-Minerals (EYE VITAMINS) CAPS Take by mouth daily.  . [DISCONTINUED] CALCIUM-VITAMIN D PO Take 300 mg by mouth every morning.  . [DISCONTINUED] TURMERIC PO Take by mouth.   No facility-administered encounter medications on file as of 10/18/2017.     Activities of Daily Living In your present state of health, do you have any difficulty performing the following activities: 10/18/2017  Hearing? Y  Comment some trouble hearing- pt has seen an audiologist  Vision? N  Difficulty concentrating or making decisions? N  Walking or climbing stairs? N  Dressing or bathing? N  Doing errands, shopping? N  Preparing Food and eating ? N  Using the Toilet? N  In the past six months, have you accidently leaked urine? N  Comment urgency  Do you have problems  with loss of bowel control? N  Managing your Medications? N  Managing your Finances? N  Housekeeping or managing your Housekeeping? N  Some recent data might be hidden    Patient Care Team: Mar Daring, PA-C as PCP - General (Family Medicine) Brendolyn Patty, MD as Consulting Physician (Dermatology) Norm Salt, [place] as Consulting Physician (Gastroenterology)    Assessment:     Exercise Activities and Dietary recommendations Current Exercise Habits: Home exercise routine, Type of exercise: walking, Time (Minutes): 20, Frequency (Times/Week): 2, Weekly Exercise (Minutes/Week): 40, Intensity: Mild, Exercise limited by: None identified  Goals    . DIET - INCREASE WATER  INTAKE     Continue drinking 6-8 glasses of water a day.       Fall Risk Fall Risk  10/18/2017 10/05/2016  Falls in the past year? No Yes  Number falls in past yr: - 2 or more  Injury with Fall? - No   Depression Screen PHQ 2/9 Scores 10/18/2017 10/05/2016  PHQ - 2 Score 0 0     Cognitive Function     6CIT Screen 10/18/2017 10/05/2016  What Year? 0 points 0 points  What month? 0 points 0 points  What time? 0 points 0 points  Count back from 20 0 points 0 points  Months in reverse 0 points 0 points  Repeat phrase 0 points 0 points  Total Score 0 0    Immunization History  Administered Date(s) Administered  . Hepatitis A 09/20/2017  . Hepatitis A, Adult 09/20/2017  . Influenza Split 09/06/2011, 09/11/2012  . Influenza, High Dose Seasonal PF 10/17/2016  . Influenza,inj,Quad PF,6+ Mos 09/13/2013, 12/07/2015, 08/23/2017  . Pneumococcal Conjugate-13 10/17/2016  . Pneumococcal Polysaccharide-23 10/18/2017  . Tdap 09/02/2010  . Typhoid Inactivated 09/20/2017  . Zoster 02/05/2014   Screening Tests Health Maintenance  Topic Date Due  . Hepatitis C Screening  10/05/2026 (Originally 14-Jun-1951)  . MAMMOGRAM  01/06/2019  . TETANUS/TDAP  09/02/2020  . COLONOSCOPY  03/21/2024  . INFLUENZA VACCINE  Completed  . DEXA SCAN  Completed  . PNA vac Low Risk Adult  Completed      Plan:  I have personally reviewed and addressed the Medicare Annual Wellness questionnaire and have noted the following in the patient's chart:  A. Medical and social history B. Use of alcohol, tobacco or illicit drugs  C. Current medications and supplements D. Functional ability and status E.  Nutritional status F.  Physical activity G. Advance directives H. List of other physicians I.  Hospitalizations, surgeries, and ER visits in previous 12 months J.  [place] such as hearing and vision if needed, cognitive and depression L. Referrals and appointments - none  In addition, I have  reviewed and discussed with patient certain preventive protocols, quality metrics, and best practice recommendations. A written personalized care plan for preventive services as well as general preventive health recommendations were provided to patient.  See attached scanned questionnaire for additional information.   Signed,  Fabio Neighbors, LPN Nurse Health Advisor   MD Recommendations: None.

## 2017-10-18 NOTE — Progress Notes (Signed)
Patient: Barbara Gates, Female    DOB: 07-04-1951, 66 y.o.   MRN: [id number] Visit Date: 10/18/2017  Today's Provider: Mar Daring, PA-C   Chief Complaint  Patient presents with  . Annual Exam   Subjective:    Annual wellness visit Barbara Gates is a 66 y.o. female. She feels well. She reports exercising walking and active with daily activites. She reports she is sleeping well.  10/05/16 CPE 01/09/17 Mammogram 03/21/14 Colonoscopy  She has chronic constipation and is now being followed by [facility].  -----------------------------------------------------------   Review of Systems  Constitutional: Positive for chills.  HENT: Negative.   Eyes: Negative.        Left eye twitching  Respiratory: Negative.   Cardiovascular: Negative.   Gastrointestinal: Positive for constipation.  Endocrine: Positive for cold intolerance.  Genitourinary: Positive for frequency and urgency.  Musculoskeletal: Positive for arthralgias.  Skin: Negative.   Allergic/Immunologic: Positive for environmental allergies (poison ivy).  Neurological: Negative.   Hematological: Negative.   Psychiatric/Behavioral: Negative.     Social History   Socioeconomic History  . Marital status: Married    Spouse name: Not on file  . Number of children: Not on file  . Years of education: Not on file  . Highest education level: Not on file  Social Needs  . Financial resource strain: Not on file  . Food insecurity - worry: Not on file  . Food insecurity - inability: Not on file  . Transportation needs - medical: Not on file  . Transportation needs - non-medical: Not on file  Occupational History  . Not on file  Tobacco Use  . Smoking status: Never Smoker  . Smokeless tobacco: Never Used  Substance and Sexual Activity  . Alcohol use: Yes    Alcohol/week: 0.6 oz    Types: 1 Glasses of wine per week    Comment: margarita  . Drug use: No  . Sexual activity: Not on file    Other Topics Concern  . Not on file  Social History Narrative  . Not on file    Past Medical History:  Diagnosis Date  . Hypercalcemia   . Hyperlipidemia    Has history of this  . Osteoporosis      Patient Active Problem List   Diagnosis Date Noted  . Chronic cough 06/01/2016  . DOE (dyspnea on exertion) 06/01/2016  . Hypercalcemia 12/30/2015  . Memory impairment of gradual onset 12/30/2015  . Abnormal blood chemistry 06/11/2015  . Adult idiopathic generalized osteoporosis 06/11/2015  . Colon polyp 06/11/2015  . Closed fracture of proximal end of radius 06/11/2015  . Auditory impairment 06/11/2015  . Blood in the urine 06/11/2015  . Calcium blood increased 06/11/2015  . Hypercholesteremia 06/11/2015  . Gonalgia 06/11/2015  . Herpes zona 06/11/2015  . Female genuine stress incontinence 06/11/2015  . Chronic constipation 09/02/2014  . Lipoma of back-left 07/18/2012    Past Surgical History:  Procedure Laterality Date  . ABDOMINAL HYSTERECTOMY  1994   Total including ovarie. Due to endometriosis  . BREAST BIOPSY Left 11/08/2012   Dr. Bary Castilla, neg  . Surgical Lipoma Removal; 12/13/2012  12/13/2012    Her family history includes Arthritis in her sister; Breast cancer (age of onset: 74) in her sister; CAD in her father; Cancer in her mother; Diabetes in her brother, father, and mother; Hyperlipidemia in her father and mother; Hypertension in her brother, father, mother, and sister; Parkinson's disease in her father.  Current Outpatient Medications:  .  aspirin 81 MG chewable tablet, Chew 81 mg by mouth. 4 times a week, Disp: , Rfl:  .  Calcium-Phosphorus-Vitamin D (CALCIUM GUMMIES PO), Take 250 mg by mouth 2 (two) times daily., Disp: , Rfl:  .  Carboxymethylcellul-Glycerin (LUBRICATING EYE DROPS OP), Apply to eye. , Disp: , Rfl:  .  cyanocobalamin 100 MCG tablet, Take by mouth daily., Disp: , Rfl:  .  ibuprofen (ADVIL,MOTRIN) 200 MG tablet, Take 200 mg by mouth  every 6 (six) hours as needed. , Disp: , Rfl:  .  Magnesium 400 MG CAPS, Take 250 mg by mouth 2 (two) times daily. , Disp: , Rfl:  .  Multiple Minerals-Vitamins (CALCIUM CITRATE-MAG-MINERALS PO), Take 250 mg by mouth daily., Disp: , Rfl:  .  Multiple Vitamin (MULTIVITAMIN) tablet, Take 1 tablet by mouth daily., Disp: , Rfl:  .  Multiple Vitamins-Minerals (EYE VITAMINS) CAPS, Take by mouth daily., Disp: , Rfl:  .  Polyethylene Glycol 3350 (MIRALAX PO), Take by mouth daily. , Disp: , Rfl:  .  raloxifene (EVISTA) 60 MG tablet, Take 1 tablet (60 mg total) by mouth daily., Disp: 90 tablet, Rfl: 1 .  Sennosides-Docusate Sodium (SENNA-DOCUSATE SODIUM PO), Take by mouth. , Disp: , Rfl:   Patient Care Team: Mar Daring, PA-C as PCP - General (Family Medicine) Brendolyn Patty, MD as Consulting Physician (Dermatology) Norm Salt, [place] as Consulting Physician (Gastroenterology)     Objective:   Vitals: BP 132/72 (BP Location: Left Arm, Patient Position: Sitting, Cuff Size: Normal)   Pulse 60   Temp (!) 97.5 F (36.4 C) (Oral)   Resp 16   Ht 5\' 9"  (1.753 m)   Wt 143 lb (64.9 kg)   BMI 21.12 kg/m   Physical Exam  Constitutional: She is oriented to person, place, and time. She appears well-developed and well-nourished. No distress.  HENT:  Head: Normocephalic and atraumatic.  Right Ear: Hearing, tympanic membrane, external ear and ear canal normal.  Left Ear: Hearing, tympanic membrane, external ear and ear canal normal.  Nose: Nose normal.  Mouth/Throat: Uvula is midline, oropharynx is clear and moist and mucous membranes are normal. No oropharyngeal exudate.  Eyes: Conjunctivae and EOM are normal. Pupils are equal, round, and reactive to light. Right eye exhibits no discharge. Left eye exhibits no discharge. No scleral icterus.  Neck: Normal range of motion. Neck supple. No JVD present. Carotid bruit is not present. No tracheal deviation present. No thyromegaly present.    Cardiovascular: Normal rate, regular rhythm, normal heart sounds and intact distal pulses. Exam reveals no gallop and no friction rub.  No murmur heard. Pulmonary/Chest: Effort normal and breath sounds normal. No respiratory distress. She has no wheezes. She has no rales. She exhibits no tenderness.  Abdominal: Soft. Bowel sounds are normal. She exhibits no distension and no mass. There is no tenderness. There is no rebound and no guarding.  Musculoskeletal: Normal range of motion. She exhibits no edema or tenderness.  Lymphadenopathy:    She has no cervical adenopathy.  Neurological: She is alert and oriented to person, place, and time.  Skin: Skin is warm and dry. No rash noted. She is not diaphoretic.  Psychiatric: She has a normal mood and affect. Her behavior is normal. Judgment and thought content normal.  Vitals reviewed.   Activities of Daily Living In your present state of health, do you have any difficulty performing the following activities: 10/18/2017  Hearing? Y  Comment some trouble hearing-  pt has seen an audiologist  Vision? N  Difficulty concentrating or making decisions? N  Walking or climbing stairs? N  Dressing or bathing? N  Doing errands, shopping? N  Preparing Food and eating ? N  Using the Toilet? N  In the past six months, have you accidently leaked urine? N  Comment urgency  Do you have problems with loss of bowel control? N  Managing your Medications? N  Managing your Finances? N  Housekeeping or managing your Housekeeping? N  Some recent data might be hidden    Fall Risk Assessment Fall Risk  10/18/2017 10/05/2016  Falls in the past year? No Yes  Number falls in past yr: - 2 or more  Injury with Fall? - No     Depression Screen PHQ 2/9 Scores 10/18/2017 10/05/2016  PHQ - 2 Score 0 0    Cognitive Testing - 6-CIT  Correct? Score   What year is it? yes 0 0 or 4  What month is it? yes 0 0 or 3  Memorize:    [name],  42,  [address],   [place],      What time is it? (within 1 hour) yes 0 0 or 3  Count backwards from 20 yes 0 0, 2, or 4  Name the months of the year yes 0 0, 2, or 4  Repeat name & address above yes 0 0, 2, 4, 6, 8, or 10       TOTAL SCORE  0/28   Interpretation:  Normal  Normal (0-7) Abnormal (8-28)       Assessment & Plan:     Annual Wellness Visit  Reviewed patient's Family Medical History Reviewed and updated list of patient's medical providers Assessment of cognitive impairment was done Assessed patient's functional ability Established a written schedule for health screening [place] Completed and Reviewed  Exercise Activities and Dietary recommendations Goals    . DIET - INCREASE WATER INTAKE     Continue drinking 6-8 glasses of water a day.        Immunization History  Administered Date(s) Administered  . Hepatitis A 09/20/2017  . Hepatitis A, Adult 09/20/2017  . Influenza Split 09/06/2011, 09/11/2012  . Influenza, High Dose Seasonal PF 10/17/2016  . Influenza,inj,Quad PF,6+ Mos 09/13/2013, 12/07/2015, 08/23/2017  . Pneumococcal Conjugate-13 10/17/2016  . Pneumococcal Polysaccharide-23 10/18/2017  . Tdap 09/02/2010  . Typhoid Inactivated 09/20/2017  . Zoster 02/05/2014    Health Maintenance  Topic Date Due  . Hepatitis C Screening  10/05/2026 (Originally 1951-10-26)  . MAMMOGRAM  01/06/2019  . TETANUS/TDAP  09/02/2020  . COLONOSCOPY  03/21/2024  . INFLUENZA VACCINE  Completed  . DEXA SCAN  Completed  . PNA vac Low Risk Adult  Completed     Discussed health benefits of physical activity, and encouraged her to engage in regular exercise appropriate for her age and condition.    1. Breast cancer screening Due for mammogram in 12/2017. Mammogram ordered as below. Patient does perform self breast exams. Patient does have family history of breast cancer. Patient has had personal history of abnormal mammograms in the distant past as well.  - MM  Digital Screening; Future  2. Family history of breast cancer See above medical treatment plan. - MM Digital Screening; Future  3. Chronic constipation Followed by [facility]. Is scheduled to start pelvic floor therapy in January. Follows up with GI in January as well.  - CBC w/Diff/Platelet - COMPLETE METABOLIC PANEL WITH  GFR - Thyroid Panel With TSH  4. Adult idiopathic generalized osteoporosis Patient on Raloxifene for osteoporosis. Bone density will be ordered through [facility].  - CBC w/Diff/Platelet - COMPLETE METABOLIC PANEL WITH GFR - Thyroid Panel With TSH  5. Abnormal blood chemistry H/O this (elevated calcium). Will check labs as below and f/u pending results. If calcium still elevated will check PTH.  - CBC w/Diff/Platelet - COMPLETE METABOLIC PANEL WITH GFR - Thyroid Panel With TSH  6. Hypercholesteremia Diet controlled. Will check labs as below and f/u pending results. - CBC w/Diff/Platelet - COMPLETE METABOLIC PANEL WITH GFR - Thyroid Panel With TSH - Lipid Profile  7. Hypercalcemia See above medical treatment plan for #5.  - CBC w/Diff/Platelet - COMPLETE METABOLIC PANEL WITH GFR - Thyroid Panel With TSH  8. Fatigue, unspecified type Thyroid has been normal in the past but patient has symptoms of thyroid. Will check labs as below and f/u pending results. - Thyroid Panel With TSH  9. Need for hepatitis C screening test - Hepatitis C Antibody  ------------------------------------------------------------------------------------------------------------    Mar Daring, PA-C  [facility]

## 2017-10-18 NOTE — Addendum Note (Signed)
Addended by: Mar Daring on: 10/18/2017 04:45 PM   Modules accepted: Orders

## 2017-10-18 NOTE — Patient Instructions (Signed)
Health Maintenance for Postmenopausal Women Menopause is a normal process in which your reproductive ability comes to an end. This process happens gradually over a span of months to years, usually between the ages of 22 and 9. Menopause is complete when you have missed 12 consecutive menstrual periods. It is important to talk with your health care provider about some of the most common conditions that affect postmenopausal women, such as heart disease, cancer, and bone loss (osteoporosis). Adopting a healthy lifestyle and getting preventive care can help to promote your health and wellness. Those actions can also lower your chances of developing some of these common conditions. What should I know about menopause? During menopause, you may experience a number of symptoms, such as:  Moderate-to-severe hot flashes.  Night sweats.  Decrease in sex drive.  Mood swings.  Headaches.  Tiredness.  Irritability.  Memory problems.  Insomnia.  Choosing to treat or not to treat menopausal changes is an individual decision that you make with your health care provider. What should I know about hormone replacement therapy and supplements? Hormone therapy products are effective for treating symptoms that are associated with menopause, such as hot flashes and night sweats. Hormone replacement carries certain risks, especially as you become older. If you are thinking about using estrogen or estrogen with progestin treatments, discuss the benefits and risks with your health care provider. What should I know about heart disease and stroke? Heart disease, heart attack, and stroke become more likely as you age. This may be due, in part, to the hormonal changes that your body experiences during menopause. These can affect how your body processes dietary fats, triglycerides, and cholesterol. Heart attack and stroke are both medical emergencies. There are many things that you can do to help prevent heart disease  and stroke:  Have your blood pressure checked at least every 1-2 years. High blood pressure causes heart disease and increases the risk of stroke.  If you are 53-22 years old, ask your health care provider if you should take aspirin to prevent a heart attack or a stroke.  Do not use any tobacco products, including cigarettes, chewing tobacco, or electronic cigarettes. If you need help quitting, ask your health care provider.  It is important to eat a healthy diet and maintain a healthy weight. ? Be sure to include plenty of vegetables, fruits, low-fat dairy products, and lean protein. ? Avoid eating foods that are high in solid fats, added sugars, or salt (sodium).  Get regular exercise. This is one of the most important things that you can do for your health. ? Try to exercise for at least 150 minutes each week. The type of exercise that you do should increase your heart rate and make you sweat. This is known as moderate-intensity exercise. ? Try to do strengthening exercises at least twice each week. Do these in addition to the moderate-intensity exercise.  Know your numbers.Ask your health care provider to check your cholesterol and your blood glucose. Continue to have your blood tested as directed by your health care provider.  What should I know about cancer screening? There are several types of cancer. Take the following steps to reduce your risk and to catch any cancer development as early as possible. Breast Cancer  Practice breast self-awareness. ? This means understanding how your breasts normally appear and feel. ? It also means doing regular breast self-exams. Let your health care provider know about any changes, no matter how small.  If you are 40  or older, have a clinician do a breast exam (clinical breast exam or CBE) every year. Depending on your age, family history, and medical history, it may be recommended that you also have a yearly breast X-ray (mammogram).  If you  have a family history of breast cancer, talk with your health care provider about genetic screening.  If you are at high risk for breast cancer, talk with your health care provider about having an MRI and a mammogram every year.  Breast cancer (BRCA) gene test is recommended for women who have family members with BRCA-related cancers. Results of the assessment will determine the need for genetic counseling and BRCA1 and for BRCA2 testing. BRCA-related cancers include these types: ? Breast. This occurs in males or females. ? Ovarian. ? Tubal. This may also be called fallopian tube cancer. ? Cancer of the abdominal or pelvic lining (peritoneal cancer). ? Prostate. ? Pancreatic.  Cervical, Uterine, and Ovarian Cancer Your health care provider may recommend that you be screened regularly for cancer of the pelvic organs. These include your ovaries, uterus, and vagina. This screening involves a pelvic exam, which includes checking for microscopic changes to the surface of your cervix (Pap test).  For women ages 21-65, health care providers may recommend a pelvic exam and a Pap test every three years. For women ages 79-65, they may recommend the Pap test and pelvic exam, combined with testing for human papilloma virus (HPV), every five years. Some types of HPV increase your risk of cervical cancer. Testing for HPV may also be done on women of any age who have unclear Pap test results.  Other health care providers may not recommend any screening for nonpregnant women who are considered low risk for pelvic cancer and have no symptoms. Ask your health care provider if a screening pelvic exam is right for you.  If you have had past treatment for cervical cancer or a condition that could lead to cancer, you need Pap tests and screening for cancer for at least 20 years after your treatment. If Pap tests have been discontinued for you, your risk factors (such as having a new sexual partner) need to be  reassessed to determine if you should start having screenings again. Some women have medical problems that increase the chance of getting cervical cancer. In these cases, your health care provider may recommend that you have screening and Pap tests more often.  If you have a family history of uterine cancer or ovarian cancer, talk with your health care provider about genetic screening.  If you have vaginal bleeding after reaching menopause, tell your health care provider.  There are currently no reliable tests available to screen for ovarian cancer.  Lung Cancer Lung cancer screening is recommended for adults 69-62 years old who are at high risk for lung cancer because of a history of smoking. A yearly low-dose CT scan of the lungs is recommended if you:  Currently smoke.  Have a history of at least 30 pack-years of smoking and you currently smoke or have quit within the past 15 years. A pack-year is smoking an average of one pack of cigarettes per day for one year.  Yearly screening should:  Continue until it has been 15 years since you quit.  Stop if you develop a health problem that would prevent you from having lung cancer treatment.  Colorectal Cancer  This type of cancer can be detected and can often be prevented.  Routine colorectal cancer screening usually begins at  age 42 and continues through age 45.  If you have risk factors for colon cancer, your health care provider may recommend that you be screened at an earlier age.  If you have a family history of colorectal cancer, talk with your health care provider about genetic screening.  Your health care provider may also recommend using home test kits to check for hidden blood in your stool.  A small camera at the end of a tube can be used to examine your colon directly (sigmoidoscopy or colonoscopy). This is done to check for the earliest forms of colorectal cancer.  Direct examination of the colon should be repeated every  5-10 years until age 71. However, if early forms of precancerous polyps or small growths are found or if you have a family history or genetic risk for colorectal cancer, you may need to be screened more often.  Skin Cancer  Check your skin from head to toe regularly.  Monitor any moles. Be sure to tell your health care provider: ? About any new moles or changes in moles, especially if there is a change in a mole's shape or color. ? If you have a mole that is larger than the size of a pencil eraser.  If any of your family members has a history of skin cancer, especially at a young age, talk with your health care provider about genetic screening.  Always use sunscreen. Apply sunscreen liberally and repeatedly throughout the day.  Whenever you are outside, protect yourself by wearing long sleeves, pants, a wide-brimmed hat, and sunglasses.  What should I know about osteoporosis? Osteoporosis is a condition in which bone destruction happens more quickly than new bone creation. After menopause, you may be at an increased risk for osteoporosis. To help prevent osteoporosis or the bone fractures that can happen because of osteoporosis, the following is recommended:  If you are 46-71 years old, get at least 1,000 mg of calcium and at least 600 mg of vitamin D per day.  If you are older than age 55 but younger than age 65, get at least 1,200 mg of calcium and at least 600 mg of vitamin D per day.  If you are older than age 54, get at least 1,200 mg of calcium and at least 800 mg of vitamin D per day.  Smoking and excessive alcohol intake increase the risk of osteoporosis. Eat foods that are rich in calcium and vitamin D, and do weight-bearing exercises several times each week as directed by your health care provider. What should I know about how menopause affects my mental health? Depression may occur at any age, but it is more common as you become older. Common symptoms of depression  include:  Low or sad mood.  Changes in sleep patterns.  Changes in appetite or eating patterns.  Feeling an overall lack of motivation or enjoyment of activities that you previously enjoyed.  Frequent crying spells.  Talk with your health care provider if you think that you are experiencing depression. What should I know about immunizations? It is important that you get and maintain your immunizations. These include:  Tetanus, diphtheria, and pertussis (Tdap) booster vaccine.  Influenza every year before the flu season begins.  Pneumonia vaccine.  Shingles vaccine.  Your health care provider may also recommend other immunizations. This information is not intended to replace advice given to you by your health care provider. Make sure you discuss any questions you have with your health care provider. Document Released: 12/30/2005  Document Revised: 05/27/2016 Document Reviewed: 08/11/2015 Elsevier Interactive Patient Education  2018 [employer] Inc.  

## 2017-10-19 ENCOUNTER — Encounter: Payer: Self-pay | Admitting: Physician Assistant

## 2017-10-19 DIAGNOSIS — K5909 Other constipation: Secondary | ICD-10-CM | POA: Diagnosis not present

## 2017-10-19 DIAGNOSIS — E7800 Pure hypercholesterolemia, unspecified: Secondary | ICD-10-CM | POA: Diagnosis not present

## 2017-10-19 DIAGNOSIS — M818 Other osteoporosis without current pathological fracture: Secondary | ICD-10-CM | POA: Diagnosis not present

## 2017-10-19 DIAGNOSIS — R799 Abnormal finding of blood chemistry, unspecified: Secondary | ICD-10-CM | POA: Diagnosis not present

## 2017-10-19 DIAGNOSIS — R5383 Other fatigue: Secondary | ICD-10-CM | POA: Diagnosis not present

## 2017-10-19 LAB — CBC WITH DIFFERENTIAL/PLATELET
BASOS ABS: 44 {cells}/uL (ref 0–200)
BASOS PCT: 0.6 %
EOS ABS: 110 {cells}/uL (ref 15–500)
Eosinophils Relative: 1.5 %
HCT: 40.2 % (ref 35.0–45.0)
Hemoglobin: 13.6 g/dL (ref 11.7–15.5)
Lymphs Abs: 2307 cells/uL (ref 850–3900)
MCH: 30.8 pg (ref 27.0–33.0)
MCHC: 33.8 g/dL (ref 32.0–36.0)
MCV: 91 fL (ref 80.0–100.0)
MONOS PCT: 7.2 %
MPV: 11 fL (ref 7.5–12.5)
Neutro Abs: 4314 cells/uL (ref 1500–7800)
Neutrophils Relative %: 59.1 %
PLATELETS: 249 10*3/uL (ref 140–400)
RBC: 4.42 10*6/uL (ref 3.80–5.10)
RDW: 11.8 % (ref 11.0–15.0)
TOTAL LYMPHOCYTE: 31.6 %
WBC mixed population: 526 cells/uL (ref 200–950)
WBC: 7.3 10*3/uL (ref 3.8–10.8)

## 2017-10-19 LAB — COMPLETE METABOLIC PANEL WITH GFR
AG RATIO: 1.5 (calc) (ref 1.0–2.5)
ALBUMIN MSPROF: 4.3 g/dL (ref 3.6–5.1)
ALKALINE PHOSPHATASE (APISO): 54 U/L (ref 33–130)
ALT: 14 U/L (ref 6–29)
AST: 22 U/L (ref 10–35)
BILIRUBIN TOTAL: 0.5 mg/dL (ref 0.2–1.2)
BUN: 14 mg/dL (ref 7–25)
CHLORIDE: 103 mmol/L (ref 98–110)
CO2: 27 mmol/L (ref 20–32)
Calcium: 9.8 mg/dL (ref 8.6–10.4)
Creat: 0.83 mg/dL (ref 0.50–0.99)
GFR, EST AFRICAN AMERICAN: 85 mL/min/{1.73_m2} (ref >=60)
GFR, Est Non African American: 73 mL/min/{1.73_m2} (ref >=60)
GLOBULIN: 2.8 g/dL (ref 1.9–3.7)
GLUCOSE: 96 mg/dL (ref 65–99)
Potassium: 4 mmol/L (ref 3.5–5.3)
SODIUM: 138 mmol/L (ref 135–146)
TOTAL PROTEIN: 7.1 g/dL (ref 6.1–8.1)

## 2017-10-19 LAB — LIPID PANEL
CHOL/HDL RATIO: 2.1 (calc) (ref <5.0)
CHOLESTEROL: 199 mg/dL (ref <200)
HDL: 95 mg/dL (ref >50)
LDL CHOLESTEROL (CALC): 87 mg/dL
Non-HDL Cholesterol (Calc): 104 mg/dL (calc) (ref <130)
TRIGLYCERIDES: 82 mg/dL (ref <150)

## 2017-10-19 LAB — THYROID PANEL WITH TSH
Free Thyroxine Index: 2.4 (ref 1.4–3.8)
T3 UPTAKE: 33 % (ref 22–35)
T4, Total: 7.2 ug/dL (ref 5.1–11.9)
TSH: 3.44 mIU/L (ref 0.40–4.50)

## 2017-10-20 ENCOUNTER — Telehealth: Payer: Self-pay

## 2017-10-20 NOTE — Telephone Encounter (Signed)
-----   Message from Mar Daring, [place] sent at 10/20/2017  9:26 AM EST ----- All labs are within normal limits and stable.  Thanks! -JB

## 2017-10-20 NOTE — Telephone Encounter (Signed)
Patient advised as directed below.  Thanks,  -Omar Orrego 

## 2017-11-22 ENCOUNTER — Encounter: Payer: Self-pay | Admitting: Physical Therapy

## 2017-11-22 ENCOUNTER — Other Ambulatory Visit: Payer: Self-pay

## 2017-11-22 ENCOUNTER — Ambulatory Visit: Payer: Medicare Other | Attending: Gastroenterology | Admitting: Physical Therapy

## 2017-11-22 DIAGNOSIS — M545 Low back pain: Secondary | ICD-10-CM | POA: Insufficient documentation

## 2017-11-22 DIAGNOSIS — M533 Sacrococcygeal disorders, not elsewhere classified: Secondary | ICD-10-CM | POA: Diagnosis not present

## 2017-11-22 DIAGNOSIS — G8929 Other chronic pain: Secondary | ICD-10-CM | POA: Diagnosis not present

## 2017-11-22 DIAGNOSIS — R278 Other lack of coordination: Secondary | ICD-10-CM | POA: Diagnosis not present

## 2017-11-22 NOTE — Patient Instructions (Addendum)
  Clam Shell 45 Degrees   Lying with hips and knees bent 45, one pillow between knees and ankles. Lift knee with exhale. Be sure pelvis does not roll backward. Do not arch back. Do 15 times, each leg, 2 times per day.  http://ss.exer.us/75   Copyright  VHI. All rights reserved.    ________   Sitting with feet on floor, ribcage stacked on pelvis, less slouching    ________   Avoid straining pelvic floor, abdominal muscles , spine  Use log rolling technique instead of getting out of bed with your neck or the sit-up   Log rolling out of .bed  L  arm overhead  Raise hips and scoot hips to R   Drop knees to L,  scooting L shoulder back to get completely on your L side so your shoulders, hips, and knees point to the L    Then breathe as you drop feet off bed and prop onto L elbow and  use both hands to push yourself

## 2017-11-23 NOTE — Therapy (Signed)
[place] MAIN [facility] SERVICES [address] [place], [place], [address] Phone: [phone]   Fax:  [phone]  Physical Therapy Evaluation  Patient Details  Name: Barbara Gates MRN: [id number] Date of Birth: [birth year] Referring Provider: Josephina Gip PA   Encounter Date: 11/22/2017  PT End of Session - 11/23/17 2254    Visit Number  1    Number of Visits  12    Date for PT Re-Evaluation  02/13/18    PT Start Time  9371    PT Stop Time  1735    PT Time Calculation (min)  85 min       Past Medical History:  Diagnosis Date  . Hypercalcemia   . Hyperlipidemia    Has history of this  . Osteoporosis     Past Surgical History:  Procedure Laterality Date  . ABDOMINAL HYSTERECTOMY  1994   Total including ovarie. Due to endometriosis  . BREAST BIOPSY Left 11/08/2012   Dr. Bary Castilla, neg  . Surgical Lipoma Removal; 12/13/2012  12/13/2012    There were no vitals filed for this visit.   Subjective Assessment - 11/23/17 2239    Subjective 1) constipation:  Pt reports she has had constipation since childhood. Pt remembers her mom giving her laxatives on Friday night ( [employer]). Pt alwyas had hard stools and remembers having to use her finger to push her stool out. Pt has been taking stool softeners for the past 4 years ( Miralax), within past 3 years ( Magnesium), and within the 6 months ( Colace) which have softened her stools but she still has to strain.  Frequency of bowels with stool softeners occur every 3-5 days. Pt will go 2-3 days with bowel movements and then will constipated. Pt has been constipated sometiems for 3 weeks. Pt noticed her constipation was worse after her abdominal hysterectomy 1994. Prior to her hysterectomy, pt noticed her bowels will come easier with her menstrual cycle. Currently her stool consistency is Type 5-6.  Daily fluid intake ( water:  60-70 fl oz, coffee: 1 cup caffeinated, 1 cup decaf, 1  soda 12 floz).  Pt learned she was eating too much fiber. 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  Behavior patterns: Pt grew up with a family of 6 people with one bathroom.  Pt notices L rib pain with one epsiode last week with constipation which is relieved after bowel movements.    2) urinary leakage occurs with sudden urge.    3) CLBP has been occuring for 40 years. Pt noticed it when playing volleyball. Pt points to the center of low back. Pt notices the pain with R hip pain with standing for 10 min (  VAS level 5/10) .  Pt is no longer seeking DC treatments. Denied radiating pain.      Pertinent History  osteoprosis, abdominal hysterectomy due to endometriosis with accompanied abdominal pain, spotting. redundent colon. Family Hx of bowel problems. Physical activity: walk 1-2 mi x 2 days/ week. 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      Patient Stated Goals  Pt thinks there is a spot or a kink that collects her stool before it is able to go through. Pt would like to be "normal" and be able to complete bowel movements           [facility] PT Assessment - 11/23/17 2239      Assessment  Medical Diagnosis  constipation    Referring Provider  Josephina Gip PA      Precautions   Precautions  None      Restrictions   Weight Bearing Restrictions  No      Balance Screen   Has the patient fallen in the past 6 months  No      Prior Function   Level of Independence  Independent      Observation/Other Assessments   Observations  ASIS medial malleoli 92 cm B,       Coordination   Gross Motor Movements are Fluid and Coordinated  -- lateral excursion ribcage w/inhalation,ab straining w/BM cue      Single Leg Stance   Comments  L LE back with pelvic shift       Strength   Overall Strength  -- hip abd 3/5 B       Palpation   Spinal mobility  L rib more anterior than R, R lumbar convex curve      Palpation comment  L ASIS  higher than R, soft tissue restrictions upper medial quadrant and lower quadrant, medial aspect of abdominal scar restricted than distal ends  post Tx: ASIS levelled              Objective measurements completed on examination: See above findings.    Pelvic Floor Special Questions - 11/23/17 2252    Diastasis Recti  2 fingers along linea alba     External Perineal Exam  through clothing: no pelvic floor tensions        [facility] Adult PT Treatment/Exercise - 11/23/17 2253      Neuro Re-ed    Neuro Re-ed Details   see pt instructions      Manual Therapy   Manual therapy comments  rotational mob on L hip, PA mob along SIJ L              PT Education - 11/22/17 1726    Education provided  Yes    Education Details  POC, anaotmy/ physiology, goals, HEP, ways to minimize strain on spine. pelvic floor and abdominal mm     Person(s) Educated  Patient    Methods  Explanation;Demonstration;Tactile cues;Verbal cues;Handout    Comprehension  Verbalized understanding          PT Long Term Goals - 11/22/17 1645      PT LONG TERM GOAL #1   Title  Pt will be able to eliminate bowel movements of consistency of Type 4-5 across 75% of time instead of Type 6-7 in order to minimize straining    Time  10    Period  Weeks    Status  New    Target Date  01/31/18      PT LONG TERM GOAL #2   Title  Pt will able to decrease COREFO score from % to < % in order to improve GI function    Time  12    Period  Weeks    Status  New    Target Date  02/14/18      PT LONG TERM GOAL #3   Title  Pt will report decreased LBP / R hip pain with standing from 10 min ( VAS level 5/10) to >30 min ( < 2/10 ) in order to particiapte in community events  and to volunteer at [employer]    Time  8    Period  Weeks    Status  New  Target Date  02/14/17      PT LONG TERM GOAL #4   Title  Pt will dem odecreased abdominal separation from 2 fingers width along linea alba to < 1 fingers width in order  to promote motility and postural stability     Time  4    Period  Weeks    Status  New    Target Date  12/20/17      PT LONG TERM GOAL #5   Title  Pt will demo no pelvic obliquities and report no increased pain at L rib that presents prior to elimination of bowel movements in order to improve QOL     Time  2    Period  Weeks    Status  New    Target Date  12/07/17      Additional Long Term Goals   Additional Long Term Goals  Yes      PT LONG TERM GOAL #6   Title  Pt will increase her hip abduction strength from 3/5 B to > 4/5  in order to climb stairs at home with minimize risk for injuries     Time  10    Period  Weeks    Status  New    Target Date  02/01/18      PT LONG TERM GOAL #7   Title  Pt will report being able to not leak prior to making it to the bathroom in order to travel    Time  12    Period  Weeks    Status  New    Target Date  02/15/18             Plan - 11/23/17 2255    Clinical Impression Statement Pt is a 67 yo female who reports chronic constipation, urge incontinence, and CLBP with R hip pain. These deficits impact her QOL and ADLs.  Pt 's clinical presentations include the following signs of poor intraabdominal pressure system which impacts GI motility and postural stability: diastasis recti, R lumbar scoliotic curve, unequal iliac crest height, abdominal scar restrictions,  dyscoordination of pelvic floor and deep core mm,decreased hip strength, poor body mechanics that strain her spine, abdomen, and pelvic floor. Following pt's Tx today, pt showed more equallly aligned iliac crest and demo'd improved body mechanics to minimize straining of abdominal and pelvic floor mm.      History and Personal Factors relevant to plan of care:  osteoporosis, abdominal hysterectomy due to endometriosis with accompanied abdominal pain, spotting. redundent colon. Family Hx of bowel problems. Physical activity: walk 1-2 mi x 2 days/ week. 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      Clinical Presentation  Evolving    Clinical Decision Making  High    Rehab Potential  Good    PT Frequency  1x / week    PT Duration  12 weeks    PT Treatment/Interventions  Neuromuscular re-education;Therapeutic activities;Therapeutic exercise;Moist Heat;Patient/family education;Manual techniques;Functional mobility training;Stair training    Consulted and Agree with Plan of Care  Patient       Patient will benefit from skilled therapeutic intervention in order to improve the following deficits and impairments:  Decreased safety awareness, Decreased coordination, Decreased knowledge of use of DME, Decreased strength, Increased fascial restricitons, Decreased scar mobility, Decreased range of motion, Decreased cognition, Decreased mobility, Decreased endurance, Decreased balance, Increased muscle spasms, Postural dysfunction, Pain, Improper body mechanics  Visit Diagnosis: Other lack of coordination  Sacrococcygeal disorders, not elsewhere classified  Chronic right-sided low back pain, with sciatica presence unspecified     Problem List Patient Active Problem List   Diagnosis Date Noted  . Hypercalcemia 12/30/2015  . Memory impairment of gradual onset 12/30/2015  . Abnormal blood chemistry 06/11/2015  . Adult idiopathic generalized osteoporosis 06/11/2015  . Colon polyp 06/11/2015  . Closed fracture of proximal end of radius 06/11/2015  . Auditory impairment 06/11/2015  . Blood in the urine 06/11/2015  . Hypercholesteremia 06/11/2015  . Gonalgia 06/11/2015  . Herpes zona 06/11/2015  . Female genuine stress incontinence 06/11/2015  . Chronic constipation 09/02/2014  . Lipoma of back-left 07/18/2012    Jerl Mina ,PT, DPT, E-RYT  11/23/2017, 11:13 PM  [place] MAIN [facility] SERVICES [address] [place], [place], [address] Phone: [phone]   Fax:   [phone]  Name: Barbara Gates MRN: [id number] Date of Birth: 1951-04-29

## 2017-11-28 ENCOUNTER — Ambulatory Visit: Payer: BLUE CROSS/BLUE SHIELD | Admitting: Physical Therapy

## 2017-11-29 ENCOUNTER — Encounter: Payer: Medicare Other | Admitting: Physical Therapy

## 2017-11-30 ENCOUNTER — Encounter: Payer: Medicare Other | Admitting: Physical Therapy

## 2017-11-30 ENCOUNTER — Ambulatory Visit: Payer: Medicare Other | Admitting: Physical Therapy

## 2017-11-30 DIAGNOSIS — M533 Sacrococcygeal disorders, not elsewhere classified: Secondary | ICD-10-CM | POA: Diagnosis not present

## 2017-11-30 DIAGNOSIS — R278 Other lack of coordination: Secondary | ICD-10-CM

## 2017-11-30 DIAGNOSIS — M545 Low back pain: Secondary | ICD-10-CM

## 2017-11-30 DIAGNOSIS — G8929 Other chronic pain: Secondary | ICD-10-CM | POA: Diagnosis not present

## 2017-11-30 NOTE — Therapy (Signed)
[place] MAIN [facility] SERVICES [address] [place], [place], [address] Phone: [phone]   Fax:  [phone]  Physical Therapy Treatment  Patient Details  Name: Barbara Gates MRN: [id number] Date of Birth: January 25, 1951 Referring Provider: Josephina Gip PA   Encounter Date: 11/30/2017  PT End of Session - 11/30/17 1526    Visit Number  2    Number of Visits  12    Date for PT Re-Evaluation  02/13/18    PT Start Time  1500    PT Stop Time  4944    PT Time Calculation (min)  64 min       Past Medical History:  Diagnosis Date  . Hypercalcemia   . Hyperlipidemia    Has history of this  . Osteoporosis     Past Surgical History:  Procedure Laterality Date  . ABDOMINAL HYSTERECTOMY  1994   Total including ovarie. Due to endometriosis  . BREAST BIOPSY Left 11/08/2012   Dr. Bary Castilla, neg  . Surgical Lipoma Removal; 12/13/2012  12/13/2012    There were no vitals filed for this visit.  Subjective Assessment - 11/30/17 1509    Subjective  Pt reported L hip felt sore after the last session for 3 days. Pt tried sleepign with a pillow between knees     Pertinent History  osteoprosis, abdominal hysterectomy due to endometriosis with accompanied abdominal pain, spotting. redundent colon. Family Hx of bowel problems. Physical activity: walk 1-2 mi x 2 days/ week. 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      Patient Stated Goals  Pt thinks there is a spot or a kink that collects her stool before it is able to go through. Pt would like to be "normal" and be able to complete bowel movements           [facility] PT Assessment - 11/30/17 1509      Single Leg Stance   Comments  L LE back without  pelvic shift       Palpation   Palpation comment  ASIS levelled                Pelvic Floor Special Questions - 11/30/17 1528    Diastasis Recti  1 fingers width post  Tx today    Pelvic Floor Internal Exam  Pt consented verbally without contraindications     Exam Type  Rectal    Palpation  increased posterior puborectalis/ EAS ( decreased post Tx) ,  ldelayed lengthening. posterior tilt in sidelying .         [place] Adult PT Treatment/Exercise - 11/30/17 1528      Neuro Re-ed    Neuro Re-ed Details   see pt instructions postural training       Manual Therapy   Manual therapy comments  quadriped position: fascial releases at abdominal mm w/ shoudler flexion and spinal rotation              PT Education - 11/30/17 1526    Education provided  Yes    Education Details  HEP    Person(s) Educated  Patient    Methods  Explanation;Demonstration;Tactile cues;Verbal cues;Handout    Comprehension  Returned demonstration;Verbalized understanding          PT Long Term Goals - 11/22/17 1645      PT LONG TERM GOAL #1   Title  Pt will be able to eliminate bowel  movements of consistency of Type 4-5 across 75% of time instead of Type 6-7 in order to minimize straining    Time  10    Period  Weeks    Status  New    Target Date  01/31/18      PT LONG TERM GOAL #2   Title  Pt will able to decrease COREFO score from 20% to < 15% in order to improve GI function    Time  12    Period  Weeks    Status  New    Target Date  02/14/18      PT LONG TERM GOAL #3   Title  Pt will report decreased LBP / R hip pain with standing from 10 min ( VAS level 5/10) to >30 min ( < 2/10 ) in order to particiapte in community events  and to volunteer at [employer]    Time  8    Period  Weeks    Status  New    Target Date  02/14/17      PT LONG TERM GOAL #4   Title  Pt will dem odecreased abdominal separation from 2 fingers width along linea alba to < 1 fingers width in order to promote motility and postural stability     Time  4    Period  Weeks    Status  New    Target Date  12/20/17      PT LONG TERM GOAL #5   Title  Pt will demo no pelvic obliquities and  report no increased pain at L rib that presents prior to elimination of bowel movements in order to improve QOL     Time  2    Period  Weeks    Status  New    Target Date  12/07/17      Additional Long Term Goals   Additional Long Term Goals  Yes      PT LONG TERM GOAL #6   Title  Pt will increase her hip abduction strength from 3/5 B to > 4/5  in order to climb stairs at home with minimize risk for injuries     Time  10    Period  Weeks    Status  New    Target Date  02/01/18      PT LONG TERM GOAL #7   Title  Pt will report being able to not leak prior to making it to the bathroom in order to travel    Time  12    Period  Weeks    Status  New    Target Date  02/15/18            Plan - 11/30/17 1552    Clinical Impression Statement  pt demo'd decreased abdominal separation and pelvic mm tightness at posterior external anal sphnicter and puborectalis mm following Tx today, Pt required cues for prioceptionof pelvis to elicit anterior tilt. Pt will continue to benefit from skilled PT.      Rehab Potential  Good    PT Frequency  1x / week    PT Duration  12 weeks    PT Treatment/Interventions  Neuromuscular re-education;Therapeutic activities;Therapeutic exercise;Moist Heat;Patient/family education;Manual techniques;Functional mobility training;Stair training    Consulted and Agree with Plan of Care  Patient       Patient will benefit from skilled therapeutic intervention in order to improve the following deficits and impairments:  Decreased safety awareness, Decreased coordination, Decreased knowledge of use  of DME, Decreased strength, Increased fascial restricitons, Decreased scar mobility, Decreased range of motion, Decreased cognition, Decreased mobility, Decreased endurance, Decreased balance, Increased muscle spasms, Postural dysfunction, Pain, Improper body mechanics  Visit Diagnosis: Other lack of coordination  Sacrococcygeal disorders, not elsewhere  classified  Chronic right-sided low back pain, with sciatica presence unspecified     Problem List Patient Active Problem List   Diagnosis Date Noted  . Hypercalcemia 12/30/2015  . Memory impairment of gradual onset 12/30/2015  . Abnormal blood chemistry 06/11/2015  . Adult idiopathic generalized osteoporosis 06/11/2015  . Colon polyp 06/11/2015  . Closed fracture of proximal end of radius 06/11/2015  . Auditory impairment 06/11/2015  . Blood in the urine 06/11/2015  . Hypercholesteremia 06/11/2015  . Gonalgia 06/11/2015  . Herpes zona 06/11/2015  . Female genuine stress incontinence 06/11/2015  . Chronic constipation 09/02/2014  . Lipoma of back-left 07/18/2012    Jerl Mina ,PT, DPT, E-RYT  11/30/2017, 4:05 PM  [place] MAIN [facility] SERVICES [address] [place], [place], [address] Phone: [phone]   Fax:  [phone]  Name: Barbara Gates MRN: [id number] Date of Birth: 29-Aug-1951

## 2017-11-30 NOTE — Patient Instructions (Addendum)
Deep core level 1 and 2 ( handout)  Use a thicker pillow between knees for sleeping in order to keep feet hip width apart    Pelvic tilt in standing, sitting, and sidelying 10 rep x 3   Mini squat ( 10 reps x 3 day ) to get anterior tilt of pelvis and strengthening gluts

## 2017-12-05 ENCOUNTER — Encounter: Payer: BLUE CROSS/BLUE SHIELD | Admitting: Physical Therapy

## 2017-12-06 DIAGNOSIS — H40013 Open angle with borderline findings, low risk, bilateral: Secondary | ICD-10-CM | POA: Diagnosis not present

## 2017-12-07 ENCOUNTER — Encounter: Payer: Medicare Other | Admitting: Physical Therapy

## 2017-12-12 ENCOUNTER — Encounter: Payer: BLUE CROSS/BLUE SHIELD | Admitting: Physical Therapy

## 2017-12-14 ENCOUNTER — Ambulatory Visit: Payer: Medicare Other | Admitting: Physical Therapy

## 2017-12-14 DIAGNOSIS — G8929 Other chronic pain: Secondary | ICD-10-CM | POA: Diagnosis not present

## 2017-12-14 DIAGNOSIS — M545 Low back pain: Secondary | ICD-10-CM | POA: Diagnosis not present

## 2017-12-14 DIAGNOSIS — M533 Sacrococcygeal disorders, not elsewhere classified: Secondary | ICD-10-CM | POA: Diagnosis not present

## 2017-12-14 DIAGNOSIS — R278 Other lack of coordination: Secondary | ICD-10-CM

## 2017-12-14 NOTE — Patient Instructions (Signed)
Abdominal massage upward from L,  R , center to belly button 3 stroke x 3 , pressure is gentle and light with all fingers flat, not using finger tips    Stop the mini squats

## 2017-12-14 NOTE — Therapy (Addendum)
[place] MAIN [facility] SERVICES [address] [place], [place], [address] Phone: [phone]   Fax:  [phone]  Physical Therapy Treatment  Patient Details  Name: Barbara Gates MRN: [id number] Date of Birth: 1951/07/26 Referring Provider: Josephina Gip PA   Encounter Date: 12/14/2017  PT End of Session - 12/14/17 1200    Visit Number  3    Number of Visits  12    Date for PT Re-Evaluation  02/13/18    PT Start Time  1108    PT Stop Time  1205    PT Time Calculation (min)  57 min       Past Medical History:  Diagnosis Date  . Hypercalcemia   . Hyperlipidemia    Has history of this  . Osteoporosis     Past Surgical History:  Procedure Laterality Date  . ABDOMINAL HYSTERECTOMY  1994   Total including ovarie. Due to endometriosis  . BREAST BIOPSY Left 11/08/2012   Dr. Bary Castilla, neg  . Surgical Lipoma Removal; 12/13/2012  12/13/2012    There were no vitals filed for this visit.  Subjective Assessment - 12/14/17 1153    Subjective  Pt reported performing her exercises.          [facility] PT Assessment - 12/14/17 1156      Palpation   SI assessment   ASIS levelled     Palpation comment  increased fascial tensions suprapubic area and over pubic symphysis  ( decreased post Tx)                Pelvic Floor Special Questions - 12/14/17 1157    Diastasis Recti  1 fingers width post Tx today    Pelvic Floor Internal Exam  Pt consented verbally without contraindications     Exam Type  Vaginal    Palpation  signifciant tightness posterior muscle mm B with delayed lengthening  ( post Tx decreased)     Strength  fair squeeze, definite lift        [facility] Adult PT Treatment/Exercise - 12/14/17 1158      Neuro Re-ed    Neuro Re-ed Details   see pt instructions postural training       Manual Therapy   Manual therapy comments  fascial releases over supra/ pubic areas B     Internal Pelvic Floor  thiele massage at  problem areas              PT Education - 12/14/17 1200    Education provided  Yes    Education Details  HEP    Person(s) Educated  Patient    Methods  Explanation;Demonstration;Tactile cues;Verbal cues;Handout    Comprehension  Returned demonstration;Verbalized understanding          PT Long Term Goals - 12/14/17 2054      PT LONG TERM GOAL #1   Title  Pt will be able to eliminate bowel movements of consistency of Type 4-5 across 75% of time instead of Type 6-7 in order to minimize straining    Time  10    Period  Weeks    Status  On-going      PT LONG TERM GOAL #2   Title  Pt will able to decrease COREFO score from 20% to < 15% in order to improve GI function    Time  12    Period  Weeks    Status  On-going      PT LONG TERM GOAL #  3   Title  Pt will report decreased LBP / R hip pain with standing from 10 min ( VAS level 5/10) to >30 min ( < 2/10 ) in order to particiapte in community events  and to volunteer at [employer]    Time  8    Period  Weeks    Status  On-going      PT LONG TERM GOAL #4   Title  Pt will dem odecreased abdominal separation from 2 fingers width along linea alba to < 1 fingers width in order to promote motility and postural stability     Time  4    Period  Weeks    Status  Achieved      PT LONG TERM GOAL #5   Title  Pt will demo no pelvic obliquities and report no increased pain at L rib that presents prior to elimination of bowel movements in order to improve QOL     Time  2    Period  Weeks    Status  Achieved      PT LONG TERM GOAL #6   Title  Pt will increase her hip abduction strength from 3/5 B to > 4/5  in order to climb stairs at home with minimize risk for injuries     Time  10    Period  Weeks    Status  On-going      PT LONG TERM GOAL #7   Title  Pt will report being able to not leak prior to making it to the bathroom in order to travel    Time  12    Period  Weeks    Status  On-going      PT LONG TERM GOAL #8    Title  Pt will demo no pelvic floor tightness across 2 visits in order promote pelvic floor ROM for better function    Time  6    Period  Weeks    Status  New    Target Date  01/25/18            Plan - 12/14/17 2057    Clinical Impression Statement Pt showed good carry over with resolved diastasis recti and symmetrical pelvic girdle alignment which indicates her intraabdominal pressure system is functioning better to yield better outcomes when pt undergoes coordination training for proper pelvic floor lengthening. Pt today showed decreased pelvic floor mm and supra pubic mm/ fascial  tensions and tenderness following manual Tx. Pt demo's improved pelvic floor ROM post Tx. Pt continues to benefit from skilled PT.     Rehab Potential  Good    PT Frequency  1x / week    PT Duration  12 weeks    PT Treatment/Interventions  Neuromuscular re-education;Therapeutic activities;Therapeutic exercise;Moist Heat;Patient/family education;Manual techniques;Functional mobility training;Stair training    Consulted and Agree with Plan of Care  Patient       Patient will benefit from skilled therapeutic intervention in order to improve the following deficits and impairments:  Decreased safety awareness, Decreased coordination, Decreased knowledge of use of DME, Decreased strength, Increased fascial restricitons, Decreased scar mobility, Decreased range of motion, Decreased cognition, Decreased mobility, Decreased endurance, Decreased balance, Increased muscle spasms, Postural dysfunction, Pain, Improper body mechanics  Visit Diagnosis: Other lack of coordination  Sacrococcygeal disorders, not elsewhere classified  Chronic right-sided low back pain, with sciatica presence unspecified     Problem List Patient Active Problem List   Diagnosis Date Noted  . Hypercalcemia 12/30/2015  .  Memory impairment of gradual onset 12/30/2015  . Abnormal blood chemistry 06/11/2015  . Adult idiopathic generalized  osteoporosis 06/11/2015  . Colon polyp 06/11/2015  . Closed fracture of proximal end of radius 06/11/2015  . Auditory impairment 06/11/2015  . Blood in the urine 06/11/2015  . Hypercholesteremia 06/11/2015  . Gonalgia 06/11/2015  . Herpes zona 06/11/2015  . Female genuine stress incontinence 06/11/2015  . Chronic constipation 09/02/2014  . Lipoma of back-left 07/18/2012    Jerl Mina ,PT, DPT, E-RYT  12/14/2017, 8:57 PM  [place] MAIN [facility] SERVICES [address] [place], [place], [address] Phone: [phone]   Fax:  [phone]  Name: Barbara Gates MRN: [id number] Date of Birth: 18-Jul-1951

## 2017-12-15 DIAGNOSIS — M8588 Other specified disorders of bone density and structure, other site: Secondary | ICD-10-CM | POA: Diagnosis not present

## 2017-12-15 DIAGNOSIS — E2839 Other primary ovarian failure: Secondary | ICD-10-CM | POA: Diagnosis not present

## 2017-12-15 DIAGNOSIS — M859 Disorder of bone density and structure, unspecified: Secondary | ICD-10-CM | POA: Diagnosis not present

## 2017-12-15 DIAGNOSIS — Z780 Asymptomatic menopausal state: Secondary | ICD-10-CM | POA: Diagnosis not present

## 2017-12-15 DIAGNOSIS — M810 Age-related osteoporosis without current pathological fracture: Secondary | ICD-10-CM | POA: Diagnosis not present

## 2017-12-15 LAB — HM DEXA SCAN

## 2017-12-19 ENCOUNTER — Encounter: Payer: Self-pay | Admitting: Physician Assistant

## 2017-12-19 ENCOUNTER — Other Ambulatory Visit: Payer: Self-pay

## 2017-12-19 ENCOUNTER — Ambulatory Visit: Payer: Medicare Other | Admitting: Physical Therapy

## 2017-12-19 DIAGNOSIS — R278 Other lack of coordination: Secondary | ICD-10-CM

## 2017-12-19 DIAGNOSIS — M545 Low back pain: Secondary | ICD-10-CM | POA: Diagnosis not present

## 2017-12-19 DIAGNOSIS — M533 Sacrococcygeal disorders, not elsewhere classified: Secondary | ICD-10-CM

## 2017-12-19 DIAGNOSIS — G8929 Other chronic pain: Secondary | ICD-10-CM | POA: Diagnosis not present

## 2017-12-19 DIAGNOSIS — M810 Age-related osteoporosis without current pathological fracture: Secondary | ICD-10-CM

## 2017-12-19 NOTE — Telephone Encounter (Signed)
Patient advised of BMD results. Patient wants to know if she continues to take EVISTA 60 mg or just OTC vitamins.

## 2017-12-19 NOTE — Patient Instructions (Signed)
Practice proper pelvic floor coordination  Inhale: expand pelvic floor mm Exhale" "j" scoop, allow pelvic floor to close, lift first before belly sinks   ( not "draw abdominal muscle to spine" or strain with abdominal muscles") '   _________   Make sure to do the 1-2 count with a PAUSE at the end of inhale Then 2-1 count with a PAUSE at the top of exhale   Less effort at the abs or ribs    __________  Do not raise heels up with toileting, make sure the feet are flat

## 2017-12-19 NOTE — Therapy (Signed)
[place] MAIN [facility] SERVICES [address] [place], [place], [address] Phone: [phone]   Fax:  [phone]  Physical Therapy Treatment  Patient Details  Name: Barbara Gates MRN: [id number] Date of Birth: Mar 26, 1951 Referring Provider: Josephina Gip PA   Encounter Date: 12/19/2017    Past Medical History:  Diagnosis Date  . Hypercalcemia   . Hyperlipidemia    Has history of this  . Osteoporosis     Past Surgical History:  Procedure Laterality Date  . ABDOMINAL HYSTERECTOMY  1994   Total including ovarie. Due to endometriosis  . BREAST BIOPSY Left 11/08/2012   Dr. Bary Castilla, neg  . Surgical Lipoma Removal; 12/13/2012  12/13/2012    There were no vitals filed for this visit.  Subjective Assessment - 12/19/17 1108    Subjective  Pt is noticing she is breathing in different posture. She felt the lengthening of her pelvic floor once after last session but was not able to repeat it.  Pt reported she has been have more bowel movements than before                  Pelvic Floor Special Questions - 12/19/17 1348    Diastasis Recti  no separation    Pelvic Floor Internal Exam  Pt consented verbally without contraindications     Exam Type  Vaginal    Palpation  signifciant tightness at bulbospongious, superficial transverse perineal 3-9 o clock. deep mm with no tensions compared to last session. demo'd signficantly improved cranial motion of pelvic floor with exhale following biofeedback training with tactile/ verbal and visual cues . decreased excessive effort of upper abdominals      Strength  good squeeze, good lift, able to hold agaisnt strong resistance        [facility] Adult PT Treatment/Exercise - 12/19/17 1348      Neuro Re-ed    Neuro Re-ed Details   see pt instructions      Manual Therapy   Internal Pelvic Floor  thiele massage at problem areas with mobilization of suprapubic fascia and over pubic  symphysis. B           External manual Tx: posterior ribs/ intercostals, fascial release over sternum to promote anterior and lateral excursion of ribcage         PT Long Term Goals - 12/14/17 2054      PT LONG TERM GOAL #1   Title  Pt will be able to eliminate bowel movements of consistency of Type 4-5 across 75% of time instead of Type 6-7 in order to minimize straining    Time  10    Period  Weeks    Status  On-going      PT LONG TERM GOAL #2   Title  Pt will able to decrease COREFO score from 20% to < 15% in order to improve GI function    Time  12    Period  Weeks    Status  On-going      PT LONG TERM GOAL #3   Title  Pt will report decreased LBP / R hip pain with standing from 10 min ( VAS level 5/10) to >30 min ( < 2/10 ) in order to particiapte in community events  and to volunteer at [employer]    Time  8    Period  Weeks    Status  On-going      PT LONG TERM GOAL #4   Title  Pt will dem odecreased abdominal separation from 2 fingers width along linea alba to < 1 fingers width in order to promote motility and postural stability     Time  4    Period  Weeks    Status  Achieved      PT LONG TERM GOAL #5   Title  Pt will demo no pelvic obliquities and report no increased pain at L rib that presents prior to elimination of bowel movements in order to improve QOL     Time  2    Period  Weeks    Status  Achieved      PT LONG TERM GOAL #6   Title  Pt will increase her hip abduction strength from 3/5 B to > 4/5  in order to climb stairs at home with minimize risk for injuries     Time  10    Period  Weeks    Status  On-going      PT LONG TERM GOAL #7   Title  Pt will report being able to not leak prior to making it to the bathroom in order to travel    Time  12    Period  Weeks    Status  On-going      PT LONG TERM GOAL #8   Title  Pt will demo no pelvic floor tightness across 2 visits in order promote pelvic floor ROM for better function    Time  6     Period  Weeks    Status  New    Target Date  01/25/18            Plan - 12/19/17 1636    Clinical Impression Statement Pt is progressing well towards her goals. Pt reports she has been able to have more bowel movements than before. Pt demo'd resolved diastasis recti, corrected alignment of her pelvic joints, increased excursion of diaphragm and pelvic floor mm, decreased pelvic floor mm tightness and abdominal scar restrictions. Pt demo'd proper coordination of her pelvic floor in a circumferential and sequential manner today following biofeedback w/ tactile, visual, and verbal cues by PT. Discussed with pt the use of biofeedback machine training once she is ready for it by demonstrating no more need for manual Tx. Pt reported she is gaining the awareness of lengthening the pelvic floor and expansion of her diaphragm more and more as she practices during the day.  Pt continues to benefit from skilled PT.       Rehab Potential  Good    PT Frequency  1x / week    PT Duration  12 weeks    PT Treatment/Interventions  Neuromuscular re-education;Therapeutic activities;Therapeutic exercise;Moist Heat;Patient/family education;Manual techniques;Functional mobility training;Stair training    Consulted and Agree with Plan of Care  Patient       Patient will benefit from skilled therapeutic intervention in order to improve the following deficits and impairments:  Decreased safety awareness, Decreased coordination, Decreased knowledge of use of DME, Decreased strength, Increased fascial restricitons, Decreased scar mobility, Decreased range of motion, Decreased cognition, Decreased mobility, Decreased endurance, Decreased balance, Increased muscle spasms, Postural dysfunction, Pain, Improper body mechanics  Visit Diagnosis: Other lack of coordination  Sacrococcygeal disorders, not elsewhere classified  Chronic right-sided low back pain, with sciatica presence unspecified     Problem List Patient  Active Problem List   Diagnosis Date Noted  . Hypercalcemia 12/30/2015  . Memory impairment of gradual onset 12/30/2015  . Abnormal blood chemistry 06/11/2015  .  Adult idiopathic generalized osteoporosis 06/11/2015  . Colon polyp 06/11/2015  . Closed fracture of proximal end of radius 06/11/2015  . Auditory impairment 06/11/2015  . Blood in the urine 06/11/2015  . Hypercholesteremia 06/11/2015  . Gonalgia 06/11/2015  . Herpes zona 06/11/2015  . Female genuine stress incontinence 06/11/2015  . Chronic constipation 09/02/2014  . Lipoma of back-left 07/18/2012    Jerl Mina ,PT, DPT, E-RYT  12/19/2017, 4:36 PM  [place] MAIN [facility] SERVICES [address] [place], [place], [address] Phone: [phone]   Fax:  [phone]  Name: [name] MRN: [id number] Date of Birth: 07/13/1951

## 2017-12-20 MED ORDER — RALOXIFENE HCL 60 MG PO TABS: 60.0000 mg | ORAL_TABLET | Freq: Every day | ORAL | 1 refills | Status: DC

## 2017-12-20 NOTE — Telephone Encounter (Signed)
Patient advised as below.  

## 2017-12-20 NOTE — Telephone Encounter (Signed)
Continue Evista also

## 2017-12-26 DIAGNOSIS — K5902 Outlet dysfunction constipation: Secondary | ICD-10-CM | POA: Diagnosis not present

## 2017-12-26 DIAGNOSIS — Z6820 Body mass index (BMI) 20.0-20.9, adult: Secondary | ICD-10-CM | POA: Diagnosis not present

## 2017-12-27 ENCOUNTER — Ambulatory Visit: Payer: Medicare Other | Admitting: Physical Therapy

## 2017-12-27 NOTE — Telephone Encounter (Signed)
Visit completed.

## 2018-01-02 ENCOUNTER — Encounter: Payer: BLUE CROSS/BLUE SHIELD | Admitting: Physical Therapy

## 2018-01-03 ENCOUNTER — Ambulatory Visit: Payer: Medicare Other | Attending: Gastroenterology

## 2018-01-03 ENCOUNTER — Encounter: Payer: Medicare Other | Admitting: Physical Therapy

## 2018-01-03 DIAGNOSIS — G8929 Other chronic pain: Secondary | ICD-10-CM | POA: Insufficient documentation

## 2018-01-03 DIAGNOSIS — R278 Other lack of coordination: Secondary | ICD-10-CM | POA: Diagnosis not present

## 2018-01-03 DIAGNOSIS — M533 Sacrococcygeal disorders, not elsewhere classified: Secondary | ICD-10-CM

## 2018-01-03 DIAGNOSIS — M545 Low back pain: Secondary | ICD-10-CM | POA: Insufficient documentation

## 2018-01-03 NOTE — Therapy (Signed)
[place] MAIN [facility] SERVICES [address] [place], [place], [address] Phone: [phone]   Fax:  [phone]  Physical Therapy Treatment  Patient Details  Name: Barbara Gates MRN: [id number] Date of Birth: 12/27/1950 Referring Provider: Josephina Gip PA   Encounter Date: 01/03/2018  PT End of Session - 01/06/18 1624    Visit Number  4    Number of Visits  12    Date for PT Re-Evaluation  02/13/18    PT Start Time  1109    PT Stop Time  1222    PT Time Calculation (min)  73 min    Activity Tolerance  Patient tolerated treatment well    Behavior During Therapy  [facility] for tasks assessed/performed       Past Medical History:  Diagnosis Date  . Hypercalcemia   . Hyperlipidemia    Has history of this  . Osteoporosis     Past Surgical History:  Procedure Laterality Date  . ABDOMINAL HYSTERECTOMY  1994   Total including ovarie. Due to endometriosis  . BREAST BIOPSY Left 11/08/2012   Dr. Bary Castilla, neg  . Surgical Lipoma Removal; 12/13/2012  12/13/2012    There were no vitals filed for this visit.   Pelvic Floor Physical Therapy Treatment Note  SCREENING  Changes in medications, allergies, or medical history?: no   SUBJECTIVE  Patient reports: Having daily BM's at bristol stool scale 5, has mild low back pain with extended standing, taking 200 mg. Of ibuprofen in the morning only. Just had a bone density test and had osteoporosis in her R femur, osteopenia in the low back. Has a redundant colon. Has familial history of constipation with often leading to surgical intervention.  Pain update:  Location of pain: R hip and low back, and knees Current pain:  0/10  Max pain:  3/10 Least pain:  0/10 Nature of pain: sharp  Patient Goals: Bee able to decrease constipation and be regular without requiring medications.   OBJECTIVE  Changes in: Posture/Observations:  Upright, seated posture with effort.  INTERVENTIONS  THIS SESSION: Self care: Educated on the fact that she likely has more than one cause for her constipation and how we would be addressing the different causes with different techniques potentially including colonic massage and e-stim for slow transit, and TP relaease, coordination training, and biofeedback with NM re-ed for outlet dysfunction. Recommended that she continue to perform all exercises given by her prior PT. NM re-ed: explained the purpose of and methods for use of biofeedback and allowed patient to use external sensors on her forearm to become accustomed to it before inserting vaginal sensor. Patient guided with use of imagery and verbal feedback primarily with some tactile feedback as well for TA recruitment. Worked on gentle contraction to increase awareness of PFM followed by full relaxation down with inhalation. Biofeedback equipment was being temperamental at one point, patient not being billed for this time.    Total time: 73 min (billing for only 60)                         PT Education - 01/06/18 1623    Education provided  Yes    Education Details  see Pt instructions and interventions this session    Person(s) Educated  Patient    Methods  Explanation;Demonstration;Handout;Verbal cues;Other (comment) biofeedback    Comprehension  Verbalized understanding;Returned demonstration;Verbal cues required;Tactile cues required;Need further instruction  PT Long Term Goals - 12/14/17 2054      PT LONG TERM GOAL #1   Title  Pt will be able to eliminate bowel movements of consistency of Type 4-5 across 75% of time instead of Type 6-7 in order to minimize straining    Time  10    Period  Weeks    Status  On-going      PT LONG TERM GOAL #2   Title  Pt will able to decrease COREFO score from 20% to < 15% in order to improve GI function    Time  12    Period  Weeks    Status  On-going      PT LONG TERM GOAL #3   Title  Pt will report decreased  LBP / R hip pain with standing from 10 min ( VAS level 5/10) to >30 min ( < 2/10 ) in order to particiapte in community events  and to volunteer at [employer]    Time  8    Period  Weeks    Status  On-going      PT LONG TERM GOAL #4   Title  Pt will dem odecreased abdominal separation from 2 fingers width along linea alba to < 1 fingers width in order to promote motility and postural stability     Time  4    Period  Weeks    Status  Achieved      PT LONG TERM GOAL #5   Title  Pt will demo no pelvic obliquities and report no increased pain at L rib that presents prior to elimination of bowel movements in order to improve QOL     Time  2    Period  Weeks    Status  Achieved      PT LONG TERM GOAL #6   Title  Pt will increase her hip abduction strength from 3/5 B to > 4/5  in order to climb stairs at home with minimize risk for injuries     Time  10    Period  Weeks    Status  On-going      PT LONG TERM GOAL #7   Title  Pt will report being able to not leak prior to making it to the bathroom in order to travel    Time  12    Period  Weeks    Status  On-going      PT LONG TERM GOAL #8   Title  Pt will demo no pelvic floor tightness across 2 visits in order promote pelvic floor ROM for better function    Time  6    Period  Weeks    Status  New    Target Date  01/25/18            Plan - 01/06/18 1626    Clinical Impression Statement  Pt was able to use biofeedback along with verbal tactile feedback to help her down-regulate the tension of the PFM enough that the sensor almost came out with her "bulge" in hook-lying. Continue per POC.    Clinical Presentation  Stable    Clinical Decision Making  High    Rehab Potential  Good    PT Frequency  1x / week    PT Duration  12 weeks    PT Treatment/Interventions  Neuromuscular re-education;Therapeutic activities;Therapeutic exercise;Moist Heat;Patient/family education;Manual techniques;Functional mobility training;Stair  training;Biofeedback    PT Next Visit Plan  practice ILY massage, perform pelvic  tilts on ball, work on biofeedback in seated.     Consulted and Agree with Plan of Care  Patient       Patient will benefit from skilled therapeutic intervention in order to improve the following deficits and impairments:  Decreased safety awareness, Decreased coordination, Decreased knowledge of use of DME, Decreased strength, Increased fascial restricitons, Decreased scar mobility, Decreased range of motion, Decreased cognition, Decreased mobility, Decreased endurance, Decreased balance, Increased muscle spasms, Postural dysfunction, Pain, Improper body mechanics  Visit Diagnosis: Other lack of coordination  Sacrococcygeal disorders, not elsewhere classified  Chronic right-sided low back pain, with sciatica presence unspecified     Problem List Patient Active Problem List   Diagnosis Date Noted  . Hypercalcemia 12/30/2015  . Memory impairment of gradual onset 12/30/2015  . Abnormal blood chemistry 06/11/2015  . Adult idiopathic generalized osteoporosis 06/11/2015  . Colon polyp 06/11/2015  . Closed fracture of proximal end of radius 06/11/2015  . Auditory impairment 06/11/2015  . Blood in the urine 06/11/2015  . Hypercholesteremia 06/11/2015  . Gonalgia 06/11/2015  . Herpes zona 06/11/2015  . Female genuine stress incontinence 06/11/2015  . Chronic constipation 09/02/2014  . Lipoma of back-left 07/18/2012   Willa Rough DPT, ATC Willa Rough 01/06/2018, 4:45 PM  [place] MAIN [facility] SERVICES [address] [place], [place], [address] Phone: [phone]   Fax:  [phone]  Name: Barbara Gates MRN: [id number] Date of Birth: 11-23-1950

## 2018-01-03 NOTE — Patient Instructions (Signed)
1) when seated on the toilet, lean slightly forward and sit hips back far enough in the toilet where you can feel the edge of the toilet. Breathe in and feel the sit bones press into the toilet laterally, breathe out and gently contract the pelvic floor muscles (kegel). Go back and forth between these deep breaths and contractions and each time let the pelvic floor relax farther down. Give your self up to 10 minutes to have a BM before you give up and try to squeeze.

## 2018-01-08 ENCOUNTER — Other Ambulatory Visit: Payer: BLUE CROSS/BLUE SHIELD

## 2018-01-09 ENCOUNTER — Ambulatory Visit
Admission: RE | Admit: 2018-01-09 | Discharge: 2018-01-09 | Disposition: A | Discharge status: Home / Self Care | Payer: Medicare Other | Source: Ambulatory Visit | Attending: Physician Assistant | Admitting: Physician Assistant

## 2018-01-09 DIAGNOSIS — L820 Inflamed seborrheic keratosis: Secondary | ICD-10-CM | POA: Diagnosis not present

## 2018-01-09 DIAGNOSIS — D1800 Hemangioma unspecified site: Secondary | ICD-10-CM | POA: Diagnosis not present

## 2018-01-09 DIAGNOSIS — Z1231 Encounter for screening mammogram for malignant neoplasm of breast: Secondary | ICD-10-CM | POA: Insufficient documentation

## 2018-01-09 DIAGNOSIS — Z1239 Encounter for other screening for malignant neoplasm of breast: Secondary | ICD-10-CM

## 2018-01-09 DIAGNOSIS — Z1283 Encounter for screening for malignant neoplasm of skin: Secondary | ICD-10-CM | POA: Diagnosis not present

## 2018-01-09 DIAGNOSIS — Z803 Family history of malignant neoplasm of breast: Secondary | ICD-10-CM

## 2018-01-09 DIAGNOSIS — L819 Disorder of pigmentation, unspecified: Secondary | ICD-10-CM | POA: Diagnosis not present

## 2018-01-09 DIAGNOSIS — D229 Melanocytic nevi, unspecified: Secondary | ICD-10-CM | POA: Diagnosis not present

## 2018-01-09 DIAGNOSIS — L821 Other seborrheic keratosis: Secondary | ICD-10-CM | POA: Diagnosis not present

## 2018-01-09 DIAGNOSIS — L814 Other melanin hyperpigmentation: Secondary | ICD-10-CM | POA: Diagnosis not present

## 2018-01-10 ENCOUNTER — Ambulatory Visit: Payer: Medicare Other

## 2018-01-10 ENCOUNTER — Telehealth: Payer: Self-pay

## 2018-01-10 DIAGNOSIS — M545 Low back pain: Secondary | ICD-10-CM

## 2018-01-10 DIAGNOSIS — G8929 Other chronic pain: Secondary | ICD-10-CM | POA: Diagnosis not present

## 2018-01-10 DIAGNOSIS — M533 Sacrococcygeal disorders, not elsewhere classified: Secondary | ICD-10-CM | POA: Diagnosis not present

## 2018-01-10 DIAGNOSIS — R278 Other lack of coordination: Secondary | ICD-10-CM

## 2018-01-10 NOTE — Therapy (Signed)
[place] MAIN [facility] SERVICES [address] [place], [place], [address] Phone: [phone]   Fax:  [phone]  Physical Therapy Treatment  Patient Details  Name: Barbara Gates MRN: [id number] Date of Birth: 02/01/1951 Referring Provider: Josephina Gip PA   Encounter Date: 01/10/2018  PT End of Session - 01/10/18 2104    Visit Number  5    Number of Visits  12    Date for PT Re-Evaluation  02/13/18    Authorization Type  medicare    Authorization Time Period  02/13/2018    Authorization - Visit Number  1    Authorization - Number of Visits  10    PT Start Time  1540    PT Stop Time  1648    PT Time Calculation (min)  68 min    Activity Tolerance  Patient tolerated treatment well    Behavior During Therapy  [facility] for tasks assessed/performed       Past Medical History:  Diagnosis Date  . Hypercalcemia   . Hyperlipidemia    Has history of this  . Osteoporosis     Past Surgical History:  Procedure Laterality Date  . ABDOMINAL HYSTERECTOMY  1994   Total including ovarie. Due to endometriosis  . BREAST BIOPSY Left 11/08/2012   Dr. Bary Castilla, neg  . Surgical Lipoma Removal; 12/13/2012  12/13/2012    There were no vitals filed for this visit.   Pelvic Floor Physical Therapy Treatment Note  SCREENING  Changes in medications, allergies, or medical history?: backed off a little with magnesium today (750 MG)Taking multivitamin every-other day. Alternating days with colace and stool softener. Made these changes today.Has had mildly firmer stool and having BM's every day.     SUBJECTIVE  Patient reports: She has been having BM's daily with slightly more solid stool and less diarrhea, has been slowly backing off stool softener ans colace.  Pain Update:   Location of pain: R hip and low back, and knees Current pain: 0/10  Max pain: 3/10 Least pain: 0/10 Nature of pain:sharp  Patient Goals: Bee able to decrease  constipation and be regular without requiring medications.   OBJECTIVE  Changes in:  Pelvic floor: Patient was able to perform a coordinated contraction, relaxation, and bulge with minimal cueing. When patient used her old "pattern" for having a BM we were able to see with the biofeedback that she was getting contraction rather than relaxation but patient was able to correct with imagery and cueing.  INTERVENTIONS THIS SESSION: Manual: Pt. educated on and practiced colonic massage for improved ability to maintain regular bowel movements as she starts tapering off of her stool softeners and other medications for constipation. Self-care: educated patient on how to use bowel retraining to encourage her BM's to come earlier in the day so they do not interfere with her schedule and  So she  Will have an easier time passing it due to less anxiety about not being at home when she needs to go. NM Re--ed: used biofeedback with verbal cueing and visualization techniques/positioning to improve pelvic floor coordination and relaxation for less constipation and straining.  Total time: 53                          PT Education - 01/10/18 2103    Education provided  Yes    Education Details  see Pt. instructions and interventions this session    Person(s) Educated  Patient    Methods  Explanation;Handout;Tactile cues;Verbal cues    Comprehension  Verbalized understanding;Returned demonstration;Verbal cues required;Tactile cues required          PT Long Term Goals - 12/14/17 2054      PT LONG TERM GOAL #1   Title  Pt will be able to eliminate bowel movements of consistency of Type 4-5 across 75% of time instead of Type 6-7 in order to minimize straining    Time  10    Period  Weeks    Status  On-going      PT LONG TERM GOAL #2   Title  Pt will able to decrease COREFO score from 20% to < 15% in order to improve GI function    Time  12    Period  Weeks    Status  On-going       PT LONG TERM GOAL #3   Title  Pt will report decreased LBP / R hip pain with standing from 10 min ( VAS level 5/10) to >30 min ( < 2/10 ) in order to particiapte in community events  and to volunteer at [employer]    Time  8    Period  Weeks    Status  On-going      PT LONG TERM GOAL #4   Title  Pt will dem odecreased abdominal separation from 2 fingers width along linea alba to < 1 fingers width in order to promote motility and postural stability     Time  4    Period  Weeks    Status  Achieved      PT LONG TERM GOAL #5   Title  Pt will demo no pelvic obliquities and report no increased pain at L rib that presents prior to elimination of bowel movements in order to improve QOL     Time  2    Period  Weeks    Status  Achieved      PT LONG TERM GOAL #6   Title  Pt will increase her hip abduction strength from 3/5 B to > 4/5  in order to climb stairs at home with minimize risk for injuries     Time  10    Period  Weeks    Status  On-going      PT LONG TERM GOAL #7   Title  Pt will report being able to not leak prior to making it to the bathroom in order to travel    Time  12    Period  Weeks    Status  On-going      PT LONG TERM GOAL #8   Title  Pt will demo no pelvic floor tightness across 2 visits in order promote pelvic floor ROM for better function    Time  6    Period  Weeks    Status  New    Target Date  01/25/18            Plan - 01/10/18 2107    Clinical Impression Statement  Pt. was able to demonstrate coordinated pelvic floor contraction, relaxation, and bulge in hook-lying as well as in more functional seated position today with biofeedback. She also demonstrated understanding of all education provided. Continue per POC     Clinical Presentation  Stable    Clinical Decision Making  High    Rehab Potential  Good    PT Frequency  1x / week    PT Duration  12 weeks    PT Treatment/Interventions  Neuromuscular re-education;Therapeutic  activities;Therapeutic exercise;Moist Heat;Patient/family education;Manual techniques;Functional mobility training;Stair training;Biofeedback    PT Next Visit Plan  perform pelvic tilts on ball with breathing, child's pose, TP release?     Consulted and Agree with Plan of Care  Patient       Patient will benefit from skilled therapeutic intervention in order to improve the following deficits and impairments:  Decreased safety awareness, Decreased coordination, Decreased knowledge of use of DME, Decreased strength, Increased fascial restricitons, Decreased scar mobility, Decreased range of motion, Decreased cognition, Decreased mobility, Decreased endurance, Decreased balance, Increased muscle spasms, Postural dysfunction, Pain, Improper body mechanics  Visit Diagnosis: Other lack of coordination  Sacrococcygeal disorders, not elsewhere classified  Chronic right-sided low back pain, with sciatica presence unspecified     Problem List Patient Active Problem List   Diagnosis Date Noted  . Hypercalcemia 12/30/2015  . Memory impairment of gradual onset 12/30/2015  . Abnormal blood chemistry 06/11/2015  . Adult idiopathic generalized osteoporosis 06/11/2015  . Colon polyp 06/11/2015  . Closed fracture of proximal end of radius 06/11/2015  . Auditory impairment 06/11/2015  . Blood in the urine 06/11/2015  . Hypercholesteremia 06/11/2015  . Gonalgia 06/11/2015  . Herpes zona 06/11/2015  . Female genuine stress incontinence 06/11/2015  . Chronic constipation 09/02/2014  . Lipoma of back-left 07/18/2012   Willa Rough DPT, ATC Willa Rough 01/10/2018, 9:21 PM  [place] MAIN [facility] SERVICES [address] [place], [place], [address] Phone: [phone]   Fax:  [phone]  Name: Barbara Gates MRN: [id number] Date of Birth: 06/16/1951

## 2018-01-10 NOTE — Telephone Encounter (Signed)
-----   Message from Mar Daring, PA-C sent at 01/10/2018 11:22 AM EST ----- Normal mammogram. Repeat screening in one year.

## 2018-01-10 NOTE — Telephone Encounter (Signed)
Patient advised as below.  

## 2018-01-10 NOTE — Patient Instructions (Signed)
   The "I Love You" massage for your colon  Start by resting or lying quietly. 1. Using your fingertips, you apply light pressure in a stroking motion. 2. Start with your hands on the left hand side of your abdomen, below the rib cage, and stroke or make small circles down towards your left hip. This is the "I" of the "I Love You" massage. 3. Next, you are going to make the strokes in an upside down "L" shape. Run your fingertips from the right side of your upper abdomen, across under your ribs, and down the left side. 4. Now you are going to run through the whole path. This is the "U". Start on the bottom right of your abdomen. Stroke up the right side, across under the rib cage, and down the left side.   5. Finally, Using your fingertips, you apply light pressure in small circles  through the whole "U" path to "wake up" the smooth muscles of the intestines and get things moving.    Up the right, across under the rib cage, down the left and inwards, moving in a clockwise motion (if you are looking down upon your own abdomen)  Essentially you are massaging along the path of your large intestine. Our colon starts roughly in the bottom right of our abdomen, travels up the right hand side, turns and runs across below our rib cage, and then down the left side and in towards the pubic bone. When I teach this massage for people to do at home I have them start with 10 minutes. However, anecdotally, many people tell me 15-20 minutes really gets things going! After about 5 minutes of this massage my insides start gurgling and making noises. 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 A big problem is constipation resulting from either medication side effects, post surgical changes, or the fact that in general people in the hospital don't move as much (and exercise such as walking also helps regulate our digestive system). One of the first "exercises" I would teach them is how to do  the "I Love You" abdominal massage. Time and time again I have people come back to me and say that massaging their abdominal tissue helped their digestive issues. Give it a try today!  *Adapted from article written by Gwenlyn Perking, PT, DPT   Bowel training:  1) Drink hot drink (eat optional) 2) go for a short walk 3) do colon ( I love you) massage 4) Go to the bathroom, using footstool, take deep breaths, relaxing the pelvic floor and encouraging lengthening. Give yourself up to 10 minutes before giving up on the

## 2018-01-16 ENCOUNTER — Encounter: Payer: BLUE CROSS/BLUE SHIELD | Admitting: Physical Therapy

## 2018-01-17 ENCOUNTER — Ambulatory Visit: Payer: Medicare Other

## 2018-01-17 DIAGNOSIS — R278 Other lack of coordination: Secondary | ICD-10-CM | POA: Diagnosis not present

## 2018-01-17 DIAGNOSIS — M545 Low back pain: Secondary | ICD-10-CM | POA: Diagnosis not present

## 2018-01-17 DIAGNOSIS — G8929 Other chronic pain: Secondary | ICD-10-CM

## 2018-01-17 DIAGNOSIS — M533 Sacrococcygeal disorders, not elsewhere classified: Secondary | ICD-10-CM | POA: Diagnosis not present

## 2018-01-17 NOTE — Therapy (Signed)
[place] MAIN [facility] SERVICES [address] [place], [place], [address] Phone: [phone]   Fax:  [phone]  Physical Therapy Treatment  Patient Details  Name: Barbara Gates MRN: [id number] Date of Birth: 11/18/1951 Referring Provider: Josephina Gip PA   Encounter Date: 01/17/2018  PT End of Session - 01/18/18 1603    Visit Number  6    Number of Visits  12    Date for PT Re-Evaluation  02/13/18    Authorization Type  medicare    Authorization Time Period  02/13/2018    Authorization - Visit Number  6    Authorization - Number of Visits  10    PT Start Time  1630    PT Stop Time  1735    PT Time Calculation (min)  65 min    Activity Tolerance  Patient tolerated treatment well    Behavior During Therapy  [facility] for tasks assessed/performed       Past Medical History:  Diagnosis Date  . Hypercalcemia   . Hyperlipidemia    Has history of this  . Osteoporosis     Past Surgical History:  Procedure Laterality Date  . ABDOMINAL HYSTERECTOMY  1994   Total including ovarie. Due to endometriosis  . BREAST BIOPSY Left 11/08/2012   Dr. Bary Castilla, neg  . Surgical Lipoma Removal; 12/13/2012  12/13/2012    There were no vitals filed for this visit.    Pelvic Floor Physical Therapy Treatment Note  SCREENING  Changes in medications, allergies, or medical history?: no     SUBJECTIVE  Patient reports: Still has back pain when standing still too long, leakage with urgency still present. Has had success with the bowel retraining. Has increased how "hot" her coffee is in the morning. She is still going daily but feels that she is having to strain a little more still on days without the colace.   Pain update:  Location of pain: LB and R hip Current pain:  0/10  Max pain:  3/10 Least pain:  0/10 Nature of pain: sharp,  Patient Goals: Decrease back and hip pain, incontinence and constipation.   OBJECTIVE  INTERVENTIONS  THIS SESSION: Self-care: Educated on urge-supression technique to decrease UI. Educated on using a heating pack to help decrease back pain and tension after being on her feet for extended periods until we can determine root cause of pain.  Therex: Educated on and practiced pelvic tilts on ball to improve spine mobility and encourage TA recruitment and improve proprioception of PFM, patient had increased tension of the spinal extensors and had discomfort from this through the full ROM, confounding and information about nerve involvement. Educated on using a physioball as a computer chair to gently strengthen core musculature. Educated on use of a physioball to allow her to do pain-free squats for LE strengthening.   NM re-ed: Educated on using kegel with sit-to-stand and stand-to-sit to improve timing of PFM to decrease UI. Educated on and practiced TA recruitment in quadruped to improve pelvic support and allow relaxation of the PFM as well as maximal force generation with combined use of pelvic brace.  Total time:62 min.                         PT Education - 01/18/18 1602    Education provided  Yes    Education Details  See Pt. Instructions and Interventions this session.    Person(s) Educated  Patient  Methods  Explanation;Demonstration;Tactile cues;Verbal cues;Handout    Comprehension  Verbalized understanding;Returned demonstration;Verbal cues required;Tactile cues required          PT Long Term Goals - 12/14/17 2054      PT LONG TERM GOAL #1   Title  Pt will be able to eliminate bowel movements of consistency of Type 4-5 across 75% of time instead of Type 6-7 in order to minimize straining    Time  10    Period  Weeks    Status  On-going      PT LONG TERM GOAL #2   Title  Pt will able to decrease COREFO score from 20% to < 15% in order to improve GI function    Time  12    Period  Weeks    Status  On-going      PT LONG TERM GOAL #3   Title  Pt will  report decreased LBP / R hip pain with standing from 10 min ( VAS level 5/10) to >30 min ( < 2/10 ) in order to particiapte in community events  and to volunteer at [employer]    Time  8    Period  Weeks    Status  On-going      PT LONG TERM GOAL #4   Title  Pt will dem odecreased abdominal separation from 2 fingers width along linea alba to < 1 fingers width in order to promote motility and postural stability     Time  4    Period  Weeks    Status  Achieved      PT LONG TERM GOAL #5   Title  Pt will demo no pelvic obliquities and report no increased pain at L rib that presents prior to elimination of bowel movements in order to improve QOL     Time  2    Period  Weeks    Status  Achieved      PT LONG TERM GOAL #6   Title  Pt will increase her hip abduction strength from 3/5 B to > 4/5  in order to climb stairs at home with minimize risk for injuries     Time  10    Period  Weeks    Status  On-going      PT LONG TERM GOAL #7   Title  Pt will report being able to not leak prior to making it to the bathroom in order to travel    Time  12    Period  Weeks    Status  On-going      PT LONG TERM GOAL #8   Title  Pt will demo no pelvic floor tightness across 2 visits in order promote pelvic floor ROM for better function    Time  6    Period  Weeks    Status  New    Target Date  01/25/18            Plan - 01/18/18 1604    Clinical Impression Statement  Patient is improving well with her constipation issues, gaining control and being able to have successful BM's with less medication so today's session focused on bladder issues and pain. Patient responded well to all interventions. Continue per POC.    Clinical Presentation  Stable    Clinical Decision Making  High    Rehab Potential  Good    PT Frequency  1x / week    PT Duration  12 weeks  PT Treatment/Interventions  Neuromuscular re-education;Therapeutic activities;Therapeutic exercise;Moist Heat;Patient/family  education;Manual techniques;Functional mobility training;Stair training;Biofeedback    PT Next Visit Plan  re-assess PFM and ST goals, child's pose, TP release? assess adductors, progress TA in quadruped and squat with ball    PT Home Exercise Plan  TA in quadruped, urge suppression, sit-to-stand,     Consulted and Agree with Plan of Care  Patient       Patient will benefit from skilled therapeutic intervention in order to improve the following deficits and impairments:  Decreased safety awareness, Decreased coordination, Decreased knowledge of use of DME, Decreased strength, Increased fascial restricitons, Decreased scar mobility, Decreased range of motion, Decreased cognition, Decreased mobility, Decreased endurance, Decreased balance, Increased muscle spasms, Postural dysfunction, Pain, Improper body mechanics  Visit Diagnosis: Other lack of coordination  Sacrococcygeal disorders, not elsewhere classified  Chronic right-sided low back pain, with sciatica presence unspecified     Problem List Patient Active Problem List   Diagnosis Date Noted  . Hypercalcemia 12/30/2015  . Memory impairment of gradual onset 12/30/2015  . Abnormal blood chemistry 06/11/2015  . Adult idiopathic generalized osteoporosis 06/11/2015  . Colon polyp 06/11/2015  . Closed fracture of proximal end of radius 06/11/2015  . Auditory impairment 06/11/2015  . Blood in the urine 06/11/2015  . Hypercholesteremia 06/11/2015  . Gonalgia 06/11/2015  . Herpes zona 06/11/2015  . Female genuine stress incontinence 06/11/2015  . Chronic constipation 09/02/2014  . Lipoma of back-left 07/18/2012   Willa Rough DPT, ATC Willa Rough 01/18/2018, 4:11 PM  [place] MAIN [facility] SERVICES [address] [place], [place], [address] Phone: [phone]   Fax:  [phone]  Name: Barbara Gates MRN: [id number] Date of Birth: December 26, 1950

## 2018-01-17 NOTE — Patient Instructions (Addendum)
Urge supression technique:  1) Take a deep breath to convince yourself that you are in control and calm the nervous system.  2) Do 5 "quick-flick" kegels (pelvic floor muscle contractions) and re-assess the urge. Repeat another set if urge is still present. 3) Once the urge has decreased, start walking calmly to the the bathroom. Stop and repeat steps 1 and 2 as many times as needed until you can successfully get to the toilet. 4) Only once seated, take a deep breath and allow the pelvic floor muscles to relax and allow for the urine to flow.    Do not be discouraged if you are not successful the first couple times, this is normal and it will take practice but remember that YOU are in control. Start by practicing this at home where you do not have to worry as much if there were to be an accident. Allowing yourself to get rushed or nervous puts the bladder back in control and will not allow the technique to work.   -Try doing 5 quick-flicks before you stand after having been seated 30 min. Or more.   Stabilization: Sit to Stand Transfer, Pelvic Floor Contraction    Sit, feet flat, contract pelvic floor as if stopping urination. Bend forward at hips, stand. Do this every time!  Copyright  VHI. All rights reserved.       (hold off on this exercise)  Diona Foley is 75cm /30 inches     Breathe in, let belly relax down toward the floor and then breathe out, pulling the lower belly in toward the backbone.   Repeat this _10x3__ times _1-2__ times per day

## 2018-01-24 ENCOUNTER — Ambulatory Visit: Payer: Medicare Other | Attending: Gastroenterology

## 2018-01-24 DIAGNOSIS — M545 Low back pain: Secondary | ICD-10-CM | POA: Diagnosis not present

## 2018-01-24 DIAGNOSIS — M533 Sacrococcygeal disorders, not elsewhere classified: Secondary | ICD-10-CM | POA: Diagnosis not present

## 2018-01-24 DIAGNOSIS — G8929 Other chronic pain: Secondary | ICD-10-CM | POA: Diagnosis not present

## 2018-01-24 DIAGNOSIS — R278 Other lack of coordination: Secondary | ICD-10-CM | POA: Diagnosis not present

## 2018-01-24 NOTE — Patient Instructions (Addendum)
3-Way Wall Stretches for Pelvic Floor Lengthening   Bring bottom close to the wall and gently press straighten knees to feel a stretch down the back of you thighs. Hold while taking 5 deep belly breaths and feeling the pelvic floor relax and lower on each inhale.    Let your legs fall to the side to feel a stretch on the inside of your thighs. If the stretch is too intense you can use a pillow to take some of the weight off by wedging it on the outside of your hips. Hold while taking 5 deep belly breaths and feeling the pelvic floor relax and lower on each inhale.    Slide feet down the wall and move hips slightly away from the wall and then let your knees fall to the sides so you feel a stretch on the inside of the thighs near your groin. Hold while taking 5 deep belly breaths and feeling the pelvic floor relax and lower on each inhale.    *use a towel under the arch of your low back to help decrease the discomfort in the low back to allow relaxation with the stretches. *Perform each stretch in sequence 3 times, once a day   *If you go more than one day without having a BM and you can feel the pressure in the bowels, use your thumb to help "splint" the colon so you can pass the BM without straining.

## 2018-01-24 NOTE — Therapy (Signed)
[place] MAIN [facility] SERVICES [address] [place], [place], [address] Phone: [phone]   Fax:  [phone]  Physical Therapy Treatment  Patient Details  Name: Barbara Gates MRN: [id number] Date of Birth: 1951/01/21 Referring Provider: Josephina Gip PA   Encounter Date: 01/24/2018  PT End of Session - 01/25/18 1750    Visit Number  7    Number of Visits  12    Date for PT Re-Evaluation  02/13/18    Authorization Type  medicare    Authorization Time Period  02/13/2018    Authorization - Visit Number  7    Authorization - Number of Visits  10    PT Start Time  6433    PT Stop Time  1640    PT Time Calculation (min)  65 min    Activity Tolerance  Patient tolerated treatment well    Behavior During Therapy  [facility] for tasks assessed/performed       Past Medical History:  Diagnosis Date  . Hypercalcemia   . Hyperlipidemia    Has history of this  . Osteoporosis     Past Surgical History:  Procedure Laterality Date  . ABDOMINAL HYSTERECTOMY  1994   Total including ovarie. Due to endometriosis  . BREAST BIOPSY Left 11/08/2012   Dr. Bary Castilla, neg  . Surgical Lipoma Removal; 12/13/2012  12/13/2012    There were no vitals filed for this visit.    Pelvic Floor Physical Therapy Treatment Note  SCREENING  Changes in medications, allergies, or medical history?:    SUBJECTIVE  Patient reports: She had a fall today, she is "bruised but not not broken". On Friday 3/1 night she did not take any pills and did not have a BM until Tuesday 3/5. Has been able to successfully implement urge suppression to alleviate UI over past week.  Pain update: Patient stated that "book sale season is not a good time to ask about pain" She knows that she is over-doing provoking activities but has decided that it is worth the pain for the short time period.  Patient Goals: Decrease back and hip pain, incontinence and  constipation.   OBJECTIVE  Changes in: Posture/Observations:  Hyperkyphosis and forward head.   Range of Motion/Flexibilty:  Very little abduction ROM without TTP through adductors. Tightness of HS creates LBP. Able to support lumbar spine to decrease pain. :  Pelvic floor: Tenderness Greatest at coccygeus B, also TTP through IC posteriorly and PR anteriorly (PR also produces urge to urinate). Strength demnstrated was 4/5 before TP release and 4+/5 after TP release.     INTERVENTIONS THIS SESSION: Self-care: Discussed using splinting technique to allow for BM without straining and decrease constipation. Therex: Educated on 3-way wall stretch to decrease tension of muscles acting on and within the Pelvic floor to allow for improved lengthening for less constipation and improved recruitment for decreased SUI as well as decreased tension on LB for decreased pain. Manual: Performed TP release to multiple Internal TP's B to allow for decreased constipation, improved proprioception, and decreased SUI.  Total time: 68 min.                         PT Education - 01/25/18 1749    Education provided  Yes    Education Details  See Pt. Instructions and interventions this session    Person(s) Educated  Patient    Methods  Explanation;Demonstration;Verbal cues;Handout    Comprehension  Verbalized understanding;Returned demonstration;Verbal cues required          PT Long Term Goals - 12/14/17 2054      PT LONG TERM GOAL #1   Title  Pt will be able to eliminate bowel movements of consistency of Type 4-5 across 75% of time instead of Type 6-7 in order to minimize straining    Time  10    Period  Weeks    Status  On-going      PT LONG TERM GOAL #2   Title  Pt will able to decrease COREFO score from 20% to < 15% in order to improve GI function    Time  12    Period  Weeks    Status  On-going      PT LONG TERM GOAL #3   Title  Pt will report decreased LBP / R hip  pain with standing from 10 min ( VAS level 5/10) to >30 min ( < 2/10 ) in order to particiapte in community events  and to volunteer at [employer]    Time  8    Period  Weeks    Status  On-going      PT LONG TERM GOAL #4   Title  Pt will dem odecreased abdominal separation from 2 fingers width along linea alba to < 1 fingers width in order to promote motility and postural stability     Time  4    Period  Weeks    Status  Achieved      PT LONG TERM GOAL #5   Title  Pt will demo no pelvic obliquities and report no increased pain at L rib that presents prior to elimination of bowel movements in order to improve QOL     Time  2    Period  Weeks    Status  Achieved      PT LONG TERM GOAL #6   Title  Pt will increase her hip abduction strength from 3/5 B to > 4/5  in order to climb stairs at home with minimize risk for injuries     Time  10    Period  Weeks    Status  On-going      PT LONG TERM GOAL #7   Title  Pt will report being able to not leak prior to making it to the bathroom in order to travel    Time  12    Period  Weeks    Status  On-going      PT LONG TERM GOAL #8   Title  Pt will demo no pelvic floor tightness across 2 visits in order promote pelvic floor ROM for better function    Time  6    Period  Weeks    Status  New    Target Date  01/25/18            Plan - 01/25/18 0955    Clinical Impression Statement  Patient has been able to use urge suppression technique to alleviate SUI, She is concerned that she went 5 days without a BM after missing one day of medication but was receptive to education on splinting to allow for bowel evacuation rather than waiting due to redundant colon. Patient will benefit form more focus on core strengthening to decrease LBP. Continue per POC.     Clinical Presentation  Stable    Clinical Decision Making  High    Rehab Potential  Good    PT  Frequency  1x / week    PT Duration  12 weeks    PT Treatment/Interventions   Neuromuscular re-education;Therapeutic activities;Therapeutic exercise;Moist Heat;Patient/family education;Manual techniques;Functional mobility training;Stair training;Biofeedback    PT Next Visit Plan  TA in quadruped and squat with ball, Bodymechanics with moving boxes/crates/rotation. tall-kneeling     PT Home Exercise Plan  TA in quadruped, urge suppression, sit-to-stand, 3-way wall stretch, squatty potty.    Consulted and Agree with Plan of Care  Patient       Patient will benefit from skilled therapeutic intervention in order to improve the following deficits and impairments:  Decreased safety awareness, Decreased coordination, Decreased knowledge of use of DME, Decreased strength, Increased fascial restricitons, Decreased scar mobility, Decreased range of motion, Decreased cognition, Decreased mobility, Decreased endurance, Decreased balance, Increased muscle spasms, Postural dysfunction, Pain, Improper body mechanics  Visit Diagnosis: Other lack of coordination  Sacrococcygeal disorders, not elsewhere classified  Chronic right-sided low back pain, with sciatica presence unspecified     Problem List Patient Active Problem List   Diagnosis Date Noted  . Hypercalcemia 12/30/2015  . Memory impairment of gradual onset 12/30/2015  . Abnormal blood chemistry 06/11/2015  . Adult idiopathic generalized osteoporosis 06/11/2015  . Colon polyp 06/11/2015  . Closed fracture of proximal end of radius 06/11/2015  . Auditory impairment 06/11/2015  . Blood in the urine 06/11/2015  . Hypercholesteremia 06/11/2015  . Gonalgia 06/11/2015  . Herpes zona 06/11/2015  . Female genuine stress incontinence 06/11/2015  . Chronic constipation 09/02/2014  . Lipoma of back-left 07/18/2012   Willa Rough DPT, ATC Willa Rough 01/25/2018, 5:53 PM  [place] MAIN [facility] SERVICES [address] [place], [place], [address] Phone: [phone]   Fax:   [phone]  Name: Marija Calamari MRN: [id number] Date of Birth: February 03, 1951

## 2018-01-30 ENCOUNTER — Encounter: Payer: BLUE CROSS/BLUE SHIELD | Admitting: Physical Therapy

## 2018-02-01 ENCOUNTER — Ambulatory Visit: Payer: Medicare Other

## 2018-02-07 ENCOUNTER — Ambulatory Visit: Payer: Medicare Other

## 2018-02-07 DIAGNOSIS — G8929 Other chronic pain: Secondary | ICD-10-CM

## 2018-02-07 DIAGNOSIS — R278 Other lack of coordination: Secondary | ICD-10-CM | POA: Diagnosis not present

## 2018-02-07 DIAGNOSIS — M545 Low back pain: Secondary | ICD-10-CM

## 2018-02-07 DIAGNOSIS — M533 Sacrococcygeal disorders, not elsewhere classified: Secondary | ICD-10-CM | POA: Diagnosis not present

## 2018-02-09 NOTE — Therapy (Signed)
[place] MAIN [facility] SERVICES [address] [place], [place], [address] Phone: [phone]   Fax:  [phone]  Physical Therapy Treatment  Patient Details  Name: Barbara Gates MRN: [id number] Date of Birth: 1951/09/12 Referring Provider: Josephina Gip PA   Encounter Date: 02/07/2018  PT End of Session - 02/09/18 2047    Visit Number  8    Number of Visits  12    Date for PT Re-Evaluation  02/13/18    Authorization Type  medicare    Authorization Time Period  02/13/2018    Authorization - Visit Number  8    Authorization - Number of Visits  10    PT Start Time  1324    PT Stop Time  1640    PT Time Calculation (min)  65 min    Activity Tolerance  Patient tolerated treatment well    Behavior During Therapy  [facility] for tasks assessed/performed       Past Medical History:  Diagnosis Date  . Hypercalcemia   . Hyperlipidemia    Has history of this  . Osteoporosis     Past Surgical History:  Procedure Laterality Date  . ABDOMINAL HYSTERECTOMY  1994   Total including ovarie. Due to endometriosis  . BREAST BIOPSY Left 11/08/2012   Dr. Bary Castilla, neg  . Surgical Lipoma Removal; 12/13/2012  12/13/2012    There were no vitals filed for this visit.    Pelvic Floor Physical Therapy Treatment Note  SCREENING  Changes in medications, allergies, or medical history?:no   SUBJECTIVE  Patient reports: She is still very stressed with her volunteering/work she has noticed that she goes 2-3 days without a BM and when she tries "splinting" there is nothing in the rectum to push on, whenever there is something, she is able to pass it without too much difficulty.  Pain update: She has been having pain near her R inguinal fold recently. Pain at about a 5/10 walking in and 1/10 as she left today.  Patient Goals: Decrease back and hip pain, incontinence and constipation.    OBJECTIVE   INTERVENTIONS THIS SESSION: Manual:  Performed TP release to L obliques, iliopsoas, and QL to decrease tension on the nerves as they course into the pelvis to decrease neural sensitivity and spasm allowing for more relazation and decreased constipation. Theract: educated patient on how the stim works to decrease constipation by increasing motility and how to use it.  Total time: 65 min.         No data recorded               PT Education - 02/09/18 2045    Education provided  Yes    Education Details  Educated on how the nerves andmuscles in the thoracic and lumbar spine affect the pelvic floor and who it was important to treat the spasms in these muscles as well as how to use her tens unit to stimulate the transverse colon.    Person(s) Educated  Patient    Methods  Explanation    Comprehension  Verbalized understanding          PT Long Term Goals - 12/14/17 2054      PT LONG TERM GOAL #1   Title  Pt will be able to eliminate bowel movements of consistency of Type 4-5 across 75% of time instead of Type 6-7 in order to minimize straining    Time  10    Period  Weeks  Status  On-going      PT LONG TERM GOAL #2   Title  Pt will able to decrease COREFO score from 20% to < 15% in order to improve GI function    Time  12    Period  Weeks    Status  On-going      PT LONG TERM GOAL #3   Title  Pt will report decreased LBP / R hip pain with standing from 10 min ( VAS level 5/10) to >30 min ( < 2/10 ) in order to particiapte in community events  and to volunteer at [employer]    Time  8    Period  Weeks    Status  On-going      PT LONG TERM GOAL #4   Title  Pt will dem odecreased abdominal separation from 2 fingers width along linea alba to < 1 fingers width in order to promote motility and postural stability     Time  4    Period  Weeks    Status  Achieved      PT LONG TERM GOAL #5   Title  Pt will demo no pelvic obliquities and report no increased pain at L rib that presents prior to  elimination of bowel movements in order to improve QOL     Time  2    Period  Weeks    Status  Achieved      PT LONG TERM GOAL #6   Title  Pt will increase her hip abduction strength from 3/5 B to > 4/5  in order to climb stairs at home with minimize risk for injuries     Time  10    Period  Weeks    Status  On-going      PT LONG TERM GOAL #7   Title  Pt will report being able to not leak prior to making it to the bathroom in order to travel    Time  12    Period  Weeks    Status  On-going      PT LONG TERM GOAL #8   Title  Pt will demo no pelvic floor tightness across 2 visits in order promote pelvic floor ROM for better function    Time  6    Period  Weeks    Status  New    Target Date  01/25/18            Plan - 02/09/18 2048    Clinical Impression Statement  Patient responded well to all interventions today, demonstrating a decrease in pain and stiffness        Patient will benefit from skilled therapeutic intervention in order to improve the following deficits and impairments:     Visit Diagnosis: Other lack of coordination  Sacrococcygeal disorders, not elsewhere classified  Chronic right-sided low back pain, with sciatica presence unspecified     Problem List Patient Active Problem List   Diagnosis Date Noted  . Hypercalcemia 12/30/2015  . Memory impairment of gradual onset 12/30/2015  . Abnormal blood chemistry 06/11/2015  . Adult idiopathic generalized osteoporosis 06/11/2015  . Colon polyp 06/11/2015  . Closed fracture of proximal end of radius 06/11/2015  . Auditory impairment 06/11/2015  . Blood in the urine 06/11/2015  . Hypercholesteremia 06/11/2015  . Gonalgia 06/11/2015  . Herpes zona 06/11/2015  . Female genuine stress incontinence 06/11/2015  . Chronic constipation 09/02/2014  . Lipoma of back-left 07/18/2012   Willa Rough  DPT, ATC Willa Rough 02/09/2018, 8:58 PM  [place] MAIN [facility]  SERVICES [address] [place], [place], [address] Phone: [phone]   Fax:  [phone]  Name: Barbara Gates MRN: [id number] Date of Birth: 1951-08-24

## 2018-02-13 ENCOUNTER — Ambulatory Visit: Payer: Medicare Other

## 2018-02-14 ENCOUNTER — Ambulatory Visit: Payer: Medicare Other

## 2018-02-14 DIAGNOSIS — R278 Other lack of coordination: Secondary | ICD-10-CM | POA: Diagnosis not present

## 2018-02-14 DIAGNOSIS — M533 Sacrococcygeal disorders, not elsewhere classified: Secondary | ICD-10-CM

## 2018-02-14 DIAGNOSIS — M545 Low back pain: Secondary | ICD-10-CM | POA: Diagnosis not present

## 2018-02-14 DIAGNOSIS — G8929 Other chronic pain: Secondary | ICD-10-CM

## 2018-02-14 NOTE — Therapy (Signed)
[place] MAIN [facility] SERVICES [address] [place], [place], [address] Phone: [phone]   Fax:  [phone]  Physical Therapy Treatment  Patient Details  Name: Barbara Gates MRN: [id number] Date of Birth: 23-May-1951 Referring Provider: Josephina Gip PA   Encounter Date: 02/14/2018  PT End of Session - 02/15/18 1917    Visit Number  9    Number of Visits  12    Date for PT Re-Evaluation  02/13/18    Authorization Type  medicare    Authorization Time Period  02/13/2018    Authorization - Visit Number  9    Authorization - Number of Visits  10    PT Start Time  7425    PT Stop Time  1730    PT Time Calculation (min)  55 min    Activity Tolerance  Patient tolerated treatment well    Behavior During Therapy  [facility] for tasks assessed/performed       Past Medical History:  Diagnosis Date  . Hypercalcemia   . Hyperlipidemia    Has history of this  . Osteoporosis     Past Surgical History:  Procedure Laterality Date  . ABDOMINAL HYSTERECTOMY  1994   Total including ovarie. Due to endometriosis  . BREAST BIOPSY Left 11/08/2012   Dr. Bary Castilla, neg  . Surgical Lipoma Removal; 12/13/2012  12/13/2012    There were no vitals filed for this visit.   Pelvic Floor Physical Therapy Treatment Note  SCREENING  Changes in medications, allergies, or medical history?: Has gone back to consistently using her colace daily after going 4 days without a BM.   SUBJECTIVE  Patient reports: Used the tens unit on a burst mode 1x per day for 30 min. over the week and does not feel that she noticed a difference but has upped her colace use so it is confounded.  Went "some" yesterday and has gone 3 times today. She feels that there is a pocket somewhere that stool is collecting in and does not move past until it is "too full".   Pain update: Pt. Did not c/o pain today  Patient Goals: Decrease back and hip pain, incontinence and  constipation.  OBJECTIVE  Changes in:  Palpation: Noted that Pt. Has exquisitely tender and protruding T10/11 vertebrae which corresponds to the same neurological level (T10-L2) that controls sympathetic innervation of the colon and the same nerves that innervate muscles that are putting pressure on the inferior nerves as they course toward the pelvic floor.   INTERVENTIONS THIS SESSION: Manual: Performed grade 3-4 PA mobs to T10 and T11 vertebrae to improve mobility and position and allow for decreased pressure on nerve roots leading to the colon and abdominal musculature to improve motility and decrease tightness and spasm that is leading to PFM spasms.  Theract: discussed with the patient her concerns that a "pocket" exists that fecal matter is getting stuck in and reviewed available past imaging with her that demonstrated a distended colon but no "pockets" visible to help her be able to move on mentally from this fixation to allow for other solutions to be acceptable to her and allow for successful implementation of treatment methods. Discussed her current stool consistency of being "ribbon-like" and asked Pt. To keep a log of her BM's for future review to determine efficacy of treatments.  Total time: 55 min.            No data recorded  PT Education - 02/15/18 1916    Education provided  Yes    Education Details  See Interventions this session    Person(s) Educated  Patient    Methods  Explanation    Comprehension  Verbalized understanding          PT Long Term Goals - 12/14/17 2054      PT LONG TERM GOAL #1   Title  Pt will be able to eliminate bowel movements of consistency of Type 4-5 across 75% of time instead of Type 6-7 in order to minimize straining    Time  10    Period  Weeks    Status  On-going      PT LONG TERM GOAL #2   Title  Pt will able to decrease COREFO score from 20% to < 15% in order to improve GI function    Time   12    Period  Weeks    Status  On-going      PT LONG TERM GOAL #3   Title  Pt will report decreased LBP / R hip pain with standing from 10 min ( VAS level 5/10) to >30 min ( < 2/10 ) in order to particiapte in community events  and to volunteer at [employer]    Time  8    Period  Weeks    Status  On-going      PT LONG TERM GOAL #4   Title  Pt will dem odecreased abdominal separation from 2 fingers width along linea alba to < 1 fingers width in order to promote motility and postural stability     Time  4    Period  Weeks    Status  Achieved      PT LONG TERM GOAL #5   Title  Pt will demo no pelvic obliquities and report no increased pain at L rib that presents prior to elimination of bowel movements in order to improve QOL     Time  2    Period  Weeks    Status  Achieved      PT LONG TERM GOAL #6   Title  Pt will increase her hip abduction strength from 3/5 B to > 4/5  in order to climb stairs at home with minimize risk for injuries     Time  10    Period  Weeks    Status  On-going      PT LONG TERM GOAL #7   Title  Pt will report being able to not leak prior to making it to the bathroom in order to travel    Time  12    Period  Weeks    Status  On-going      PT LONG TERM GOAL #8   Title  Pt will demo no pelvic floor tightness across 2 visits in order promote pelvic floor ROM for better function    Time  6    Period  Weeks    Status  New    Target Date  01/25/18            Plan - 02/15/18 1918    Clinical Impression Statement  Patient continues to have concerns about understanding the root cause of her problems and would like to have further imaging studies done to determine where the slow-down is occurring now that we have seen improvement in her ability to coordinate the PFM and the tension is decreased because she can go multiple days  without having a BM without having a firm stool "plug" in the distal colon. Today's session may  ave reveaed that a neurological  root for her slow motility is possible and we will continue to explore this at our next session as well as to re-assess the posterior PFM but Pt. would likely benefit from further motility testing as well.    Clinical Presentation  Stable    Clinical Decision Making  High    Rehab Potential  Good    PT Frequency  1x / week    PT Duration  12 weeks    PT Treatment/Interventions  Neuromuscular re-education;Therapeutic activities;Therapeutic exercise;Moist Heat;Patient/family education;Manual techniques;Functional mobility training;Stair training;Biofeedback    PT Next Visit Plan  Manual to thoracic (t10-L2) and lateral musculature, bow and arrow, side-stretch, rectal PFM re-assessment?    PT Home Exercise Plan  TA in quadruped, urge suppression, sit-to-stand, 3-way wall stretch, squatty potty.    Consulted and Agree with Plan of Care  Patient       Patient will benefit from skilled therapeutic intervention in order to improve the following deficits and impairments:  Decreased safety awareness, Decreased coordination, Decreased knowledge of use of DME, Decreased strength, Increased fascial restricitons, Decreased scar mobility, Decreased range of motion, Decreased cognition, Decreased mobility, Decreased endurance, Decreased balance, Increased muscle spasms, Postural dysfunction, Pain, Improper body mechanics  Visit Diagnosis: Sacrococcygeal disorders, not elsewhere classified  Other lack of coordination  Chronic right-sided low back pain, with sciatica presence unspecified     Problem List Patient Active Problem List   Diagnosis Date Noted  . Hypercalcemia 12/30/2015  . Memory impairment of gradual onset 12/30/2015  . Abnormal blood chemistry 06/11/2015  . Adult idiopathic generalized osteoporosis 06/11/2015  . Colon polyp 06/11/2015  . Closed fracture of proximal end of radius 06/11/2015  . Auditory impairment 06/11/2015  . Blood in the urine 06/11/2015  . Hypercholesteremia  06/11/2015  . Gonalgia 06/11/2015  . Herpes zona 06/11/2015  . Female genuine stress incontinence 06/11/2015  . Chronic constipation 09/02/2014  . Lipoma of back-left 07/18/2012   Willa Rough DPT, ATC Willa Rough 02/15/2018, 7:25 PM  [place] MAIN [facility] SERVICES [address] [place], [place], [address] Phone: [phone]   Fax:  [phone]  Name: Johany Hansman MRN: [id number] Date of Birth: [birth year]

## 2018-02-20 ENCOUNTER — Ambulatory Visit: Payer: Medicare Other | Attending: Gastroenterology

## 2018-02-20 DIAGNOSIS — R278 Other lack of coordination: Secondary | ICD-10-CM | POA: Diagnosis not present

## 2018-02-20 DIAGNOSIS — G8929 Other chronic pain: Secondary | ICD-10-CM

## 2018-02-20 DIAGNOSIS — M533 Sacrococcygeal disorders, not elsewhere classified: Secondary | ICD-10-CM | POA: Diagnosis not present

## 2018-02-20 DIAGNOSIS — M545 Low back pain: Secondary | ICD-10-CM | POA: Insufficient documentation

## 2018-02-21 NOTE — Therapy (Signed)
[place] MAIN [facility] SERVICES [address] [place], [place], [address] Phone: [phone]   Fax:  [phone]  Physical Therapy Treatment and Re-Assessment  Patient Details  Name: Barbara Gates MRN: [id number] Date of Birth: 26-Aug-1951 Referring Provider: Josephina Gip PA   Encounter Date: 02/20/2018    Past Medical History:  Diagnosis Date  . Hypercalcemia   . Hyperlipidemia    Has history of this  . Osteoporosis     Past Surgical History:  Procedure Laterality Date  . ABDOMINAL HYSTERECTOMY  1994   Total including ovarie. Due to endometriosis  . BREAST BIOPSY Left 11/08/2012   Dr. Bary Castilla, neg  . Surgical Lipoma Removal; 12/13/2012  12/13/2012    There were no vitals filed for this visit.    Pelvic Floor Physical Therapy Treatment Note and Re-Assessment  SCREENING  Changes in medications, allergies, or medical history?: no   SUBJECTIVE  Patient reports: She has only occasionally feeling a little "bubble" of urine try to leak out before she can do quick-flicks to stop it. She is having BM's regularly but requires 2 colace every-other day, multiple glasses of hot water, dietary restriction and other medications to keep this regular and stool is very soft/stringy and can take up to 3 trips to fully empty.  Pain update: Patient made no complaint of back pain today  Patient Goals: Decrease back and hip pain, incontinence and constipation.  OBJECTIVE  Changes in: Posture/Observations:  Forward head and rounded shoulders, posterior pelvic tilt in standing, anterior pelvic tilt in sitting.  Range of Motion/Flexibilty:  Decreased side-bending and rotation ROM B. Little thoracic mobility. Decreased "spring" in all of thoracic spine with tenderness at various levels and protruding around the T10 level.  Pelvic floor: Decreased spasms, occurring at lower level than formerly present.   Gait Analysis: Decreased  thoracic rotation with ambulation.  INTERVENTIONS THIS SESSION: Manual: TP release to obliques, QL, Psoas, and iliacus on L and grade 3-4 PA mobs to thoracic spine at T8-T11 for improved mobility and decreased tension on spinal nerve roots for these levels to improve motility. Self-care: discussed food and BM diary with patient and made a plan to continue with no modifications other than manual treatment today to help [place] determine what is/is not working before making more changes. Explained with pictures how the nerves exit the spine and where they course through the myofascial planes to help Pt. Buy-in to treatment and trust in it due to high levels of anxiety and consistent fishing for answers/easy fixes.   Total time: 42mn.                               PT Long Term Goals - 02/21/18 1359      PT LONG TERM GOAL #1   Title  Pt will be able to eliminate bowel movements of consistency of Type 4-5 across 75% of time instead of Type 6-7 in order to minimize straining    Time  10    Period  Weeks    Status  On-going    Target Date  04/04/18      PT LONG TERM GOAL #2   Title  Pt will able to decrease COREFO score from 20% to < 15% in order to improve GI function    Time  12    Period  Weeks    Status  Unable to assess    Target Date  04/04/18      PT LONG TERM GOAL #3   Title  Pt will report decreased LBP / R hip pain with standing from 10 min ( VAS level 5/10) to >30 min ( < 2/10 ) in order to particiapte in community events  and to volunteer at [employer]    Time  8    Period  Weeks    Status  Achieved    Target Date  02/14/18      PT LONG TERM GOAL #4   Title  Pt will dem odecreased abdominal separation from 2 fingers width along linea alba to < 1 fingers width in order to promote motility and postural stability     Time  4    Period  Weeks    Status  Achieved    Target Date  12/20/17      PT LONG TERM GOAL #5   Title  Pt will demo no pelvic  obliquities and report no increased pain at L rib that presents prior to elimination of bowel movements in order to improve QOL     Time  2    Period  Weeks    Status  Achieved    Target Date  12/07/17      PT LONG TERM GOAL #6   Title  Pt will increase her hip abduction strength from 3/5 B to > 4/5  in order to climb stairs at home with minimize risk for injuries     Time  10    Period  Weeks    Status  On-going    Target Date  04/04/18      PT LONG TERM GOAL #7   Title  Pt will report being able to not leak prior to making it to the bathroom in order to travel    Time  12    Period  Weeks    Status  Achieved    Target Date  02/15/18      PT LONG TERM GOAL #8   Title  Pt will demo no pelvic floor tightness across 2 visits in order promote pelvic floor ROM for better function    Time  6    Period  Weeks    Status  On-going    Target Date  04/04/18            Plan - 02/21/18 1411    Clinical Impression Statement  Pt. has met many of her goals and is making progress toward others, notind decreased pain, improved alignment and sacral mobility and decreased urinary symptoms, but continues to demonstrate poor posture and constipation. Patient continues to have concerns about understanding the root cause of her problems and would like to have further imaging studies done to determine where the slow-down is occurring now that her pelvic floor is more relaxed and well coordinated because she can go multiple days without having a BM without having a firm stool "plug" in the distal colon. She is passing a normal amount of stool that is very soft but only with use of multiple medications. Last week's session revealed supporting evidence that a neurological root (potential entrapment) for her slow motility is possible and due to her poor posture, decreased spinal mobility, and increased spasms through the lateral abdomen this seems likely so we will pursue treating this issue for 6 more weeks.  Pt. would likely benefit from further motility testing as well.     Clinical Presentation  Stable    Clinical  Decision Making  High    Rehab Potential  Good    PT Frequency  1x / week    PT Duration  6 weeks    PT Treatment/Interventions  Neuromuscular re-education;Therapeutic activities;Therapeutic exercise;Moist Heat;Patient/family education;Manual techniques;Functional mobility training;Stair training;Biofeedback    PT Next Visit Plan  Manual to thoracic (t10-L2) and lateral musculature, bow and arrow, side-stretch, rectal PFM re-assessment?    PT Home Exercise Plan  TA in quadruped, urge suppression, sit-to-stand, 3-way wall stretch, squatty potty.    Consulted and Agree with Plan of Care  Patient       Patient will benefit from skilled therapeutic intervention in order to improve the following deficits and impairments:  Decreased safety awareness, Decreased coordination, Decreased knowledge of use of DME, Decreased strength, Increased fascial restricitons, Decreased scar mobility, Decreased range of motion, Decreased cognition, Decreased mobility, Decreased endurance, Decreased balance, Increased muscle spasms, Postural dysfunction, Pain, Improper body mechanics  Visit Diagnosis: Sacrococcygeal disorders, not elsewhere classified  Other lack of coordination  Chronic right-sided low back pain, with sciatica presence unspecified     Problem List Patient Active Problem List   Diagnosis Date Noted  . Hypercalcemia 12/30/2015  . Memory impairment of gradual onset 12/30/2015  . Abnormal blood chemistry 06/11/2015  . Adult idiopathic generalized osteoporosis 06/11/2015  . Colon polyp 06/11/2015  . Closed fracture of proximal end of radius 06/11/2015  . Auditory impairment 06/11/2015  . Blood in the urine 06/11/2015  . Hypercholesteremia 06/11/2015  . Gonalgia 06/11/2015  . Herpes zona 06/11/2015  . Female genuine stress incontinence 06/11/2015  . Chronic constipation 09/02/2014   . Lipoma of back-left 07/18/2012   Willa Rough DPT, ATC Willa Rough 02/21/2018, 5:49 PM  [place] MAIN [facility] SERVICES [address] [place], [place], [address] Phone: [phone]   Fax:  [phone]  Name: Barbara Gates MRN: [id number] Date of Birth: 12-08-1950

## 2018-02-27 ENCOUNTER — Ambulatory Visit: Payer: Medicare Other

## 2018-02-28 ENCOUNTER — Ambulatory Visit: Payer: Medicare Other

## 2018-02-28 DIAGNOSIS — R278 Other lack of coordination: Secondary | ICD-10-CM

## 2018-02-28 DIAGNOSIS — M533 Sacrococcygeal disorders, not elsewhere classified: Secondary | ICD-10-CM | POA: Diagnosis not present

## 2018-02-28 DIAGNOSIS — G8929 Other chronic pain: Secondary | ICD-10-CM

## 2018-02-28 DIAGNOSIS — M545 Low back pain: Secondary | ICD-10-CM

## 2018-02-28 NOTE — Therapy (Signed)
[place] MAIN [facility] SERVICES [address] [place], [place], [address] Phone: [phone]   Fax:  [phone]  Physical Therapy Treatment  Patient Details  Name: Barbara Gates MRN: [id number] Date of Birth: 11-25-1950 Referring Provider: Josephina Gip PA   Encounter Date: 02/28/2018  PT End of Session - 02/28/18 1321    Visit Number  10    Number of Visits  17    Date for PT Re-Evaluation  04/04/18    Authorization Type  medicare    Authorization Time Period  04/04/2018    Authorization - Visit Number  10    Authorization - Number of Visits  17    PT Start Time  5852    PT Stop Time  1108    PT Time Calculation (min)  66 min    Activity Tolerance  Patient tolerated treatment well    Behavior During Therapy  [facility] for tasks assessed/performed       Past Medical History:  Diagnosis Date  . Hypercalcemia   . Hyperlipidemia    Has history of this  . Osteoporosis     Past Surgical History:  Procedure Laterality Date  . ABDOMINAL HYSTERECTOMY  1994   Total including ovarie. Due to endometriosis  . BREAST BIOPSY Left 11/08/2012   Dr. Bary Castilla, neg  . Surgical Lipoma Removal; 12/13/2012  12/13/2012    There were no vitals filed for this visit.   Pelvic Floor Physical Therapy Treatment Note  SCREENING  Changes in medications, allergies, or medical history?: Decreased mirilax by 50%     SUBJECTIVE  Patient reports: She decreased Mirilax by 50% on Thursday when she had 3 small BM's throughout the day and did not have another BM until Tuesday when she started using the TENS unit in the morning. She had a more formed "normal" size BM on Tuesday and Wednesday following this switch. She still has some "drops" of leakage and is feeling that they come before she has any urge to urinate so she cannot do quick-flicks to prevent it.   Pain update: Pt. Made no c/o pain today.  Patient Goals: Decrease back and hip pain,  incontinence and constipation.   OBJECTIVE  Changes in:  Range of Motion/Flexibilty:  Decreased B rotation ROM and side-bending  Palpation: Decreased TTP through L obliques.    INTERVENTIONS THIS SESSION: Therex: Educated on and practiced bow-and-arrow and side-stretch to improve thoracic mobility and decrease tension of muscles that are acting on the pelvis and take pressure off of nerve roots. Manual: Provided overpressure into torso rotation B to improve ROM and spinal mobility for decreased tension on the nerves innervating the PFM. NM re-ed: Performed guided scapular retraction/depression with and without torso rotation to improve scapular dyskenisia that prevented patient from performing bow-and-arrow exercise efficiently as well as to improve recruitment of the scapular stabilization muscles for overall posture training. Reviewed TA in quadruped and practiced to improve coordination and selective recruitment of the TA over the obliques to improve balance and correct pelvic positioning for optimal PFM function.     Total time:66 min.                           PT Education - 02/28/18 1320    Education provided  Yes    Education Details  See Pt. Instructions and Interventions this session.    Person(s) Educated  Patient    Methods  Explanation  Comprehension  Verbalized understanding;Returned demonstration;Verbal cues required;Tactile cues required;Need further instruction          PT Long Term Goals - 02/21/18 1359      PT LONG TERM GOAL #1   Title  Pt will be able to eliminate bowel movements of consistency of Type 4-5 across 75% of time instead of Type 6-7 in order to minimize straining    Time  10    Period  Weeks    Status  On-going    Target Date  04/04/18      PT LONG TERM GOAL #2   Title  Pt will able to decrease COREFO score from 20% to < 15% in order to improve GI function    Time  12    Period  Weeks    Status  Unable to assess     Target Date  04/04/18      PT LONG TERM GOAL #3   Title  Pt will report decreased LBP / R hip pain with standing from 10 min ( VAS level 5/10) to >30 min ( < 2/10 ) in order to particiapte in community events  and to volunteer at [employer]    Time  8    Period  Weeks    Status  Achieved    Target Date  02/14/18      PT LONG TERM GOAL #4   Title  Pt will dem odecreased abdominal separation from 2 fingers width along linea alba to < 1 fingers width in order to promote motility and postural stability     Time  4    Period  Weeks    Status  Achieved    Target Date  12/20/17      PT LONG TERM GOAL #5   Title  Pt will demo no pelvic obliquities and report no increased pain at L rib that presents prior to elimination of bowel movements in order to improve QOL     Time  2    Period  Weeks    Status  Achieved    Target Date  12/07/17      PT LONG TERM GOAL #6   Title  Pt will increase her hip abduction strength from 3/5 B to > 4/5  in order to climb stairs at home with minimize risk for injuries     Time  10    Period  Weeks    Status  On-going    Target Date  04/04/18      PT LONG TERM GOAL #7   Title  Pt will report being able to not leak prior to making it to the bathroom in order to travel    Time  12    Period  Weeks    Status  Achieved    Target Date  02/15/18      PT LONG TERM GOAL #8   Title  Pt will demo no pelvic floor tightness across 2 visits in order promote pelvic floor ROM for better function    Time  6    Period  Weeks    Status  On-going    Target Date  04/04/18            Plan - 02/28/18 2234    Clinical Impression Statement  Pt. responded well to all interventions today and demonstrated understanding of all education provided. Continue per POC.    Clinical Presentation  Stable    Clinical Decision Making  High  Rehab Potential  Good    PT Frequency  1x / week    PT Duration  6 weeks    PT Treatment/Interventions  Neuromuscular  re-education;Therapeutic activities;Therapeutic exercise;Moist Heat;Patient/family education;Manual techniques;Functional mobility training;Stair training;Biofeedback    PT Next Visit Plan  Manual to thoracic (t10-L2) and lateral musculature, PFM re-assessment    PT Home Exercise Plan  TA in quadruped, urge suppression, sit-to-stand, 3-way wall stretch, squatty potty, Bow-and-arrow, side-stretch    Consulted and Agree with Plan of Care  Patient       Patient will benefit from skilled therapeutic intervention in order to improve the following deficits and impairments:  Decreased safety awareness, Decreased coordination, Decreased knowledge of use of DME, Decreased strength, Increased fascial restricitons, Decreased scar mobility, Decreased range of motion, Decreased cognition, Decreased mobility, Decreased endurance, Decreased balance, Increased muscle spasms, Postural dysfunction, Pain, Improper body mechanics  Visit Diagnosis: Sacrococcygeal disorders, not elsewhere classified  Other lack of coordination  Chronic right-sided low back pain, with sciatica presence unspecified     Problem List Patient Active Problem List   Diagnosis Date Noted  . Hypercalcemia 12/30/2015  . Memory impairment of gradual onset 12/30/2015  . Abnormal blood chemistry 06/11/2015  . Adult idiopathic generalized osteoporosis 06/11/2015  . Colon polyp 06/11/2015  . Closed fracture of proximal end of radius 06/11/2015  . Auditory impairment 06/11/2015  . Blood in the urine 06/11/2015  . Hypercholesteremia 06/11/2015  . Gonalgia 06/11/2015  . Herpes zona 06/11/2015  . Female genuine stress incontinence 06/11/2015  . Chronic constipation 09/02/2014  . Lipoma of back-left 07/18/2012   Willa Rough DPT, ATC Willa Rough 02/28/2018, 10:43 PM  [place] MAIN [facility] SERVICES [address] [place], [place], [address] Phone: [phone]   Fax:  [phone]  Name:  Lakisha Peyser MRN: [id number] Date of Birth: 08/02/1951

## 2018-02-28 NOTE — Patient Instructions (Signed)
   Keep bottom leg straight, bend top leg and let the knee rest down toward the bed/ground. Inhale forward and exhale as you lead with the shoulder blade and rotate the upper torso back, keeping the arm and upper shoulder muscles relaxed    Inhale, feeling the breath help lengthen the distance between the ribs and the hip. Breathe out and relax, allowing gravity to pull you a little deeper into the stretch. Do 5 breaths on each side and repeat 2-3 times on each side 1-2 times per day.     Breathe in, let belly relax down toward the floor and then breathe out (make "hiss" sound), pulling the lower belly in toward the backbone. Use the "low-low-low" tummy muscles to lead the action!  Repeat this _10x2__ times _1-2__ times per day

## 2018-03-06 ENCOUNTER — Ambulatory Visit: Payer: Medicare Other

## 2018-03-06 DIAGNOSIS — G8929 Other chronic pain: Secondary | ICD-10-CM | POA: Diagnosis not present

## 2018-03-06 DIAGNOSIS — M545 Low back pain: Secondary | ICD-10-CM | POA: Diagnosis not present

## 2018-03-06 DIAGNOSIS — R278 Other lack of coordination: Secondary | ICD-10-CM | POA: Diagnosis not present

## 2018-03-06 DIAGNOSIS — M533 Sacrococcygeal disorders, not elsewhere classified: Secondary | ICD-10-CM | POA: Diagnosis not present

## 2018-03-06 NOTE — Therapy (Signed)
[place] MAIN [facility] SERVICES [address] [place], [place], [address] Phone: [phone]   Fax:  [phone]  Physical Therapy Treatment  Patient Details  Name: Barbara Gates MRN: [id number] Date of Birth: [birth year] Referring Provider: Josephina Gip PA   Encounter Date: 03/06/2018  PT End of Session - 03/07/18 0840    Visit Number  11    Number of Visits  17    Date for PT Re-Evaluation  04/04/18    Authorization Type  medicare    Authorization Time Period  04/04/2018    Authorization - Visit Number  11    Authorization - Number of Visits  17    PT Start Time  1500    PT Stop Time  1610    PT Time Calculation (min)  70 min    Activity Tolerance  Patient tolerated treatment well    Behavior During Therapy  [facility] for tasks assessed/performed       Past Medical History:  Diagnosis Date  . Hypercalcemia   . Hyperlipidemia    Has history of this  . Osteoporosis     Past Surgical History:  Procedure Laterality Date  . ABDOMINAL HYSTERECTOMY  1994   Total including ovarie. Due to endometriosis  . BREAST BIOPSY Left 11/08/2012   Dr. Bary Castilla, neg  . Surgical Lipoma Removal; 12/13/2012  12/13/2012    There were no vitals filed for this visit.    Pelvic Floor Physical Therapy Treatment Note  SCREENING  Changes in medications, allergies, or medical history?: no     SUBJECTIVE  Patient reports: Has resorted to prune juice clean-out even though she did no two go more than 2 days without a BM. Is now "cleaned out" but had gained 3 pounds prior to doing her clean out. Has nocturia x1 at 3 am but is drinking water right up to bedtime, discussed decreasing by cutting one glass out between dinner and bed.    Pain update: Has some pain in the lower back when mowing the lawn and some in her L shoulder with performance of bow-and-arrow exercise (corrected with education today). Arthritis in wrist. Would not give a  number.  Patient Goals: Decrease back and hip pain, incontinence and constipation.   OBJECTIVE  Changes in:  Range of Motion/Flexibilty:  Pt demonstrates improved mobility at L1 compared to previous palpation but demonstrates pain and restriction above and below this segment today prior to treatment, improved following.   INTERVENTIONS THIS SESSION: Self-care: educated Pt. On the risk of not allowing her body enough time to process food and jumping to extreme measures to clean out the colon when she is having BM's of an acceptable consistency. Educated her on the normal range of time expected for food to process and how there should not necessarily be a total clean out of the colon every day to be healthy. Re-iterated the symptoms that should raise concern. Educated on proper body-mechanics when mowing the lawn to decrease LBP and prevent increased tension of PFM. Manual: PA to thoracic spine for improved mobility, decreased pain, and decreased pressure on nerves innervating the abdomen and pelvis.  Therex: reviewed and practiced bow-and arrow and seated side-stretch to problem-shoot for increased pain in Pt's shoulder and confusion about positioning with stretch to improve performance at home. Practiced prone press-ups to improve curvature of lumbar spine due to flat-pack posture causing muscular imbalance.   Total time: 70 min.  PT Education - 03/07/18 0839    Education provided  Yes    Education Details  See Interventions this session    Person(s) Educated  Patient    Methods  Explanation;Demonstration;Verbal cues;Tactile cues    Comprehension  Verbalized understanding;Returned demonstration;Verbal cues required;Tactile cues required;Need further instruction          PT Long Term Goals - 02/21/18 1359      PT LONG TERM GOAL #1   Title  Pt will be able to eliminate bowel movements of consistency of Type 4-5 across 75% of time instead  of Type 6-7 in order to minimize straining    Time  10    Period  Weeks    Status  On-going    Target Date  04/04/18      PT LONG TERM GOAL #2   Title  Pt will able to decrease COREFO score from 20% to < 15% in order to improve GI function    Time  12    Period  Weeks    Status  Unable to assess    Target Date  04/04/18      PT LONG TERM GOAL #3   Title  Pt will report decreased LBP / R hip pain with standing from 10 min ( VAS level 5/10) to >30 min ( < 2/10 ) in order to particiapte in community events  and to volunteer at [employer]    Time  8    Period  Weeks    Status  Achieved    Target Date  02/14/18      PT LONG TERM GOAL #4   Title  Pt will dem odecreased abdominal separation from 2 fingers width along linea alba to < 1 fingers width in order to promote motility and postural stability     Time  4    Period  Weeks    Status  Achieved    Target Date  12/20/17      PT LONG TERM GOAL #5   Title  Pt will demo no pelvic obliquities and report no increased pain at L rib that presents prior to elimination of bowel movements in order to improve QOL     Time  2    Period  Weeks    Status  Achieved    Target Date  12/07/17      PT LONG TERM GOAL #6   Title  Pt will increase her hip abduction strength from 3/5 B to > 4/5  in order to climb stairs at home with minimize risk for injuries     Time  10    Period  Weeks    Status  On-going    Target Date  04/04/18      PT LONG TERM GOAL #7   Title  Pt will report being able to not leak prior to making it to the bathroom in order to travel    Time  12    Period  Weeks    Status  Achieved    Target Date  02/15/18      PT LONG TERM GOAL #8   Title  Pt will demo no pelvic floor tightness across 2 visits in order promote pelvic floor ROM for better function    Time  6    Period  Weeks    Status  On-going    Target Date  04/04/18            Plan - 03/07/18 0845  Clinical Impression Statement  Pt. Continues to have  high anxiety around the idea of going more than a day without a BM and requaires a biopsyhcosocial approach to treatment for behavioral modification. She responded well to all interventions today but will need review of concepts and exercises. Continue per POC.    Clinical Presentation  Stable    Clinical Decision Making  High    Rehab Potential  Good    PT Frequency  1x / week    PT Duration  6 weeks    PT Treatment/Interventions  Neuromuscular re-education;Therapeutic activities;Therapeutic exercise;Moist Heat;Patient/family education;Manual techniques;Functional mobility training;Stair training;Biofeedback    PT Next Visit Plan  Manual to lateral musculature, PFM re-assessment, L head and neck TP release.    PT Home Exercise Plan  TA in quadruped, urge suppression, sit-to-stand, 3-way wall stretch, squatty potty, Bow-and-arrow, side-stretch    Consulted and Agree with Plan of Care  Patient       Patient will benefit from skilled therapeutic intervention in order to improve the following deficits and impairments:  Decreased safety awareness, Decreased coordination, Decreased knowledge of use of DME, Decreased strength, Increased fascial restricitons, Decreased scar mobility, Decreased range of motion, Decreased cognition, Decreased mobility, Decreased endurance, Decreased balance, Increased muscle spasms, Postural dysfunction, Pain, Improper body mechanics  Visit Diagnosis: Sacrococcygeal disorders, not elsewhere classified  Other lack of coordination  Chronic right-sided low back pain, with sciatica presence unspecified     Problem List Patient Active Problem List   Diagnosis Date Noted  . Hypercalcemia 12/30/2015  . Memory impairment of gradual onset 12/30/2015  . Abnormal blood chemistry 06/11/2015  . Adult idiopathic generalized osteoporosis 06/11/2015  . Colon polyp 06/11/2015  . Closed fracture of proximal end of radius 06/11/2015  . Auditory impairment 06/11/2015  . Blood  in the urine 06/11/2015  . Hypercholesteremia 06/11/2015  . Gonalgia 06/11/2015  . Herpes zona 06/11/2015  . Female genuine stress incontinence 06/11/2015  . Chronic constipation 09/02/2014  . Lipoma of back-left 07/18/2012   Willa Rough DPT, ATC Willa Rough 03/07/2018, 8:50 AM  [place] MAIN [facility] SERVICES [address] [place], [place], [address] Phone: [phone]   Fax:  [phone]  Name: Dahlia Nifong MRN: [id number] Date of Birth: 1951-07-10

## 2018-03-23 ENCOUNTER — Ambulatory Visit: Payer: Medicare Other | Attending: Gastroenterology

## 2018-03-23 DIAGNOSIS — R278 Other lack of coordination: Secondary | ICD-10-CM | POA: Diagnosis not present

## 2018-03-23 DIAGNOSIS — M545 Low back pain: Secondary | ICD-10-CM | POA: Insufficient documentation

## 2018-03-23 DIAGNOSIS — G8929 Other chronic pain: Secondary | ICD-10-CM | POA: Diagnosis not present

## 2018-03-23 DIAGNOSIS — M533 Sacrococcygeal disorders, not elsewhere classified: Secondary | ICD-10-CM | POA: Diagnosis not present

## 2018-03-23 NOTE — Patient Instructions (Signed)
   Place foam roller or towel under your upper back between your shoulder blades. Support your head with your hands, elbows forward, and gently rock back and forth and side to side to improve motion in your back.   Work for ~ 30 seconds to a minute in each region, perform daily. Make sure to RELAX!!! And rock SLOWLY!  Move the foam roller or towel up and down to a few spots in the upper back, repeating the process.   Self Posterior Fourchette Stretching/Mobilization    1) Wash your hands and prop your body up so you can easily reach the vagina, bring hand-held mirror if desired.  2) Apply lubricant to the thumb and vaginal opening  3) Place thumb ~ 1/2 an inch into the vagina with the pad of the thumb pointed down and apply gentle pressure to the posterior fourchette.  4) Gently sweep the thumb side to side and in/out while maintaining pressure down toward the anus. Make sure the pressure is not so great that your muscles tighten up and guard, just enough to create slight discomfort.  Do this for ~ 3 min. Per night to decrease tightness and tenderness at the vaginal opening.

## 2018-03-23 NOTE — Therapy (Signed)
[place] MAIN [facility] SERVICES [address] [place], [place], [address] Phone: [phone]   Fax:  [phone]  Physical Therapy Treatment  Patient Details  Name: Barbara Gates MRN: [id number] Date of Birth: 21-May-1951 Referring Provider: Josephina Gip PA   Encounter Date: 03/23/2018  PT End of Session - 03/23/18 2141    Visit Number  12    Number of Visits  17    Date for PT Re-Evaluation  04/04/18    Authorization Type  medicare    Authorization Time Period  04/04/2018    Authorization - Visit Number  12    Authorization - Number of Visits  17    PT Start Time  1000    PT Stop Time  1100    PT Time Calculation (min)  60 min    Activity Tolerance  Patient tolerated treatment well    Behavior During Therapy  [facility] for tasks assessed/performed       Past Medical History:  Diagnosis Date  . Hypercalcemia   . Hyperlipidemia    Has history of this  . Osteoporosis     Past Surgical History:  Procedure Laterality Date  . ABDOMINAL HYSTERECTOMY  1994   Total including ovarie. Due to endometriosis  . BREAST BIOPSY Left 11/08/2012   Dr. Bary Castilla, neg  . Surgical Lipoma Removal; 12/13/2012  12/13/2012    There were no vitals filed for this visit.    Pelvic Floor Physical Therapy Treatment Note  SCREENING  Changes in medications, allergies, or medical history?: no     SUBJECTIVE  Patient reports: Has been "as normal as she has ever been but still not feeling comfortable enough to decrease any medications."  Her neighbor offered to mow the lawn on his riding mower but she does not want to let him because he wont let her pay him. No bladder leakage over past week!  Pain update:  Location of pain: L shoulder, R wrist ache Current pain:  0/10  Max pain:  2/10 Least pain:  0/10 Nature of pain: grinding  Patient Goals: Decrease constipation. Maintaining bladder improvement.   OBJECTIVE  Changes  in: Posture/Observations:  Patient has improved by ~ 30% in her posture.  Range of Motion/Flexibilty:  Pt. Demonstrates improved intra-articucular motion by ~25 % but will require further mobilization to achieve optimal mobility and decreased tightness for better posture. Pt. Also demonstrates ~ 30 % improved thoracic rotation ROM. Demonstrates coordinated and strong squeeze, release, and bulge.   Pelvic floor: Pt. Demonstrated moderate tenderness at coccygeus and high tenderness at posterior fourchette but the remainder of the muscles have remained with decreased from prior manual treatment.  INTERVENTIONS THIS SESSION: Self-care: instructed Pt. To continue with e-stim since she is noticing an improvement in BM function on days that she is using it. She was also encouraged to let the neighbor mow the lawn for her to decrease stress on her posture as we are trying to correct it. Educated on free app, headspace for mindfulness and decreased stress/anxiety and tension. Manual: re-assessed PFM to make sure improvement remains, educated Pt. On performing self posterior fourchette TP release and mobilization. Assessed and lightly mobilized thoracic spine and sacrum to determine improvement and continue to improve mobility. Educated Pt. On self-mobilization with towel roll and thoracic extension.   Total time: 60 min.  PT Education - 03/23/18 2139    Education provided  Yes    Education Details  See Pt. Instructions and Interventions this session.    Person(s) Educated  Patient    Methods  Explanation;Demonstration;Verbal cues;Handout;Tactile cues    Comprehension  Verbalized understanding;Returned demonstration;Verbal cues required;Tactile cues required;Need further instruction          PT Long Term Goals - 03/23/18 2124      PT LONG TERM GOAL #1   Title  Pt will be able to eliminate bowel movements of consistency of Type 4-5 across 75% of  time instead of Type 6-7 in order to minimize straining    Baseline  Pt. is demonstrating BM's between 5-6 at least 75% of the time but has not yet achieved grade 4     Time  10    Period  Weeks    Status  On-going    Target Date  05/18/18      PT LONG TERM GOAL #2   Title  Pt will able to decrease COREFO score from 20% to < 15% in order to improve GI function    Time  12    Period  Weeks    Status  Unable to assess    Target Date  05/18/18      PT LONG TERM GOAL #3   Title  Pt will report decreased LBP / R hip pain with standing from 10 min ( VAS level 5/10) to >30 min ( < 2/10 ) in order to particiapte in community events  and to volunteer at [employer]    Time  8    Period  Weeks    Status  Achieved    Target Date  02/14/18      PT LONG TERM GOAL #4   Title  Pt will dem odecreased abdominal separation from 2 fingers width along linea alba to < 1 fingers width in order to promote motility and postural stability     Time  4    Period  Weeks    Status  Achieved    Target Date  12/20/17      PT LONG TERM GOAL #5   Title  Pt will demo no pelvic obliquities and report no increased pain at L rib that presents prior to elimination of bowel movements in order to improve QOL     Time  2    Period  Weeks    Status  Achieved    Target Date  12/07/17      PT LONG TERM GOAL #6   Title  Pt will increase her hip abduction strength from 3/5 B to > 4/5  in order to climb stairs at home with minimize risk for injuries     Time  10    Period  Weeks    Status  On-going    Target Date  05/18/18      PT LONG TERM GOAL #7   Title  Pt will report being able to not leak prior to making it to the bathroom in order to travel    Time  12    Period  Weeks    Status  Achieved    Target Date  02/15/18      PT LONG TERM GOAL #8   Title  Pt will demo no pelvic floor tightness across 2 visits in order promote pelvic floor ROM for better function    Baseline  Pt. demonstrates significantly less  PFM tightness  with only 2-3 areas with moderate spasm compared to all muscles in high state of spasm and pain.    Time  6    Period  Weeks    Status  On-going    Target Date  05/18/18            Plan - 03/23/18 2142    Clinical Impression Statement  Pt. continues to make improvements and has met many goals and continues to make progress toward remaining ones. She is finally really bought-in to therapy and will benefit from continued therapy for 8 more weeks at 1x/week to work toward remaining goals with more emphasis on the radiculopathic model due to success of e-stim intervention and the amount of mobility that has already been restored to the spine. She has made ~ 30% improvement in physical restrictions of the spine and is expected to continue to see improvement in Sx. as mobility and posture are restored.     Clinical Presentation  Stable    Clinical Decision Making  High    Rehab Potential  Good    PT Frequency  1x / week    PT Duration  8 weeks    PT Treatment/Interventions  Neuromuscular re-education;Therapeutic activities;Therapeutic exercise;Moist Heat;Patient/family education;Manual techniques;Functional mobility training;Stair training;Biofeedback    PT Next Visit Plan  Thoracic and sacral mobility, TP release to paraspinals and neck/upper traps, rib mobilizations,     PT Home Exercise Plan  TA in quadruped, urge suppression, sit-to-stand, 3-way wall stretch, squatty potty, Bow-and-arrow, side-stretch    Consulted and Agree with Plan of Care  Patient       Patient will benefit from skilled therapeutic intervention in order to improve the following deficits and impairments:  Decreased safety awareness, Decreased coordination, Decreased knowledge of use of DME, Decreased strength, Increased fascial restricitons, Decreased scar mobility, Decreased range of motion, Decreased cognition, Decreased mobility, Decreased endurance, Decreased balance, Increased muscle spasms, Postural  dysfunction, Pain, Improper body mechanics  Visit Diagnosis: Sacrococcygeal disorders, not elsewhere classified  Other lack of coordination  Chronic right-sided low back pain, with sciatica presence unspecified     Problem List Patient Active Problem List   Diagnosis Date Noted  . Hypercalcemia 12/30/2015  . Memory impairment of gradual onset 12/30/2015  . Abnormal blood chemistry 06/11/2015  . Adult idiopathic generalized osteoporosis 06/11/2015  . Colon polyp 06/11/2015  . Closed fracture of proximal end of radius 06/11/2015  . Auditory impairment 06/11/2015  . Blood in the urine 06/11/2015  . Hypercholesteremia 06/11/2015  . Gonalgia 06/11/2015  . Herpes zona 06/11/2015  . Female genuine stress incontinence 06/11/2015  . Chronic constipation 09/02/2014  . Lipoma of back-left 07/18/2012   Willa Rough DPT, ATC Willa Rough 03/23/2018, 9:48 PM  [place] MAIN [facility] SERVICES [address] [place], [place], [address] Phone: [phone]   Fax:  [phone]  Name: Barbara Gates MRN: [id number] Date of Birth: 10/25/1951

## 2018-03-27 DIAGNOSIS — K5900 Constipation, unspecified: Secondary | ICD-10-CM | POA: Diagnosis not present

## 2018-03-27 DIAGNOSIS — Z6821 Body mass index (BMI) 21.0-21.9, adult: Secondary | ICD-10-CM | POA: Diagnosis not present

## 2018-04-05 ENCOUNTER — Encounter: Payer: Self-pay | Admitting: Physician Assistant

## 2018-04-05 ENCOUNTER — Ambulatory Visit (INDEPENDENT_AMBULATORY_CARE_PROVIDER_SITE_OTHER): Payer: Medicare Other | Admitting: Physician Assistant

## 2018-04-05 ENCOUNTER — Other Ambulatory Visit: Payer: Self-pay

## 2018-04-05 VITALS — BP 142/84 | Wt 144.0 lb

## 2018-04-05 DIAGNOSIS — Z23 Encounter for immunization: Secondary | ICD-10-CM

## 2018-04-05 DIAGNOSIS — G8929 Other chronic pain: Secondary | ICD-10-CM | POA: Diagnosis not present

## 2018-04-05 DIAGNOSIS — M25512 Pain in left shoulder: Secondary | ICD-10-CM | POA: Diagnosis not present

## 2018-04-05 DIAGNOSIS — K5909 Other constipation: Secondary | ICD-10-CM

## 2018-04-05 DIAGNOSIS — Z7189 Other specified counseling: Secondary | ICD-10-CM | POA: Diagnosis not present

## 2018-04-05 DIAGNOSIS — M25511 Pain in right shoulder: Secondary | ICD-10-CM | POA: Diagnosis not present

## 2018-04-05 DIAGNOSIS — Z7184 Encounter for health counseling related to travel: Secondary | ICD-10-CM

## 2018-04-05 NOTE — Progress Notes (Signed)
Patient: Barbara Gates Female    DOB: Apr 12, 1951   67 y.o.   MRN: [id number] Visit Date: 04/05/2018  Today's Provider: Mar Daring, PA-C   Chief Complaint  Patient presents with  . Immunizations    Hepatitis, rabies, shingles  . Shoulder Pain    bilateral  . Constipation   Subjective:    Shoulder Pain   The pain is present in the left shoulder and right shoulder. This is a recurrent problem. The problem occurs intermittently. Quality: popping, itch.  Constipation  This is a recurrent problem. The problem is unchanged. She has tried laxatives and stool softeners (therapies) for the symptoms.   Patient is here for second hepatitis a vaccine.  She is interested in rabies and shingles vaccine.  Patient has traveled to [place] in the past and plans to travel again. She is interested in rabies vaccine because there are bats around her house. Not known to be in house. patient has already contacted Health Dept for vaccination. Will be added to wait list for Shingrix.     Allergies  Allergen Reactions  . Other     Mango food     Current Outpatient Medications:  .  aspirin 81 MG chewable tablet, Chew 81 mg by mouth. 4 times a week, Disp: , Rfl:  .  Calcium-Phosphorus-Vitamin D (CALCIUM GUMMIES PO), Take 250 mg by mouth 2 (two) times daily., Disp: , Rfl:  .  Carboxymethylcellul-Glycerin (LUBRICATING EYE DROPS OP), Apply to eye. , Disp: , Rfl:  .  cyanocobalamin 100 MCG tablet, Take by mouth daily., Disp: , Rfl:  .  docusate sodium (COLACE) 50 MG capsule, Take 50 mg by mouth 2 (two) times daily., Disp: , Rfl:  .  ibuprofen (ADVIL,MOTRIN) 200 MG tablet, Take 200 mg by mouth every 6 (six) hours as needed. , Disp: , Rfl:  .  L-Lysine HCl 500 MG TABS, Take by mouth., Disp: , Rfl:  .  L-LYSINE PO, Take by mouth., Disp: , Rfl:  .  Magnesium 400 MG CAPS, Take 250 mg by mouth 2 (two) times daily. , Disp: , Rfl:  .  Multiple Minerals-Vitamins (CALCIUM CITRATE-MAG-MINERALS  PO), Take 250 mg by mouth daily., Disp: , Rfl:  .  Multiple Vitamin (MULTIVITAMIN) tablet, Take 1 tablet by mouth daily., Disp: , Rfl:  .  Multiple Vitamins-Minerals (EYE VITAMINS) CAPS, Take by mouth daily., Disp: , Rfl:  .  Polyethylene Glycol 3350 (MIRALAX PO), Take by mouth daily. , Disp: , Rfl:  .  raloxifene (EVISTA) 60 MG tablet, Take 1 tablet (60 mg total) by mouth daily., Disp: 90 tablet, Rfl: 1 .  Sennosides-Docusate Sodium (SENNA-DOCUSATE SODIUM PO), Take by mouth. , Disp: , Rfl:  .  TURMERIC PO, Take by mouth., Disp: , Rfl:   Review of Systems  Constitutional: Negative.   Respiratory: Negative.   Cardiovascular: Negative.   Gastrointestinal: Positive for constipation.  Musculoskeletal: Positive for arthralgias (bilateral shoulders).  Neurological: Negative.     Social History   Tobacco Use  . Smoking status: Never Smoker  . Smokeless tobacco: Never Used  Substance Use Topics  . Alcohol use: Yes    Alcohol/week: 0.6 oz    Types: 1 Glasses of wine per week    Comment: margarita   Objective:   Wt 144 lb (65.3 kg)   BMI 21.27 kg/m  Vitals:   04/05/18 1325  Weight: 144 lb (65.3 kg)     Physical Exam  Constitutional: She appears  well-developed and well-nourished. No distress.  Neck: Normal range of motion. Neck supple. No JVD present. No tracheal deviation present. No thyromegaly present.  Cardiovascular: Normal rate, regular rhythm and normal heart sounds. Exam reveals no gallop and no friction rub.  No murmur heard. Pulmonary/Chest: Effort normal and breath sounds normal. No respiratory distress. She has no wheezes. She has no rales.  Musculoskeletal:       Right shoulder: Normal. She exhibits normal range of motion, no tenderness, no bony tenderness, no swelling, no effusion, no crepitus, no deformity, no laceration, no pain, no spasm, normal pulse and normal strength.       Left shoulder: Normal. She exhibits normal range of motion, no tenderness, no bony  tenderness, no swelling, no effusion, no crepitus, no deformity, no laceration, no pain, no spasm, normal pulse and normal strength.  Lymphadenopathy:    She has no cervical adenopathy.  Skin: She is not diaphoretic.  Vitals reviewed.     Assessment & Plan:     1. Chronic pain of both shoulders Doing some PT for this now. May add formal PT later at patient request. Do not suspect any major issue at this time as patient has full ROM and normal strength.   2. Chronic constipation Followed by GI. Doing pelvic floor therapy. Improving. Continue Miralax, Colace and magnesium.  3. Counseling about travel Hep A #2 Vaccine given to patient without complications. Patient sat for 15 minutes after administration and was tolerated well without adverse effects. - Hepatitis A vaccine adult IM  4. Need for hepatitis A immunization See above medical treatment plan. - Hepatitis A vaccine adult IM       Mar Daring, PA-C  [facility]

## 2018-04-06 ENCOUNTER — Ambulatory Visit: Payer: Medicare Other

## 2018-04-06 DIAGNOSIS — R278 Other lack of coordination: Secondary | ICD-10-CM

## 2018-04-06 DIAGNOSIS — M533 Sacrococcygeal disorders, not elsewhere classified: Secondary | ICD-10-CM | POA: Diagnosis not present

## 2018-04-06 DIAGNOSIS — G8929 Other chronic pain: Secondary | ICD-10-CM

## 2018-04-06 DIAGNOSIS — M545 Low back pain: Secondary | ICD-10-CM

## 2018-04-06 NOTE — Therapy (Signed)
[place] MAIN [facility] SERVICES [address] [place], [place], [address] Phone: [phone]   Fax:  [phone]  Physical Therapy Treatment  Patient Details  Name: Barbara Gates MRN: [id number] Date of Birth: 1951-06-25 Referring Provider: Josephina Gip PA   Encounter Date: 04/06/2018  PT End of Session - 04/06/18 2109    Visit Number  13    Number of Visits  20    Date for PT Re-Evaluation  05/18/18    Authorization Type  medicare    Authorization Time Period  05/18/2018    Authorization - Visit Number  1    Authorization - Number of Visits  8    PT Start Time  1003    PT Stop Time  1103    PT Time Calculation (min)  60 min    Activity Tolerance  Patient tolerated treatment well    Behavior During Therapy  [facility] for tasks assessed/performed       Past Medical History:  Diagnosis Date  . Hypercalcemia   . Hyperlipidemia    Has history of this  . Osteoporosis     Past Surgical History:  Procedure Laterality Date  . ABDOMINAL HYSTERECTOMY  1994   Total including ovarie. Due to endometriosis  . BREAST BIOPSY Left 11/08/2012   Dr. Bary Castilla, neg  . Surgical Lipoma Removal; 12/13/2012  12/13/2012    There were no vitals filed for this visit.   Pelvic Floor Physical Therapy Treatment Note  SCREENING  Changes in medications, allergies, or medical history?: no   SUBJECTIVE  Patient reports: Her doctors are not recommending changing her medication regmen at this time. She is doing well, no major set backs and feels that the tens is helping significantly.   Pain update:   Location of pain: L shoulder, R wrist ache Current pain: 0/10  Max pain: 2/10 Least pain: 0/10 Nature of pain:grinding   Patient Goals: Decrease constipation. Maintaining bladder improvement.    OBJECTIVE  Changes in: Posture/Observations:  Forward head and shoulders but moderate  Improvement in kyphosis.  Range of  Motion/Flexibilty:  Decreased rib mobility and tenderness with pressure on posterior costovertebral joints through much of the ribcage B.  Improved following treatment with some remaining on R in more superior region.  INTERVENTIONS THIS SESSION: Manual: performed rib mobilization to all levels on L and through the middle third on the R (due to time restriction and this area having the greatest restriction) in order to improve costal mobility to decrease pressure on thoracic nerves as they course anteriorly and to aid in improving posture and diaphragmatic breathing to improve pelvic floor function. Theract: reviewed bow-and-arrow exercise and offered modifications to decrease stress on the shoulder. Discussed success of tens unit and where to get a tens unit for longer-term use.  Total time: 60 min.                           PT Education - 04/06/18 2108    Education provided  Yes    Education Details  See Interventions this session    Person(s) Educated  Patient    Methods  Explanation;Verbal cues    Comprehension  Verbalized understanding          PT Long Term Goals - 03/23/18 2124      PT LONG TERM GOAL #1   Title  Pt will be able to eliminate bowel movements of consistency of Type 4-5 across  75% of time instead of Type 6-7 in order to minimize straining    Baseline  Pt. is demonstrating BM's between 5-6 at least 75% of the time but has not yet achieved grade 4     Time  10    Period  Weeks    Status  On-going    Target Date  05/18/18      PT LONG TERM GOAL #2   Title  Pt will able to decrease COREFO score from 20% to < 15% in order to improve GI function    Time  12    Period  Weeks    Status  Unable to assess    Target Date  05/18/18      PT LONG TERM GOAL #3   Title  Pt will report decreased LBP / R hip pain with standing from 10 min ( VAS level 5/10) to >30 min ( < 2/10 ) in order to particiapte in community events  and to volunteer at [employer]    Time  8    Period  Weeks    Status  Achieved    Target Date  02/14/18      PT LONG TERM GOAL #4   Title  Pt will dem odecreased abdominal separation from 2 fingers width along linea alba to < 1 fingers width in order to promote motility and postural stability     Time  4    Period  Weeks    Status  Achieved    Target Date  12/20/17      PT LONG TERM GOAL #5   Title  Pt will demo no pelvic obliquities and report no increased pain at L rib that presents prior to elimination of bowel movements in order to improve QOL     Time  2    Period  Weeks    Status  Achieved    Target Date  12/07/17      PT LONG TERM GOAL #6   Title  Pt will increase her hip abduction strength from 3/5 B to > 4/5  in order to climb stairs at home with minimize risk for injuries     Time  10    Period  Weeks    Status  On-going    Target Date  05/18/18      PT LONG TERM GOAL #7   Title  Pt will report being able to not leak prior to making it to the bathroom in order to travel    Time  12    Period  Weeks    Status  Achieved    Target Date  02/15/18      PT LONG TERM GOAL #8   Title  Pt will demo no pelvic floor tightness across 2 visits in order promote pelvic floor ROM for better function    Baseline  Pt. demonstrates significantly less PFM tightness with only 2-3 areas with moderate spasm compared to all muscles in high state of spasm and pain.    Time  6    Period  Weeks    Status  On-going    Target Date  05/18/18            Plan - 04/06/18 2111    Clinical Impression Statement  Pt responded well to all interventions today and seems more calm than at prior visits. she has started doing tai chi for active meditation and gentle movement and decided that she did not want  to use headspace because it eventually has a price associated with it. She demonstrated great motion improvement on the L but will require more rib mobility to the R side at next visit. Continue per POC.    Clinical  Presentation  Stable    Clinical Decision Making  High    Rehab Potential  Good    PT Frequency  1x / week    PT Duration  8 weeks    PT Treatment/Interventions  Neuromuscular re-education;Therapeutic activities;Therapeutic exercise;Moist Heat;Patient/family education;Manual techniques;Functional mobility training;Stair training;Biofeedback    PT Next Visit Plan  Thoracic and sacral mobility, TP release to paraspinals and neck/upper traps, rib mobilizations,     PT Home Exercise Plan  TA in quadruped, urge suppression, sit-to-stand, 3-way wall stretch, squatty potty, Bow-and-arrow, side-stretch    Consulted and Agree with Plan of Care  Patient       Patient will benefit from skilled therapeutic intervention in order to improve the following deficits and impairments:  Decreased safety awareness, Decreased coordination, Decreased knowledge of use of DME, Decreased strength, Increased fascial restricitons, Decreased scar mobility, Decreased range of motion, Decreased cognition, Decreased mobility, Decreased endurance, Decreased balance, Increased muscle spasms, Postural dysfunction, Pain, Improper body mechanics  Visit Diagnosis: Sacrococcygeal disorders, not elsewhere classified  Other lack of coordination  Chronic right-sided low back pain, with sciatica presence unspecified     Problem List Patient Active Problem List   Diagnosis Date Noted  . Chronic pain of both shoulders 04/05/2018  . Hypercalcemia 12/30/2015  . Memory impairment of gradual onset 12/30/2015  . Abnormal blood chemistry 06/11/2015  . Adult idiopathic generalized osteoporosis 06/11/2015  . Colon polyp 06/11/2015  . Closed fracture of proximal end of radius 06/11/2015  . Auditory impairment 06/11/2015  . Blood in the urine 06/11/2015  . Hypercholesteremia 06/11/2015  . Gonalgia 06/11/2015  . Herpes zona 06/11/2015  . Female genuine stress incontinence 06/11/2015  . Chronic constipation 09/02/2014  . Lipoma of  back-left 07/18/2012   Willa Rough DPT, ATC Willa Rough 04/06/2018, 9:16 PM  [place] MAIN [facility] SERVICES [address] [place], [place], [address] Phone: [phone]   Fax:  [phone]  Name: Barbara Gates MRN: [id number] Date of Birth: 1951/06/28

## 2018-04-12 ENCOUNTER — Ambulatory Visit: Payer: Medicare Other

## 2018-04-12 DIAGNOSIS — M533 Sacrococcygeal disorders, not elsewhere classified: Secondary | ICD-10-CM

## 2018-04-12 DIAGNOSIS — M545 Low back pain: Secondary | ICD-10-CM

## 2018-04-12 DIAGNOSIS — R278 Other lack of coordination: Secondary | ICD-10-CM

## 2018-04-12 DIAGNOSIS — G8929 Other chronic pain: Secondary | ICD-10-CM

## 2018-04-12 NOTE — Therapy (Signed)
[place] MAIN [facility] SERVICES [address] [place], [place], [address] Phone: [phone]   Fax:  [phone]  Physical Therapy Treatment  Patient Details  Name: Barbara Gates MRN: [id number] Date of Birth: [birth year] Referring Provider: Josephina Gip PA   Encounter Date: 04/12/2018  PT End of Session - 04/13/18 1014    Visit Number  14    Number of Visits  20    Date for PT Re-Evaluation  05/18/18    Authorization Type  medicare    Authorization Time Period  05/18/2018    Authorization - Visit Number  2    Authorization - Number of Visits  8    PT Start Time  1011    PT Stop Time  1111    PT Time Calculation (min)  60 min    Activity Tolerance  Patient tolerated treatment well    Behavior During Therapy  [facility] for tasks assessed/performed       Past Medical History:  Diagnosis Date  . Hypercalcemia   . Hyperlipidemia    Has history of this  . Osteoporosis     Past Surgical History:  Procedure Laterality Date  . ABDOMINAL HYSTERECTOMY  1994   Total including ovarie. Due to endometriosis  . BREAST BIOPSY Left 11/08/2012   Dr. Bary Castilla, neg  . Surgical Lipoma Removal; 12/13/2012  12/13/2012    There were no vitals filed for this visit.    Pelvic Floor Physical Therapy Treatment Note  SCREENING  Changes in medications, allergies, or medical history?: no     SUBJECTIVE  Patient reports: Feels that the TENS makes a significant difference and has less formed stool when not using it. Felt some pain at the R PSIS when standing still too long talking to the neighbor the other day.  Pain update:  Location of pain: L shoulder, R PSIS Current pain:  0/10  Max pain:  2/10 Least pain:  0/10 Nature of pain: grinding  Patient Goals: Decrease constipation. Maintaining bladder improvement.     OBJECTIVE  Changes in:  Range of Motion/Flexibilty:  Rib mobility through upper third  Restricted and painful,  improved following treatment. Thoracic spine mobility greatly improved following PA and rib mobilizations.    INTERVENTIONS THIS SESSION: Manual: Performed PA grade 3-4 mobs to remaining ribs on R side and through thoracic spine to further improve neural mobility and allow for improved transmission to the colon and pelvic floor muscles. Self-care: Educated patient on how to operate new e-stim unit and settings to continue to make sure that she continues to have improved motility.   Total time: 60 min.                         PT Education - 04/13/18 1013    Education provided  Yes    Education Details  See Interventions this session    Person(s) Educated  Patient    Methods  Explanation;Demonstration;Verbal cues    Comprehension  Verbalized understanding;Returned demonstration          PT Long Term Goals - 03/23/18 2124      PT LONG TERM GOAL #1   Title  Pt will be able to eliminate bowel movements of consistency of Type 4-5 across 75% of time instead of Type 6-7 in order to minimize straining    Baseline  Pt. is demonstrating BM's between 5-6 at least 75% of the time but has not yet achieved grade  4     Time  10    Period  Weeks    Status  On-going    Target Date  05/18/18      PT LONG TERM GOAL #2   Title  Pt will able to decrease COREFO score from 20% to < 15% in order to improve GI function    Time  12    Period  Weeks    Status  Unable to assess    Target Date  05/18/18      PT LONG TERM GOAL #3   Title  Pt will report decreased LBP / R hip pain with standing from 10 min ( VAS level 5/10) to >30 min ( < 2/10 ) in order to particiapte in community events  and to volunteer at [employer]    Time  8    Period  Weeks    Status  Achieved    Target Date  02/14/18      PT LONG TERM GOAL #4   Title  Pt will dem odecreased abdominal separation from 2 fingers width along linea alba to < 1 fingers width in order to promote motility and postural stability      Time  4    Period  Weeks    Status  Achieved    Target Date  12/20/17      PT LONG TERM GOAL #5   Title  Pt will demo no pelvic obliquities and report no increased pain at L rib that presents prior to elimination of bowel movements in order to improve QOL     Time  2    Period  Weeks    Status  Achieved    Target Date  12/07/17      PT LONG TERM GOAL #6   Title  Pt will increase her hip abduction strength from 3/5 B to > 4/5  in order to climb stairs at home with minimize risk for injuries     Time  10    Period  Weeks    Status  On-going    Target Date  05/18/18      PT LONG TERM GOAL #7   Title  Pt will report being able to not leak prior to making it to the bathroom in order to travel    Time  12    Period  Weeks    Status  Achieved    Target Date  02/15/18      PT LONG TERM GOAL #8   Title  Pt will demo no pelvic floor tightness across 2 visits in order promote pelvic floor ROM for better function    Baseline  Pt. demonstrates significantly less PFM tightness with only 2-3 areas with moderate spasm compared to all muscles in high state of spasm and pain.    Time  6    Period  Weeks    Status  On-going    Target Date  05/18/18            Plan - 04/13/18 1015    Clinical Impression Statement  Pt. Responded well to all interventions today, demonstrating improved toracic mobility and understanding of all education provided. Continue per POC.    Clinical Presentation  Stable    Clinical Decision Making  High    Rehab Potential  Good    PT Frequency  1x / week    PT Duration  8 weeks    PT Treatment/Interventions  Neuromuscular re-education;Therapeutic activities;Therapeutic exercise;Moist  Heat;Patient/family education;Manual techniques;Functional mobility training;Stair training;Biofeedback    PT Next Visit Plan  sacral mobility, TP release to paraspinals and neck/upper traps, re-check psoas and QL    PT Home Exercise Plan  TA in quadruped, urge suppression,  sit-to-stand, 3-way wall stretch, squatty potty, Bow-and-arrow, side-stretch    Consulted and Agree with Plan of Care  Patient       Patient will benefit from skilled therapeutic intervention in order to improve the following deficits and impairments:  Decreased safety awareness, Decreased coordination, Decreased knowledge of use of DME, Decreased strength, Increased fascial restricitons, Decreased scar mobility, Decreased range of motion, Decreased cognition, Decreased mobility, Decreased endurance, Decreased balance, Increased muscle spasms, Postural dysfunction, Pain, Improper body mechanics  Visit Diagnosis: Sacrococcygeal disorders, not elsewhere classified  Other lack of coordination  Chronic right-sided low back pain, with sciatica presence unspecified     Problem List Patient Active Problem List   Diagnosis Date Noted  . Chronic pain of both shoulders 04/05/2018  . Hypercalcemia 12/30/2015  . Memory impairment of gradual onset 12/30/2015  . Abnormal blood chemistry 06/11/2015  . Adult idiopathic generalized osteoporosis 06/11/2015  . Colon polyp 06/11/2015  . Closed fracture of proximal end of radius 06/11/2015  . Auditory impairment 06/11/2015  . Blood in the urine 06/11/2015  . Hypercholesteremia 06/11/2015  . Gonalgia 06/11/2015  . Herpes zona 06/11/2015  . Female genuine stress incontinence 06/11/2015  . Chronic constipation 09/02/2014  . Lipoma of back-left 07/18/2012   Willa Rough DPT, ATC Willa Rough 04/13/2018, 10:17 AM  [place] MAIN [facility] SERVICES [address] [place], [place], [address] Phone: [phone]   Fax:  [phone]  Name: Leva Baine MRN: [id number] Date of Birth: 1951-09-22

## 2018-04-26 ENCOUNTER — Ambulatory Visit: Payer: Medicare Other | Attending: Gastroenterology

## 2018-04-26 DIAGNOSIS — M545 Low back pain: Secondary | ICD-10-CM | POA: Insufficient documentation

## 2018-04-26 DIAGNOSIS — R278 Other lack of coordination: Secondary | ICD-10-CM

## 2018-04-26 DIAGNOSIS — M533 Sacrococcygeal disorders, not elsewhere classified: Secondary | ICD-10-CM | POA: Diagnosis not present

## 2018-04-26 DIAGNOSIS — G8929 Other chronic pain: Secondary | ICD-10-CM | POA: Diagnosis not present

## 2018-04-26 NOTE — Therapy (Signed)
[place] MAIN [facility] SERVICES [address] [place], [place], [address] Phone: [phone]   Fax:  [phone]  Physical Therapy Treatment  Patient Details  Name: Barbara Gates MRN: [id number] Date of Birth: May 05, 1951 Referring Provider: Josephina Gip PA   Encounter Date: 04/26/2018    Past Medical History:  Diagnosis Date  . Hypercalcemia   . Hyperlipidemia    Has history of this  . Osteoporosis     Past Surgical History:  Procedure Laterality Date  . ABDOMINAL HYSTERECTOMY  1994   Total including ovarie. Due to endometriosis  . BREAST BIOPSY Left 11/08/2012   Dr. Bary Castilla, neg  . Surgical Lipoma Removal; 12/13/2012  12/13/2012    There were no vitals filed for this visit.    Pelvic Floor Physical Therapy Treatment Note  SCREENING  Changes in medications, allergies, or medical history?: no   SUBJECTIVE  Patient reports: She went 2.5 days before having a BM but was able to this morning without taking and extra measures.  Pain update:  Location of pain: L shoulder and Current pain:  0/10  Max pain:  1/10 Least pain:  0/10 Nature of pain: "discomfort in L shoulder and in abdomen when not having BM fro 2 days.   Patient Goals: Decrease constipation. Maintaining bladder improvement.    OBJECTIVE  Changes in: Posture/Observations:  Forward head and shoulders  Range of Motion/Flexibilty:  Decreased L ER by ~ 30 degrees prir to treatment, ~ 15 degrees after.  Palpation: TTP with TPs through infraspinatus, supraspinatus, Upper trapezius and pectoralis muscles. reduced tenderness and TPs following treatment.  INTERVENTIONS THIS SESSION: Manual: assessed tissue in the L shoulder and provided STM to pectoralis muscle, upper trap, infra and supraspinatus on L  Dry needling: Performed standard technique of Dn to  infraspinatus, supraspinatus, Upper trapezius and pectoralis muscles to decrease spasm, improve  ROM of the L shoulder, and to allow Pt. Increased ROM and decreased pain so she can perform her HEP appropriately to improve posture and subsequently decrease constipation.  Total time: 60 min.                          PT Long Term Goals - 03/23/18 2124      PT LONG TERM GOAL #1   Title  Pt will be able to eliminate bowel movements of consistency of Type 4-5 across 75% of time instead of Type 6-7 in order to minimize straining    Baseline  Pt. is demonstrating BM's between 5-6 at least 75% of the time but has not yet achieved grade 4     Time  10    Period  Weeks    Status  On-going    Target Date  05/18/18      PT LONG TERM GOAL #2   Title  Pt will able to decrease COREFO score from 20% to < 15% in order to improve GI function    Time  12    Period  Weeks    Status  Unable to assess    Target Date  05/18/18      PT LONG TERM GOAL #3   Title  Pt will report decreased LBP / R hip pain with standing from 10 min ( VAS level 5/10) to >30 min ( < 2/10 ) in order to particiapte in community events  and to volunteer at [employer]    Time  8    Period  Weeks    Status  Achieved    Target Date  02/14/18      PT LONG TERM GOAL #4   Title  Pt will dem odecreased abdominal separation from 2 fingers width along linea alba to < 1 fingers width in order to promote motility and postural stability     Time  4    Period  Weeks    Status  Achieved    Target Date  12/20/17      PT LONG TERM GOAL #5   Title  Pt will demo no pelvic obliquities and report no increased pain at L rib that presents prior to elimination of bowel movements in order to improve QOL     Time  2    Period  Weeks    Status  Achieved    Target Date  12/07/17      PT LONG TERM GOAL #6   Title  Pt will increase her hip abduction strength from 3/5 B to > 4/5  in order to climb stairs at home with minimize risk for injuries     Time  10    Period  Weeks    Status  On-going    Target Date  05/18/18       PT LONG TERM GOAL #7   Title  Pt will report being able to not leak prior to making it to the bathroom in order to travel    Time  12    Period  Weeks    Status  Achieved    Target Date  02/15/18      PT LONG TERM GOAL #8   Title  Pt will demo no pelvic floor tightness across 2 visits in order promote pelvic floor ROM for better function    Baseline  Pt. demonstrates significantly less PFM tightness with only 2-3 areas with moderate spasm compared to all muscles in high state of spasm and pain.    Time  6    Period  Weeks    Status  On-going    Target Date  05/18/18              Patient will benefit from skilled therapeutic intervention in order to improve the following deficits and impairments:     Visit Diagnosis: No diagnosis found.     Problem List Patient Active Problem List   Diagnosis Date Noted  . Chronic pain of both shoulders 04/05/2018  . Hypercalcemia 12/30/2015  . Memory impairment of gradual onset 12/30/2015  . Abnormal blood chemistry 06/11/2015  . Adult idiopathic generalized osteoporosis 06/11/2015  . Colon polyp 06/11/2015  . Closed fracture of proximal end of radius 06/11/2015  . Auditory impairment 06/11/2015  . Blood in the urine 06/11/2015  . Hypercholesteremia 06/11/2015  . Gonalgia 06/11/2015  . Herpes zona 06/11/2015  . Female genuine stress incontinence 06/11/2015  . Chronic constipation 09/02/2014  . Lipoma of back-left 07/18/2012   Willa Rough DPT, ATC Willa Rough 04/26/2018, 1:33 PM  [place] MAIN [facility] SERVICES [address] [place], [place], [address] Phone: [phone]   Fax:  [phone]  Name: Barbara Gates MRN: [id number] Date of Birth: 06-30-1951

## 2018-05-08 ENCOUNTER — Ambulatory Visit: Payer: Medicare Other

## 2018-05-08 DIAGNOSIS — M533 Sacrococcygeal disorders, not elsewhere classified: Secondary | ICD-10-CM | POA: Diagnosis not present

## 2018-05-08 DIAGNOSIS — G8929 Other chronic pain: Secondary | ICD-10-CM | POA: Diagnosis not present

## 2018-05-08 DIAGNOSIS — M545 Low back pain: Secondary | ICD-10-CM | POA: Diagnosis not present

## 2018-05-08 DIAGNOSIS — R278 Other lack of coordination: Secondary | ICD-10-CM | POA: Diagnosis not present

## 2018-05-08 NOTE — Therapy (Signed)
[place] MAIN [facility] SERVICES [address] [place], [place], [address] Phone: [phone]   Fax:  [phone]  Physical Therapy Treatment  Patient Details  Name: Barbara Gates MRN: [id number] Date of Birth: October 12, 1951 Referring Provider: Josephina Gip PA   Encounter Date: 05/08/2018  PT End of Session - 05/08/18 1456    Visit Number  16    Number of Visits  20    Date for PT Re-Evaluation  05/18/18    Authorization Type  medicare    Authorization Time Period  05/18/2018    Authorization - Visit Number  4    Authorization - Number of Visits  8    PT Start Time  1300    PT Stop Time  1430    PT Time Calculation (min)  90 min    Activity Tolerance  Patient tolerated treatment well    Behavior During Therapy  [facility] for tasks assessed/performed       Past Medical History:  Diagnosis Date  . Hypercalcemia   . Hyperlipidemia    Has history of this  . Osteoporosis     Past Surgical History:  Procedure Laterality Date  . ABDOMINAL HYSTERECTOMY  1994   Total including ovarie. Due to endometriosis  . BREAST BIOPSY Left 11/08/2012   Dr. Bary Castilla, neg  . Surgical Lipoma Removal; 12/13/2012  12/13/2012    There were no vitals filed for this visit.    Pelvic Floor Physical Therapy Treatment Note  SCREENING  Changes in medications, allergies, or medical history?: no    SUBJECTIVE  Patient reports: Forgot to take her meds one day and then was backed up for 4 days until taking extra magnesium and getting causght up, has still noticed some "creaking in the shoulder.   Pain update:   Location of pain: L shoulder and Current pain: 0/10  Max pain: 1/10 Least pain: 0/10 Nature of pain:"creaking" when doing aggravating motions.    Patient Goals: Decrease constipation. Maintaining bladder improvement.    OBJECTIVE  Changes in: Posture/Observations:  Continues to demonstrate anterior pelvic tilt. Pt. Describes  confusion today about what is meant by tuck "under" she had reversed directions in her mind.   Range of Motion/Flexibilty:  Decreased abduction and scapular outward rotation.   *Improved following treatment.  Palpation: TTP through L sub-scap, B iliacus, and psoas   INTERVENTIONS THIS SESSION: Manual: Performed TP release to L sub-scap and B iliacus and Psoas muscles to decrease tension on nerve roots directly and via improved posture.  Therex: educated on and practiced hip flexor stretch, chin-tucks, scapular retractions and rows, mini-marches,and educated on hip extension in either lying or standing, 3-way wall stretch (and alternates), and chest opening stretches to improve posture for reduced pressure on nerve roots for improved bowel function and maintain bladder symptom improvements. Self-care: educated Pt. On self-tp release tools and how to perform to continue to maintain decreased spasms and allow for strengthening to maintain symptom reduction and continue to provide improvement. Educated on making the most out of her Tai-chi rather than just "going through the motions. Dry needling: Attempted DN with standard approach anteriorly and posteriorly without success.  Total time: 90 min.                          PT Education - 05/08/18 1456    Education provided  Yes    Education Details  See Pt. Instructions and Interventions this session.  Person(s) Educated  Patient    Methods  Explanation;Demonstration;Verbal cues;Handout;Tactile cues    Comprehension  Verbalized understanding;Returned demonstration;Verbal cues required;Tactile cues required;Need further instruction          PT Long Term Goals - 03/23/18 2124      PT LONG TERM GOAL #1   Title  Pt will be able to eliminate bowel movements of consistency of Type 4-5 across 75% of time instead of Type 6-7 in order to minimize straining    Baseline  Pt. is demonstrating BM's between 5-6 at least 75%  of the time but has not yet achieved grade 4     Time  10    Period  Weeks    Status  On-going    Target Date  05/18/18      PT LONG TERM GOAL #2   Title  Pt will able to decrease COREFO score from 20% to < 15% in order to improve GI function    Time  12    Period  Weeks    Status  Unable to assess    Target Date  05/18/18      PT LONG TERM GOAL #3   Title  Pt will report decreased LBP / R hip pain with standing from 10 min ( VAS level 5/10) to >30 min ( < 2/10 ) in order to particiapte in community events  and to volunteer at [employer]    Time  8    Period  Weeks    Status  Achieved    Target Date  02/14/18      PT LONG TERM GOAL #4   Title  Pt will dem odecreased abdominal separation from 2 fingers width along linea alba to < 1 fingers width in order to promote motility and postural stability     Time  4    Period  Weeks    Status  Achieved    Target Date  12/20/17      PT LONG TERM GOAL #5   Title  Pt will demo no pelvic obliquities and report no increased pain at L rib that presents prior to elimination of bowel movements in order to improve QOL     Time  2    Period  Weeks    Status  Achieved    Target Date  12/07/17      PT LONG TERM GOAL #6   Title  Pt will increase her hip abduction strength from 3/5 B to > 4/5  in order to climb stairs at home with minimize risk for injuries     Time  10    Period  Weeks    Status  On-going    Target Date  05/18/18      PT LONG TERM GOAL #7   Title  Pt will report being able to not leak prior to making it to the bathroom in order to travel    Time  12    Period  Weeks    Status  Achieved    Target Date  02/15/18      PT LONG TERM GOAL #8   Title  Pt will demo no pelvic floor tightness across 2 visits in order promote pelvic floor ROM for better function    Baseline  Pt. demonstrates significantly less PFM tightness with only 2-3 areas with moderate spasm compared to all muscles in high state of spasm and pain.    Time  6     Period  Weeks    Status  On-going    Target Date  05/18/18            Plan - 05/09/18 1156    Clinical Impression Statement  Pt. responded well to all interventions today though no twitch was elicited with DN so we reverted to manual TP release. She should demonstrate improved carry-over with use of videos at home for exercise performance rather than pictures since she described confusion about which way her pelvis should be tilting, this may explain that she has not seen faster progress. Continue per POC.    Clinical Presentation  Stable    Clinical Decision Making  High    Rehab Potential  Good    PT Frequency  1x / week    PT Duration  8 weeks    PT Treatment/Interventions  Neuromuscular re-education;Therapeutic activities;Therapeutic exercise;Moist Heat;Patient/family education;Manual techniques;Functional mobility training;Stair training;Biofeedback    PT Next Visit Plan  Re-assess for potential D/C? Dry needling to thoracic multifidus sacral mobility? re-check psoas and QL    PT Home Exercise Plan  TA in quadruped, urge suppression, sit-to-stand, 3-way wall stretch, squatty potty, Bow-and-arrow, side-stretch see [employer]    Consulted and Agree with Plan of Care  Patient       Patient will benefit from skilled therapeutic intervention in order to improve the following deficits and impairments:  Decreased safety awareness, Decreased coordination, Decreased knowledge of use of DME, Decreased strength, Increased fascial restricitons, Decreased scar mobility, Decreased range of motion, Decreased cognition, Decreased mobility, Decreased endurance, Decreased balance, Increased muscle spasms, Postural dysfunction, Pain, Improper body mechanics  Visit Diagnosis: Sacrococcygeal disorders, not elsewhere classified  Other lack of coordination  Chronic right-sided low back pain, with sciatica presence unspecified     Problem List Patient Active Problem List   Diagnosis Date Noted   . Chronic pain of both shoulders 04/05/2018  . Hypercalcemia 12/30/2015  . Memory impairment of gradual onset 12/30/2015  . Abnormal blood chemistry 06/11/2015  . Adult idiopathic generalized osteoporosis 06/11/2015  . Colon polyp 06/11/2015  . Closed fracture of proximal end of radius 06/11/2015  . Auditory impairment 06/11/2015  . Blood in the urine 06/11/2015  . Hypercholesteremia 06/11/2015  . Gonalgia 06/11/2015  . Herpes zona 06/11/2015  . Female genuine stress incontinence 06/11/2015  . Chronic constipation 09/02/2014  . Lipoma of back-left 07/18/2012   Willa Rough DPT, ATC Willa Rough 05/09/2018, 12:02 PM  [place] MAIN [facility] SERVICES [address] [place], [place], [address] Phone: [phone]   Fax:  [phone]  Name: Meleni Delahunt MRN: [id number] Date of Birth: [birth year]

## 2018-05-08 NOTE — Patient Instructions (Addendum)
For trigger-point release. Put pressure on the tender spot and hold for at least 30 seconds or until the tenderness is at least 50% reduced. Take deep belly breaths the whole time.   Hip Abduction (Standing)    Stand with support. Squeeze pelvic floor and hold. Lift right leg out to side, keeping toe forward. Hold for _2__ seconds. Relax for _1__ seconds. Repeat __10_ times. Do _3__ sets  a day.  HIP Extension - Standing    Draw lower tummy muscle (TA) and pelvic floor in, raise and lift leg backward squeezing glutes. Keep knee straight or slightly bent. _10__ reps per set, _3__ sets per day. Hold onto a support as lightly as safely possible.     Hip Abduction: Side Leg Lift - Side-Lying    Lie on side. Draw lower tummy muscle (TA) and pelvic floor in, Lift top leg until you feel strong contraction of muscle on the side of the hip. Keep top leg straight with body, toes pointing forward. Do not let your hip roll back! Do _10__ reps per set, __3_ sets per day  Hip Extension:    Lying face down, raise leg just off floor squeezing glutes. Keep leg straight. Hold 1 count. Lower leg to floor. Do _10__ reps per set, __3_ sets for each leg. Optional: Place small pillow under abdomen if back becomes uncomfortable.  Access Code: [email or web address]  URL: [email or web address]  Date: 05/08/2018  Prepared by: Letitia Libra   Program Notes  Do EITHER the top three exercises, OR the bottom three exercises at least once a day to help relax the muscles in and around the pelvic floor. Many people find it very relaxing to perform these shortly before bed.   Exercises  Seated Scapular Retraction - 10 reps - 3 sets - 2x daily - 7x weekly  Seated Cervical Retraction - 10 reps - 3 sets - 2x daily - 7x weekly  Supine Chin Tuck - 10 reps - 3 sets - 1x daily - 7x weekly  Half Kneeling Hip Flexor Stretch - 3 reps - 5 breaths hold - 1x daily - 7x weekly  Seated Scapular Retraction with  Resistance - 10 reps - 3 sets - 1x daily - 7x weekly  Doorway Pec Stretch at 90 Degrees Abduction - 3 sets - 5 Breaths Hold - 1x daily - 7x weekly  Doorway Pec Stretch at 60 Degrees Abduction - 3 reps - 5 breaths Hold - 1x daily - 7x weekly  Supine Static Chest Stretch on Foam Roll - 3 reps - 5 breath hold - 1x daily - 7x weekly  Thoracic Mobilization on Foam Roll - Hands Clasped - 15 reps - 3 sets - 1x daily - 7x weekly  Supine March - 10 reps - 3 sets - 1x daily - 7x weekly  Standing Hip Abduction with Counter Support - 10 reps - 3 sets - 1x daily - 7x weekly  Standing Hip Extension - 10 reps - 3 sets - 1x daily - 7x weekly  Seated Pelvic Tilt - 10 reps - 3 sets - 1x daily - 7x weekly  Seated Alternating Side Stretch with Arm Overhead - 10 reps - 3 sets - 1x daily - 7x weekly  Double Leg Hamstring Stretch at Wall - 3 reps - 5 Breaths hold - 1x daily  Sidelying Thoracic and Shoulder Rotation - 10 reps - 3 sets - 1x daily - 7x weekly  Stretching Adductors - 3 reps - 5 Breaths Hold -  1x daily  Supine Pelvic Floor Stretch - 3 reps - 5 seconds Hold - 1x daily  V Sit Hip Adductor Hamstring Stretch - 3 reps - 5 breaths Hold - 1x daily  Quadruped Adductor Stretch - 3 reps - 5 Breaths Hold - 1x daily  Diaphragmatic Breathing in Child's Pose with Pelvic Floor Relaxation - 3 reps - 5 breaths Hold - 1x daily

## 2018-05-14 ENCOUNTER — Ambulatory Visit: Payer: Medicare Other

## 2018-05-14 DIAGNOSIS — M533 Sacrococcygeal disorders, not elsewhere classified: Secondary | ICD-10-CM | POA: Diagnosis not present

## 2018-05-14 DIAGNOSIS — M545 Low back pain: Secondary | ICD-10-CM | POA: Diagnosis not present

## 2018-05-14 DIAGNOSIS — G8929 Other chronic pain: Secondary | ICD-10-CM | POA: Diagnosis not present

## 2018-05-14 DIAGNOSIS — R278 Other lack of coordination: Secondary | ICD-10-CM

## 2018-05-14 NOTE — Therapy (Signed)
[place] MAIN [facility] SERVICES [address] [place], [place], [address] Phone: [phone]   Fax:  [phone]  Physical Therapy Treatment and D/C summary  Patient Details  Name: Barbara Gates MRN: [id number] Date of Birth: [birth year] Referring Provider: Josephina Gip PA   Encounter Date: 05/14/2018  PT End of Session - 05/16/18 0947    Visit Number  17    Number of Visits  20    Date for PT Re-Evaluation  05/18/18    Authorization Type  medicare    Authorization Time Period  05/18/2018    Authorization - Visit Number  5    Authorization - Number of Visits  8    PT Start Time  1400    PT Stop Time  1500    PT Time Calculation (min)  60 min    Activity Tolerance  Patient tolerated treatment well    Behavior During Therapy  [facility] for tasks assessed/performed       Past Medical History:  Diagnosis Date  . Hypercalcemia   . Hyperlipidemia    Has history of this  . Osteoporosis     Past Surgical History:  Procedure Laterality Date  . ABDOMINAL HYSTERECTOMY  1994   Total including ovarie. Due to endometriosis  . BREAST BIOPSY Left 11/08/2012   Dr. Bary Castilla, neg  . Surgical Lipoma Removal; 12/13/2012  12/13/2012    There were no vitals filed for this visit.    Pelvic Floor Physical Therapy Treatment Note  SCREENING  Changes in medications, allergies, or medical history?: Has a new probable tick bite.    SUBJECTIVE  Patient reports: She has recently noticed blood in her underwear after a BM, believes she has a fissure. She has not been using the stim  But has found that if she changes medication at all that she has a set back in constipation.   Pain update: no pain today, some soreness still around L scapula from release and "popping" remains.  Patient Goals: Decrease constipation. Maintaining bladder improvement.    OBJECTIVE  Changes in: Posture/Observations:  ~ 50% improved posture, with decreased  kyphotic and lordotic curves but some persistent.   Range of Motion/Flexibilty:  50% improvement in ROM  Pelvic floor: Coordinated and without TP's vaginally, Pt. Declines rectal exam.  Palpation: No TTP through Abdomen or around L shoulder  INTERVENTIONS THIS SESSION: Self-care: reviewed the importance of continuing to be diligent with exercises to improve posture and reduce pressure on nerve roots to allow for decreased PFM tension. Reviewed how stress management and deep breathing will help her continue to see decreased PFM tightness and improve motility. Re-emphasized how the bowel retraining program only works if she allows herself time to relax and have a BM before giving up. Therex: reviewed HEP for form and correct performance to allow Pt. To continue to make improvement upon D/C.  Total time: 60 min.                          PT Education - 05/16/18 0945    Education provided  Yes    Education Details  See Interventions this session    Person(s) Educated  Patient    Methods  Explanation;Demonstration;Tactile cues;Verbal cues;Handout    Comprehension  Verbalized understanding;Returned demonstration;Verbal cues required;Tactile cues required          PT Long Term Goals - 05/16/18 0953      PT LONG TERM GOAL #1  Title  Pt will be able to eliminate bowel movements of consistency of Type 4-5 across 75% of time instead of Type 6-7 in order to minimize straining    Baseline  Pt. is demonstrating BM's between 5-6 at least 75% of the time but has not yet achieved grade 4     Time  10    Period  Weeks    Status  Not Met    Target Date  05/18/18      PT LONG TERM GOAL #2   Title  Pt will able to decrease COREFO score from 20% to < 15% in order to improve GI function    Baseline  10%    Time  12    Period  Weeks    Status  Achieved    Target Date  05/18/18      PT LONG TERM GOAL #3   Title  Pt will report decreased LBP / R hip pain with standing  from 10 min ( VAS level 5/10) to >30 min ( < 2/10 ) in order to particiapte in community events  and to volunteer at [employer]    Time  8    Period  Weeks    Status  Achieved    Target Date  02/14/18      PT LONG TERM GOAL #4   Title  Pt will dem odecreased abdominal separation from 2 fingers width along linea alba to < 1 fingers width in order to promote motility and postural stability     Time  4    Period  Weeks    Status  Achieved    Target Date  12/20/17      PT LONG TERM GOAL #5   Title  Pt will demo no pelvic obliquities and report no increased pain at L rib that presents prior to elimination of bowel movements in order to improve QOL     Time  2    Period  Weeks    Status  Achieved    Target Date  12/07/17      PT LONG TERM GOAL #6   Title  Pt will increase her hip abduction strength from 3/5 B to > 4/5  in order to climb stairs at home with minimize risk for injuries     Time  10    Period  Weeks    Status  Achieved    Target Date  05/18/18      PT LONG TERM GOAL #7   Title  Pt will report being able to not leak prior to making it to the bathroom in order to travel    Time  12    Period  Weeks    Status  Achieved    Target Date  02/15/18      PT LONG TERM GOAL #8   Title  Pt will demo no pelvic floor tightness across 2 visits in order promote pelvic floor ROM for better function    Baseline  Pt. demonstrates significantly less PFM tightness with only 2-3 areas with moderate spasm compared to all muscles in high state of spasm and pain.    Period  Weeks    Status  Achieved    Target Date  05/18/18            Plan - 05/16/18 0947    Clinical Impression Statement  Pt. has met all goals but one, the ability to remain regular without the aid of medication. Her lack  of success in this area is likely due to a genetic predisposition for constipation, her scoliosis, and her high anxiety. She does have improved motility with use of tens unit but is non-compliant. She  no longer demonstrates PFM tightness/spasm and understands what she can do at home to continue to improve her posture to potentially take more pressure off of nerve roots and allow for continued improvement of motility. At this time the patient will be D/C to work on her HEP at home for continued improvement and maintenence of decreased pain and urinary incontinence.    Clinical Presentation  Stable    Clinical Decision Making  High    Rehab Potential  Good    PT Frequency  1x / week    PT Duration  8 weeks    PT Treatment/Interventions  Neuromuscular re-education;Therapeutic activities;Therapeutic exercise;Moist Heat;Patient/family education;Manual techniques;Functional mobility training;Stair training;Biofeedback    PT Next Visit Plan  D/C    PT Home Exercise Plan  TA in quadruped, urge suppression, sit-to-stand, 3-way wall stretch, squatty potty, [place], side-stretch see Med[employer]    Consulted and Agree with Plan of Care  Patient       Patient will benefit from skilled therapeutic intervention in order to improve the following deficits and impairments:  Decreased safety awareness, Decreased coordination, Decreased knowledge of use of DME, Decreased strength, Increased fascial restricitons, Decreased scar mobility, Decreased range of motion, Decreased cognition, Decreased mobility, Decreased endurance, Decreased balance, Increased muscle spasms, Postural dysfunction, Pain, Improper body mechanics  Visit Diagnosis: Sacrococcygeal disorders, not elsewhere classified  Other lack of coordination  Chronic right-sided low back pain, with sciatica presence unspecified     Problem List Patient Active Problem List   Diagnosis Date Noted  . Chronic pain of both shoulders 04/05/2018  . Hypercalcemia 12/30/2015  . Memory impairment of gradual onset 12/30/2015  . Abnormal blood chemistry 06/11/2015  . Adult idiopathic generalized osteoporosis 06/11/2015  . Colon polyp 06/11/2015  .  Closed fracture of proximal end of radius 06/11/2015  . Auditory impairment 06/11/2015  . Blood in the urine 06/11/2015  . Hypercholesteremia 06/11/2015  . Gonalgia 06/11/2015  . Herpes zona 06/11/2015  . Female genuine stress incontinence 06/11/2015  . Chronic constipation 09/02/2014  . Lipoma of back-left 07/18/2012   Willa Rough DPT, ATC Willa Rough 05/16/2018, 9:55 AM  [place] MAIN [facility] SERVICES [address] [place], [place], [address] Phone: [phone]   Fax:  [phone]  Name: Barbara Gates MRN: [id number] Date of Birth: January 26, 1951

## 2018-06-16 ENCOUNTER — Other Ambulatory Visit: Payer: Self-pay | Admitting: Physician Assistant

## 2018-06-16 DIAGNOSIS — M810 Age-related osteoporosis without current pathological fracture: Secondary | ICD-10-CM

## 2018-07-05 ENCOUNTER — Ambulatory Visit (INDEPENDENT_AMBULATORY_CARE_PROVIDER_SITE_OTHER): Payer: Medicare Other | Admitting: Physician Assistant

## 2018-07-05 ENCOUNTER — Encounter: Payer: Self-pay | Admitting: Physician Assistant

## 2018-07-05 VITALS — BP 122/70 | HR 56 | Temp 98.6°F | Resp 16 | Wt 141.0 lb

## 2018-07-05 DIAGNOSIS — N949 Unspecified condition associated with female genital organs and menstrual cycle: Secondary | ICD-10-CM | POA: Diagnosis not present

## 2018-07-05 NOTE — Progress Notes (Signed)
Patient: Barbara Gates Female    DOB: December 25, 1950   67 y.o.   MRN: [id number] Visit Date: 07/05/2018  Today's Provider: Mar Daring, PA-C   No chief complaint on file.  Subjective:    HPI Patient here today with c/o of cyst. Reports is in her private area. Not tender, no drainage, not hurting. No other symptoms. Noticed it yesterday. Reports she had some itching and then she felt a lump in her private area.      Allergies  Allergen Reactions  . Other     Mango food     Current Outpatient Medications:  .  aspirin 81 MG chewable tablet, Chew 81 mg by mouth. 4 times a week, Disp: , Rfl:  .  Calcium-Phosphorus-Vitamin D (CALCIUM GUMMIES PO), Take 250 mg by mouth 2 (two) times daily., Disp: , Rfl:  .  Carboxymethylcellul-Glycerin (LUBRICATING EYE DROPS OP), Apply to eye. , Disp: , Rfl:  .  cyanocobalamin 100 MCG tablet, Take by mouth daily., Disp: , Rfl:  .  docusate sodium (COLACE) 50 MG capsule, Take 50 mg by mouth 2 (two) times daily., Disp: , Rfl:  .  ibuprofen (ADVIL,MOTRIN) 200 MG tablet, Take 200 mg by mouth every 6 (six) hours as needed. , Disp: , Rfl:  .  L-Lysine HCl 500 MG TABS, Take by mouth., Disp: , Rfl:  .  Magnesium 400 MG CAPS, Take 250 mg by mouth 2 (two) times daily. , Disp: , Rfl:  .  Multiple Minerals-Vitamins (CALCIUM CITRATE-MAG-MINERALS PO), Take 250 mg by mouth daily., Disp: , Rfl:  .  Multiple Vitamin (MULTIVITAMIN) tablet, Take 1 tablet by mouth daily., Disp: , Rfl:  .  Multiple Vitamins-Minerals (EYE VITAMINS) CAPS, Take by mouth daily., Disp: , Rfl:  .  Polyethylene Glycol 3350 (MIRALAX PO), Take by mouth daily. , Disp: , Rfl:  .  raloxifene (EVISTA) 60 MG tablet, TAKE 1 TABLET BY MOUTH EVERY DAY, Disp: 90 tablet, Rfl: 1 .  Sennosides-Docusate Sodium (SENNA-DOCUSATE SODIUM PO), Take by mouth. , Disp: , Rfl:  .  TURMERIC PO, Take by mouth., Disp: , Rfl:   Review of Systems  Constitutional: Negative.   Respiratory: Negative.     Cardiovascular: Negative.   Genitourinary: Negative for dysuria, enuresis, genital sores, hematuria, menstrual problem, pelvic pain, vaginal bleeding, vaginal discharge and vaginal pain.       Nodule on left labia majora  Neurological: Negative.     Social History   Tobacco Use  . Smoking status: Never Smoker  . Smokeless tobacco: Never Used  Substance Use Topics  . Alcohol use: Yes    Alcohol/week: 1.0 standard drinks    Types: 1 Glasses of wine per week    Comment: margarita   Objective:   BP 122/70 (BP Location: Left Arm, Patient Position: Sitting, Cuff Size: Normal)   Pulse (!) 56   Temp 98.6 F (37 C) (Oral)   Resp 16   Wt 141 lb (64 kg)   SpO2 99%   BMI 20.82 kg/m  Vitals:   07/05/18 1543  BP: 122/70  Pulse: (!) 56  Resp: 16  Temp: 98.6 F (37 C)  TempSrc: Oral  SpO2: 99%  Weight: 141 lb (64 kg)     Physical Exam  Constitutional: She appears well-developed and well-nourished. No distress.  Neck: Normal range of motion. Neck supple.  Cardiovascular: Normal rate, regular rhythm and normal heart sounds. Exam reveals no gallop and no friction rub.  No  murmur heard. Pulmonary/Chest: Effort normal and breath sounds normal. No respiratory distress. She has no wheezes. She has no rales.  Abdominal: Hernia confirmed negative in the right inguinal area and confirmed negative in the left inguinal area.  Genitourinary:    Pelvic exam was performed with patient supine. There is no rash, tenderness, lesion or injury on the right labia. There is lesion on the left labia. There is no rash, tenderness or injury on the left labia.  Lymphadenopathy:    She has no cervical adenopathy. No inguinal adenopathy noted on the right side.  Skin: She is not diaphoretic.  Vitals reviewed.      Assessment & Plan:     1. Vaginal lump DDx: Sebaceous cyst vs lymphadenopathy. Will get Soft tissue US as below. Advised her to use warm compresses. I will f/u pending results.  Call if  symptoms worsen or change.  - [place] LT LOWER EXTREM LTD SOFT TISSUE NON VASCULAR; Future       Mar Daring, PA-C  [facility]

## 2018-07-05 NOTE — Patient Instructions (Signed)
Epidermal Cyst An epidermal cyst is sometimes called an epidermal inclusion cyst or an infundibular cyst. It is a sac made of skin tissue. The sac contains a substance called keratin. Keratin is a protein that is normally secreted through the hair follicles. When keratin becomes trapped in the top layer of skin (epidermis), it can form an epidermal cyst. Epidermal cysts are usually found on the face, neck, trunk, and genitals. These cysts are usually harmless (benign), and they may not cause symptoms unless they become infected. It is important not to pop epidermal cysts yourself. What are the causes? This condition may be caused by:  A blocked hair follicle.  A hair that curls and re-enters the skin instead of growing straight out of the skin (ingrown hair).  A blocked pore.  Irritated skin.  An injury to the skin.  Certain conditions that are passed along from parent to child (inherited).  Human papillomavirus (HPV).  What increases the risk? The following factors may make you more likely to develop an epidermal cyst:  Having acne.  Being overweight.  Wearing tight clothing.  What are the signs or symptoms? The only symptom of this condition may be a small, painless lump underneath the skin. When an epidermal cyst becomes infected, symptoms may include:  Redness.  Inflammation.  Tenderness.  Warmth.  Fever.  Keratin draining from the cyst. Keratin may look like a grayish-white, bad-smelling substance.  Pus draining from the cyst.  How is this diagnosed? This condition is diagnosed with a physical exam. In some cases, you may have a sample of tissue (biopsy) taken from your cyst to be examined under a microscope or tested for bacteria. You may be referred to a health care provider who specializes in skin care (dermatologist). How is this treated? In many cases, epidermal cysts go away on their own without treatment. If a cyst becomes infected, treatment may  include:  Opening and draining the cyst. After draining, minor surgery to remove the rest of the cyst may be done.  Antibiotic medicine to help prevent infection.  Injections of medicines (steroids) that help to reduce inflammation.  Surgery to remove the cyst. Surgery may be done if: ? The cyst becomes large. ? The cyst bothers you. ? There is a chance that the cyst could turn into cancer.  Follow these instructions at home:  Take over-the-counter and prescription medicines only as told by your health care provider.  If you were prescribed an antibiotic, use it as told by your health care provider. Do not stop using the antibiotic even if you start to feel better.  Keep the area around your cyst clean and dry.  Wear loose, dry clothing.  Do not try to pop your cyst.  Avoid touching your cyst.  Check your cyst every day for signs of infection.  Keep all follow-up visits as told by your health care provider. This is important. How is this prevented?  Wear clean, dry, clothing.  Avoid wearing tight clothing.  Keep your skin clean and dry. Shower or take baths every day.  Wash your body with a benzoyl peroxide wash when you shower or bathe. Contact a health care provider if:  Your cyst develops symptoms of infection.  Your condition is not improving or is getting worse.  You develop a cyst that looks different from other cysts you have had.  You have a fever. Get help right away if:  Redness spreads from the cyst into the surrounding area. This information is   not intended to replace advice given to you by your health care provider. Make sure you discuss any questions you have with your health care provider. Document Released: 10/08/2004 Document Revised: 07/06/2016 Document Reviewed: 09/09/2015 Elsevier Interactive Patient Education  2018 Elsevier Inc.  

## 2018-07-10 ENCOUNTER — Telehealth: Payer: Self-pay | Admitting: Physician Assistant

## 2018-07-10 NOTE — Telephone Encounter (Signed)
Pt called because she got stung this am by a wasp on her hand.  She washed it and put a cold cloth on it.  Took Claritin.  It is swollen pretty much but she is not having difficulty breathing or any other symptoms.  She said she has came in before for a wasp sting  and Mikki Santee told her she did everything she needed to do so she didn't come in this time.  She just wants to know if she needs to do anything else  CB#   [phone]  Thank s Con Memos

## 2018-07-10 NOTE — Telephone Encounter (Signed)
Pt.Advised. KW 

## 2018-07-10 NOTE — Telephone Encounter (Signed)
May also use oral Benadryl at bedtime and hydrocortisone cream if itching. Continue cold compresses today.

## 2018-07-11 ENCOUNTER — Ambulatory Visit (INDEPENDENT_AMBULATORY_CARE_PROVIDER_SITE_OTHER): Payer: Medicare Other | Admitting: Family Medicine

## 2018-07-11 ENCOUNTER — Other Ambulatory Visit: Payer: Self-pay | Admitting: Physician Assistant

## 2018-07-11 ENCOUNTER — Encounter: Payer: Self-pay | Admitting: Family Medicine

## 2018-07-11 ENCOUNTER — Telehealth: Payer: Self-pay | Admitting: Physician Assistant

## 2018-07-11 VITALS — BP 128/78 | HR 66 | Temp 98.3°F | Resp 16 | Wt 143.0 lb

## 2018-07-11 DIAGNOSIS — R590 Localized enlarged lymph nodes: Secondary | ICD-10-CM

## 2018-07-11 DIAGNOSIS — N949 Unspecified condition associated with female genital organs and menstrual cycle: Secondary | ICD-10-CM

## 2018-07-11 DIAGNOSIS — N7689 Other specified inflammation of vagina and vulva: Secondary | ICD-10-CM

## 2018-07-11 DIAGNOSIS — N764 Abscess of vulva: Secondary | ICD-10-CM

## 2018-07-11 DIAGNOSIS — T63464A Toxic effect of venom of wasps, undetermined, initial encounter: Secondary | ICD-10-CM

## 2018-07-11 DIAGNOSIS — N907 Vulvar cyst: Secondary | ICD-10-CM

## 2018-07-11 DIAGNOSIS — D281 Benign neoplasm of vagina: Secondary | ICD-10-CM

## 2018-07-11 DIAGNOSIS — N760 Acute vaginitis: Secondary | ICD-10-CM

## 2018-07-11 NOTE — Telephone Encounter (Signed)
Stephanie at [facility] out pt [facility] called regarding an order  sched for the patient tomorrow.  They had to add an additional pelvis order to the order you placed and need an ok to do that.  Their call back Is [phone] or [phone]  Thank s Con Memos

## 2018-07-11 NOTE — Telephone Encounter (Signed)
Yes this is ok and I think I have signed a verbal order already.

## 2018-07-11 NOTE — Progress Notes (Signed)
Patient: Barbara Gates Female    DOB: 08-03-1951   67 y.o.   MRN: [id number] Visit Date: 07/11/2018  Today's Provider: Lelon Huh, MD   Chief Complaint  Patient presents with  . Insect Bite   Subjective:    HPI Insect Bite:  Patient comes in reporting that she was stung by a wasp yesterday on her left hand. Today she woke up with swelling and redness and warmth of the right hand and lower arm. She complains of mild pain also. Patient has tried taking Claritin, Ibuprofen and Benadryl, and had tried soaking hand in cold water. She states it has improved since this morning. She is concerned about potential for anaphylactic reaction.    Allergies  Allergen Reactions  . Other     Mango food     Current Outpatient Medications:  .  aspirin 81 MG chewable tablet, Chew 81 mg by mouth. 4 times a week, Disp: , Rfl:  .  Calcium-Phosphorus-Vitamin D (CALCIUM GUMMIES PO), Take 250 mg by mouth 2 (two) times daily., Disp: , Rfl:  .  Carboxymethylcellul-Glycerin (LUBRICATING EYE DROPS OP), Apply to eye. , Disp: , Rfl:  .  cyanocobalamin 100 MCG tablet, Take by mouth daily., Disp: , Rfl:  .  docusate sodium (COLACE) 50 MG capsule, Take 50 mg by mouth 2 (two) times daily., Disp: , Rfl:  .  ibuprofen (ADVIL,MOTRIN) 200 MG tablet, Take 200 mg by mouth every 6 (six) hours as needed. , Disp: , Rfl:  .  L-Lysine HCl 500 MG TABS, Take by mouth., Disp: , Rfl:  .  Magnesium 400 MG CAPS, Take 250 mg by mouth 2 (two) times daily. , Disp: , Rfl:  .  Multiple Minerals-Vitamins (CALCIUM CITRATE-MAG-MINERALS PO), Take 250 mg by mouth daily., Disp: , Rfl:  .  Multiple Vitamin (MULTIVITAMIN) tablet, Take 1 tablet by mouth daily., Disp: , Rfl:  .  Multiple Vitamins-Minerals (EYE VITAMINS) CAPS, Take by mouth daily., Disp: , Rfl:  .  Polyethylene Glycol 3350 (MIRALAX PO), Take by mouth daily. , Disp: , Rfl:  .  raloxifene (EVISTA) 60 MG tablet, TAKE 1 TABLET BY MOUTH EVERY DAY, Disp: 90 tablet,  Rfl: 1 .  Sennosides-Docusate Sodium (SENNA-DOCUSATE SODIUM PO), Take by mouth. , Disp: , Rfl:  .  TURMERIC PO, Take by mouth., Disp: , Rfl:   Review of Systems  Constitutional: Negative for appetite change, chills, fatigue and fever.  Respiratory: Positive for shortness of breath. Negative for chest tightness.   Cardiovascular: Positive for palpitations.  Skin: Positive for color change and wound.       Swelling of the left hand  Neurological: Negative for dizziness.    Social History   Tobacco Use  . Smoking status: Never Smoker  . Smokeless tobacco: Never Used  Substance Use Topics  . Alcohol use: Yes    Alcohol/week: 1.0 standard drinks    Types: 1 Glasses of wine per week    Comment: margarita   Objective:   BP 128/78 (BP Location: Left Arm, Patient Position: Sitting, Cuff Size: Normal)   Pulse 66   Temp 98.3 F (36.8 C) (Oral)   Resp 16   Wt 143 lb (64.9 kg)   SpO2 95% Comment: room air  BMI 21.12 kg/m  Vitals:   07/11/18 1445  BP: 128/78  Pulse: 66  Resp: 16  Temp: 98.3 F (36.8 C)  TempSrc: Oral  SpO2: 95%  Weight: 143 lb (64.9 kg)  Physical Exam  General appearance: alert, well developed, well nourished, cooperative and in no distress Head: Normocephalic, without obvious abnormality, atraumatic Respiratory: Respirations even and unlabored, normal respiratory rate Extremities: small sting entry round dorsum of left proximal hand. Moderately swollen with some erythema extended from site of sting up forearm nearly to elbow.      Assessment & Plan:    . 1. Wasp sting, undetermined intent, initial encounter She reports she has had many similar stings in the past with similar sx, but seem to get worse every time. Is much improved from this morning and expect to resolve as previous stings have. Discussed s/s of infection and anaphylaxis to look out for, although current symptoms are no c/w anaphylaxis. Offered prescription for Epipen to be available for  future events which she declined. Advised to always keep benadryl close at hand and to call if she develops any sx of anaphylaxis.        Lelon Huh, MD  [facility]

## 2018-07-12 ENCOUNTER — Ambulatory Visit
Admission: RE | Admit: 2018-07-12 | Discharge: 2018-07-12 | Disposition: A | Discharge status: Home / Self Care | Payer: Medicare Other | Source: Ambulatory Visit | Attending: Physician Assistant | Admitting: Physician Assistant

## 2018-07-12 DIAGNOSIS — N909 Noninflammatory disorder of vulva and perineum, unspecified: Secondary | ICD-10-CM | POA: Insufficient documentation

## 2018-07-12 DIAGNOSIS — N9089 Other specified noninflammatory disorders of vulva and perineum: Secondary | ICD-10-CM | POA: Diagnosis not present

## 2018-07-12 DIAGNOSIS — R590 Localized enlarged lymph nodes: Secondary | ICD-10-CM | POA: Insufficient documentation

## 2018-07-12 DIAGNOSIS — N949 Unspecified condition associated with female genital organs and menstrual cycle: Secondary | ICD-10-CM | POA: Diagnosis present

## 2018-07-12 DIAGNOSIS — N907 Vulvar cyst: Secondary | ICD-10-CM | POA: Diagnosis not present

## 2018-07-12 DIAGNOSIS — D281 Benign neoplasm of vagina: Secondary | ICD-10-CM

## 2018-07-12 NOTE — Addendum Note (Signed)
Addended by: Mar Daring on: 07/12/2018 04:31 PM   Modules accepted: Orders

## 2018-07-13 ENCOUNTER — Telehealth: Payer: Self-pay | Admitting: Physician Assistant

## 2018-07-13 NOTE — Telephone Encounter (Signed)
Pt stated that she got a notice via MyChart that her [place] results were back. Pt stated that since she will be unavailable she is requesting that we leave a detailed message of the results on her voicemail. Please advise. Thanks TNP

## 2018-07-13 NOTE — Telephone Encounter (Signed)
LM as directed below.  Thanks,  -Aadhav Uhlig 

## 2018-07-19 ENCOUNTER — Encounter: Payer: Self-pay | Admitting: Obstetrics and Gynecology

## 2018-07-19 ENCOUNTER — Ambulatory Visit (INDEPENDENT_AMBULATORY_CARE_PROVIDER_SITE_OTHER): Payer: Medicare Other | Admitting: Obstetrics and Gynecology

## 2018-07-19 VITALS — BP 140/80 | HR 60 | Ht 69.0 in | Wt 141.0 lb

## 2018-07-19 DIAGNOSIS — M6790 Unspecified disorder of synovium and tendon, unspecified site: Secondary | ICD-10-CM | POA: Insufficient documentation

## 2018-07-19 DIAGNOSIS — M6788 Other specified disorders of synovium and tendon, other site: Secondary | ICD-10-CM | POA: Diagnosis not present

## 2018-07-19 DIAGNOSIS — N899 Noninflammatory disorder of vagina, unspecified: Secondary | ICD-10-CM

## 2018-07-19 NOTE — Patient Instructions (Signed)
I value your feedback and entrusting us with your care. If you get a [place] patient survey, I would appreciate you taking the time to let us know about your experience today. Thank you! 

## 2018-07-19 NOTE — Progress Notes (Signed)
Mar Daring, PA-C   Chief Complaint  Patient presents with  . Referral    [facility] referring for Vaginal lump externally (outer labia) x 2.5 weeks, no pain    HPI:      Ms. Barbara Gates is a 66 y.o. No obstetric history on file. who LMP was No LMP recorded. Patient has had a hysterectomy., presents today for NP eval of LT vag nodule. Pt saw PCP first and was referred here. Pt noticed nodule about 2 wks ago when scratching an itch. Never noticed it before. Not painful, no erythema, no d/c. No change since she first noticed it. No hx of bite/trauma. Pt is s/p hyst about 25 yrs ago for endometriosis/DUB. No vag bleeding/spotting. No vag sx. No fevers. PCP sent for pelvic u/s. Results below.  FH cancer on mat side.   EXAM: LIMITED ULTRASOUND OF PELVIS  TECHNIQUE: Limited transabdominal ultrasound examination of the pelvis was performed.  COMPARISON:  None.  FINDINGS: There is an irregular hypoechoic mass within the superficial soft tissues of the LEFT labia, corresponding to the area of palpable concern, measuring 1.1 x 0.6 x 0.7 cm. There is no tract seen to the skin to suggest sebaceous cyst.  There are no enlarged or morphologically abnormal lymph nodes seen within either groin region.  IMPRESSION: Irregular mass within the superficial soft tissues of the LEFT labia, measuring 1.1 cm, corresponding to the area of palpable concern. This mass is not suggestive of a sebaceous cyst. Neoplastic mass is not excluded. Tissue sampling and/or surgical excision recommended.  No lymphadenopathy within either groin region.   Electronically Signed   By: Franki Cabot M.D.   On: 07/12/2018 16:02   Past Medical History:  Diagnosis Date  . Hypercalcemia   . Hyperlipidemia    Has history of this  . Osteoporosis     Past Surgical History:  Procedure Laterality Date  . ABDOMINAL HYSTERECTOMY  1994   Total including ovarie. Due to endometriosis  .  BREAST BIOPSY Left 11/08/2012   Dr. Bary Castilla, neg  . Surgical Lipoma Removal; 12/13/2012  12/13/2012    Family History  Problem Relation Age of Onset  . Cancer Mother        lung, pancreas, melanoma, carcinoid tumors  . Hypertension Mother   . Hyperlipidemia Mother   . Diabetes Mother   . Hypertension Father   . Diabetes Father   . Hyperlipidemia Father   . Parkinson's disease Father   . CAD Father   . Diabetes Brother   . Hypertension Brother   . Breast cancer Sister 7  . Arthritis Sister   . Hypertension Sister     Social History   Socioeconomic History  . Marital status: Married    Spouse name: Not on file  . Number of children: Not on file  . Years of education: Not on file  . Highest education level: Not on file  Occupational History  . Not on file  Social Needs  . Financial resource strain: Not on file  . Food insecurity:    Worry: Not on file    Inability: Not on file  . Transportation needs:    Medical: Not on file    Non-medical: Not on file  Tobacco Use  . Smoking status: Never Smoker  . Smokeless tobacco: Never Used  Substance and Sexual Activity  . Alcohol use: Yes    Alcohol/week: 1.0 standard drinks    Types: 1 Glasses of wine per week  Comment: margarita  . Drug use: No  . Sexual activity: Not Currently    Birth control/protection: Surgical    Comment: Hysterecetomy  Lifestyle  . Physical activity:    Days per week: Not on file    Minutes per session: Not on file  . Stress: Not on file  Relationships  . Social connections:    Talks on phone: Not on file    Gets together: Not on file    Attends religious service: Not on file    Active member of club or organization: Not on file    Attends meetings of clubs or organizations: Not on file    Relationship status: Not on file  . Intimate partner violence:    Fear of current or ex partner: Not on file    Emotionally abused: Not on file    Physically abused: Not on file    Forced sexual  activity: Not on file  Other Topics Concern  . Not on file  Social History Narrative  . Not on file    Outpatient Medications Prior to Visit  Medication Sig Dispense Refill  . aspirin EC 81 MG tablet aspirin 81 mg tablet,delayed release  Take 1 tablet every day by oral route.    . Biotin 1000 MCG tablet     . Calcium-Phosphorus-Vitamin D (CALCIUM GUMMIES PO) Take 250 mg by mouth 2 (two) times daily.    . Carboxymethylcellul-Glycerin (LUBRICATING EYE DROPS OP) Apply to eye.     . cyanocobalamin 100 MCG tablet Take by mouth daily.    Marland Kitchen docusate sodium (COLACE) 50 MG capsule Take 50 mg by mouth 2 (two) times daily.    Marland Kitchen ibuprofen (ADVIL,MOTRIN) 200 MG tablet Take 200 mg by mouth every 6 (six) hours as needed.     Marland Kitchen L-Lysine HCl 500 MG TABS Take by mouth.    . Magnesium 400 MG CAPS Take 250 mg by mouth 2 (two) times daily.     . Multiple Minerals-Vitamins (CALCIUM CITRATE-MAG-MINERALS PO) Take 250 mg by mouth daily.    . Multiple Vitamin (MULTIVITAMIN) tablet Take 1 tablet by mouth daily.    . Multiple Vitamins-Minerals (EYE VITAMINS) CAPS Take by mouth daily.    . Polyethylene Glycol 3350 (MIRALAX PO) Take by mouth daily.     . raloxifene (EVISTA) 60 MG tablet TAKE 1 TABLET BY MOUTH EVERY DAY 90 tablet 1  . senna-docusate (SENOKOT-S) 8.6-50 MG tablet Take by mouth.    Orlie Dakin Sodium (SENNA-DOCUSATE SODIUM PO) Take by mouth.     . TURMERIC PO Take by mouth.    Marland Kitchen aspirin 81 MG chewable tablet Chew 81 mg by mouth. 4 times a week     No facility-administered medications prior to visit.     ROS:  Review of Systems  Constitutional: Negative for fatigue, fever and unexpected weight change.  Respiratory: Positive for cough. Negative for shortness of breath and wheezing.   Cardiovascular: Negative for chest pain, palpitations and leg swelling.  Gastrointestinal: Negative for blood in stool, constipation, diarrhea, nausea and vomiting.  Endocrine: Negative for cold  intolerance, heat intolerance and polyuria.  Genitourinary: Positive for frequency. Negative for dyspareunia, dysuria, flank pain, genital sores, hematuria, menstrual problem, pelvic pain, urgency, vaginal bleeding, vaginal discharge and vaginal pain.  Musculoskeletal: Negative for back pain, joint swelling and myalgias.  Skin: Negative for rash.  Neurological: Negative for dizziness, syncope, light-headedness, numbness and headaches.  Hematological: Negative for adenopathy.  Psychiatric/Behavioral: Negative for agitation, confusion, sleep disturbance and  suicidal ideas. The patient is not nervous/anxious.     OBJECTIVE:   Vitals:  BP 140/80   Pulse 60   Ht 5\' 9"  (1.753 m)   Wt 141 lb (64 kg)   BMI 20.82 kg/m   Physical Exam  Constitutional: She is oriented to person, place, and time. Vital signs are normal. She appears well-developed.  Pulmonary/Chest: Effort normal.  Genitourinary: Vagina normal.    There is no rash, tenderness or lesion on the right labia. There is lesion on the left labia. There is no rash or tenderness on the left labia. Right adnexum displays no mass and no tenderness. Left adnexum displays no mass and no tenderness. No tenderness in the vagina. No vaginal discharge found.  Genitourinary Comments: ~1.5 x 1 CM FIRM, NT NODULE LAT LT LABIA MAJORA TOWARD ING AREA; FEELS ATTACHED TO TENDON; SOMEWHAT FIXED; NO LAN/ERYTHEMA  UTERUS/CX SURG ABSENT  Musculoskeletal: Normal range of motion.  Neurological: She is alert and oriented to person, place, and time.  Psychiatric: She has a normal mood and affect. Her behavior is normal. Thought content normal.  Vitals reviewed.   Assessment/Plan: Nodule of tendon sheath - LT vulvar area. Feels attached to tendon/musculature. Discussed with Dr. Georgianne Fick. Refer to gyn onc for further eval/poss exc. - Plan: Ambulatory referral to Gynecologic Oncology  Nodule of vagina - Plan: Ambulatory referral to Gynecologic  Oncology    Return if symptoms worsen or fail to improve.  Alicia B. Copland, PA-C 07/19/2018 10:26 AM

## 2018-07-31 DIAGNOSIS — N9089 Other specified noninflammatory disorders of vulva and perineum: Secondary | ICD-10-CM | POA: Insufficient documentation

## 2018-07-31 NOTE — H&P (View-Only) (Signed)
Gynecologic Oncology Consult Visit   Referring Provider: Ardeth Perfect, PA [facility])  Chief Complaint: Vulvar Nodule  Subjective:  Barbara Gates is a 67 y.o. G56 female who is seen in consultation from [place], [place]) for vulvar nodule.  She initially presented to PCP, August 2019, for non painful nodule on left labia.  It was noticed when she had a itch in the area and palpated the vulva and felt a lump on the left side.  No bleeding or pain.       Ultrasound was performed on 07/12/2018 which revealed irregular, hypoechoic mass within the superficial soft tissue of the left labia measuring 1.1 x 0.6 x 0.7 cm.  No tract seen to skin to suggest sebaceous cyst.  No enlarged or morphologically abnormal lymph nodes seen within either groin.   She was referred to GYN and saw Ardeth Perfect, PA on 07/19/18.  On exam, nodule felt attached to tendon and somewhat fixed.  Findings were discussed with Dr. Georgianne Fick who recommended referral to gynecologic oncology for evaluation and possible excision.  Today, no complaints. Hysterectomy approximately 25 years ago for endometriosis/DUB.  Says ovaries also removed.  Used HRT until age 36.  None since then. Maternal family history of cancer.    Problem List: Patient Active Problem List   Diagnosis Date Noted  . Nodule of tendon sheath 07/19/2018  . Chronic pain of both shoulders 04/05/2018  . Hypercalcemia 12/30/2015  . Memory impairment of gradual onset 12/30/2015  . Abnormal blood chemistry 06/11/2015  . Adult idiopathic generalized osteoporosis 06/11/2015  . Colon polyp 06/11/2015  . Closed fracture of proximal end of radius 06/11/2015  . Auditory impairment 06/11/2015  . Blood in the urine 06/11/2015  . Hypercholesteremia 06/11/2015  . Gonalgia 06/11/2015  . Herpes zona 06/11/2015  . Female genuine stress incontinence 06/11/2015  . Chronic constipation 09/02/2014  . Lipoma of back-left 07/18/2012    Past  Medical History: Past Medical History:  Diagnosis Date  . Hypercalcemia   . Hyperlipidemia    Has history of this  . Osteoporosis     Past Surgical History: Past Surgical History:  Procedure Laterality Date  . ABDOMINAL HYSTERECTOMY  1994   Total including ovarie. Due to endometriosis  . BREAST BIOPSY Left 11/08/2012   Dr. Bary Castilla, neg  . Surgical Lipoma Removal; 12/13/2012  12/13/2012    Family History: Family History  Problem Relation Age of Onset  . Cancer Mother        lung, pancreas, melanoma, carcinoid tumors  . Hypertension Mother   . Hyperlipidemia Mother   . Diabetes Mother   . Hypertension Father   . Diabetes Father   . Hyperlipidemia Father   . Parkinson's disease Father   . CAD Father   . Diabetes Brother   . Hypertension Brother   . Breast cancer Sister 64  . Arthritis Sister   . Hypertension Sister     Social History: Social History   Socioeconomic History  . Marital status: Married    Spouse name: Not on file  . Number of children: Not on file  . Years of education: Not on file  . Highest education level: Not on file  Occupational History  . Not on file  Social Needs  . Financial resource strain: Not on file  . Food insecurity:    Worry: Not on file    Inability: Not on file  . Transportation needs:    Medical: Not on file  Non-medical: Not on file  Tobacco Use  . Smoking status: Never Smoker  . Smokeless tobacco: Never Used  Substance and Sexual Activity  . Alcohol use: Yes    Alcohol/week: 1.0 standard drinks    Types: 1 Glasses of wine per week    Comment: margarita  . Drug use: No  . Sexual activity: Not Currently    Birth control/protection: Surgical    Comment: Hysterecetomy  Lifestyle  . Physical activity:    Days per week: Not on file    Minutes per session: Not on file  . Stress: Not on file  Relationships  . Social connections:    Talks on phone: Not on file    Gets together: Not on file    Attends religious  service: Not on file    Active member of club or organization: Not on file    Attends meetings of clubs or organizations: Not on file    Relationship status: Not on file  . Intimate partner violence:    Fear of current or ex partner: Not on file    Emotionally abused: Not on file    Physically abused: Not on file    Forced sexual activity: Not on file  Other Topics Concern  . Not on file  Social History Narrative  . Not on file    Allergies: Allergies  Allergen Reactions  . Other     Mango food    Current Medications: Current Outpatient Medications  Medication Sig Dispense Refill  . aspirin EC 81 MG tablet aspirin 81 mg tablet,delayed release  Take 1 tablet every day by oral route.    . Biotin 1000 MCG tablet     . Calcium-Phosphorus-Vitamin D (CALCIUM GUMMIES PO) Take 250 mg by mouth 2 (two) times daily.    . Carboxymethylcellul-Glycerin (LUBRICATING EYE DROPS OP) Apply to eye.     . cyanocobalamin 100 MCG tablet Take by mouth daily.    Marland Kitchen docusate sodium (COLACE) 50 MG capsule Take 50 mg by mouth 2 (two) times daily.    Marland Kitchen ibuprofen (ADVIL,MOTRIN) 200 MG tablet Take 200 mg by mouth every 6 (six) hours as needed.     Marland Kitchen L-Lysine HCl 500 MG TABS Take by mouth.    . Magnesium 400 MG CAPS Take 250 mg by mouth 2 (two) times daily.     . Multiple Minerals-Vitamins (CALCIUM CITRATE-MAG-MINERALS PO) Take 250 mg by mouth daily.    . Multiple Vitamin (MULTIVITAMIN) tablet Take 1 tablet by mouth daily.    . Multiple Vitamins-Minerals (EYE VITAMINS) CAPS Take by mouth daily.    . Polyethylene Glycol 3350 (MIRALAX PO) Take by mouth daily.     . raloxifene (EVISTA) 60 MG tablet TAKE 1 TABLET BY MOUTH EVERY DAY 90 tablet 1  . senna-docusate (SENOKOT-S) 8.6-50 MG tablet Take by mouth.    Orlie Dakin Sodium (SENNA-DOCUSATE SODIUM PO) Take by mouth.     . TURMERIC PO Take by mouth.     No current facility-administered medications for this visit.    Review of Systems General:  negative for fevers, chills, fatigue, changes in sleep, changes in weight or appetite Skin: negative for changes in color, texture, moles or lesions Eyes: negative for changes in vision, pain, diplopia HEENT: negative for change in hearing, pain, discharge, tinnitus, vertigo, voice changes, sore throat, neck masses Breasts: negative for breast lumps Pulmonary: negative for dyspnea, orthopnea, productive cough Cardiac: negative for palpitations, syncope, pain, discomfort, pressure Gastrointestinal: negative for dysphagia, nausea, vomiting,  jaundice, pain, constipation, diarrhea, hematemesis, hematochezia Genitourinary/Sexual: negative for dysuria, discharge, hesitancy, nocturia, retention, stones, infections, STD's, incontinence Ob/Gyn: negative for irregular bleeding, pain Musculoskeletal: negative for pain, stiffness, swelling, range of motion limitation Hematology: negative for easy bruising, bleeding Neurologic/Psych: negative for headaches, seizures, paralysis, weakness, tremor, change in gait, change in sensation, mood swings, depression, anxiety, change in memory   Objective:  Physical Examination:  There were no vitals taken for this visit.    ECOG Performance Status: 0 - Asymptomatic  GENERAL: Patient is a well appearing female in no acute distress HEENT:  Sclerae anicteric.  Oropharynx clear and moist. No ulcerations or evidence of oropharyngeal candidiasis. Neck is supple.  NODES:  No cervical, supraclavicular, or axillary lymphadenopathy palpated.  LUNGS:  Clear to auscultation bilaterally.  No wheezes or rhonchi. HEART:  Regular rate and rhythm. No murmur appreciated. ABDOMEN:  Soft, nontender.  Positive, normoactive bowel sounds. No organomegaly palpated.Groins do not have any abnormal lymph nodes palpable.  MSK:  No focal spinal tenderness to palpation. Full range of motion bilaterally in the upper extremities. EXTREMITIES:  No peripheral edema.   SKIN:  Clear with no  obvious rashes or skin changes. No nail dyscrasia. NEURO:  Nonfocal. Well oriented.  Appropriate affect.  Pelvic: Exam Chaperoned by RN EGBUS: no lesions visible.  There is 2 cm smooth mass under the skin in the upper left labia majora.   Vagina: no lesions, no discharge or bleeding Bimanual: no palpable masses Rectovaginal: confirmatory      Assessment:  Barbara Gates is a 67 y.o. female diagnosed with small non-tender vulvar mass in the area of the left bulbocavernosus muscle.  Based on exam and [place] I suspect this is benign, possibly fibroma.  Cannot rule out cancer though.  Plan:   Problem List Items Addressed This Visit      Other   Vulvar lesion      We discussed options for management including excision of the mass and frozen section.  If cancer is found on frozen can do left inguinofemoral lymph node mapping and biopsy and/or sampling.  Latter may be the best approach if this is cancer, as it is probably not squamous cancer.  SLN approach is not validated for other types of cancer such as sarcoma.      The risks of surgery were discussed in detail and she understands these to include infection; wound separation; delayed wound healing; injury to adjacent structures such as vagina, bladder, bowel, urethra, blood vessels, anus and nerves;  failure to remove the entire affected area; bleeding which may require blood transfusion; anesthesia risk; thromboembolic events; unforeseen complications; possible need for further surgery: if staging performed the risk of lymphedema and lymphocyst.  VTE and antibiotic prophylaxis not indicated. She voiced a clear understanding.  She had the opportunity to ask questions and written informed consent was obtained today.  Suggested return to clinic in  4 weeks.    The patient's diagnosis, an outline of the further diagnostic and laboratory studies which will be required, the recommendation for surgery, and alternatives were discussed with her  and her accompanying family members.  All questions were answered to their satisfaction.  A total of 40 minutes were spent with the patient/family today; 30% was spent in education, counseling and coordination of care for vulvar mass.    Beckey Rutter, DNP, AGNP-C [place] [facility] [phone] (work cell) [phone] (office)  CC:  Amalia Greenhouse [address] [place], [place] [address] [phone]  I personally interviewed and examined the patient. Agreed with the above/below plan of care. Patient/family questions were answered.  Mellody Drown, MD

## 2018-07-31 NOTE — Progress Notes (Signed)
Gynecologic Oncology Consult Visit   Referring Provider: Ardeth Perfect, PA [facility])  Chief Complaint: Vulvar Nodule  Subjective:  Barbara Gates is a 67 y.o. G17 female who is seen in consultation from [place], [place]) for vulvar nodule.  She initially presented to PCP, August 2019, for non painful nodule on left labia.  It was noticed when she had a itch in the area and palpated the vulva and felt a lump on the left side.  No bleeding or pain.       Ultrasound was performed on 07/12/2018 which revealed irregular, hypoechoic mass within the superficial soft tissue of the left labia measuring 1.1 x 0.6 x 0.7 cm.  No tract seen to skin to suggest sebaceous cyst.  No enlarged or morphologically abnormal lymph nodes seen within either groin.   She was referred to GYN and saw Ardeth Perfect, PA on 07/19/18.  On exam, nodule felt attached to tendon and somewhat fixed.  Findings were discussed with Dr. Georgianne Fick who recommended referral to gynecologic oncology for evaluation and possible excision.  Today, no complaints. Hysterectomy approximately 25 years ago for endometriosis/DUB.  Says ovaries also removed.  Used HRT until age 6.  None since then. Maternal family history of cancer.    Problem List: Patient Active Problem List   Diagnosis Date Noted  . Nodule of tendon sheath 07/19/2018  . Chronic pain of both shoulders 04/05/2018  . Hypercalcemia 12/30/2015  . Memory impairment of gradual onset 12/30/2015  . Abnormal blood chemistry 06/11/2015  . Adult idiopathic generalized osteoporosis 06/11/2015  . Colon polyp 06/11/2015  . Closed fracture of proximal end of radius 06/11/2015  . Auditory impairment 06/11/2015  . Blood in the urine 06/11/2015  . Hypercholesteremia 06/11/2015  . Gonalgia 06/11/2015  . Herpes zona 06/11/2015  . Female genuine stress incontinence 06/11/2015  . Chronic constipation 09/02/2014  . Lipoma of back-left 07/18/2012    Past  Medical History: Past Medical History:  Diagnosis Date  . Hypercalcemia   . Hyperlipidemia    Has history of this  . Osteoporosis     Past Surgical History: Past Surgical History:  Procedure Laterality Date  . ABDOMINAL HYSTERECTOMY  1994   Total including ovarie. Due to endometriosis  . BREAST BIOPSY Left 11/08/2012   Dr. Bary Castilla, neg  . Surgical Lipoma Removal; 12/13/2012  12/13/2012    Family History: Family History  Problem Relation Age of Onset  . Cancer Mother        lung, pancreas, melanoma, carcinoid tumors  . Hypertension Mother   . Hyperlipidemia Mother   . Diabetes Mother   . Hypertension Father   . Diabetes Father   . Hyperlipidemia Father   . Parkinson's disease Father   . CAD Father   . Diabetes Brother   . Hypertension Brother   . Breast cancer Sister 34  . Arthritis Sister   . Hypertension Sister     Social History: Social History   Socioeconomic History  . Marital status: Married    Spouse name: Not on file  . Number of children: Not on file  . Years of education: Not on file  . Highest education level: Not on file  Occupational History  . Not on file  Social Needs  . Financial resource strain: Not on file  . Food insecurity:    Worry: Not on file    Inability: Not on file  . Transportation needs:    Medical: Not on file  Non-medical: Not on file  Tobacco Use  . Smoking status: Never Smoker  . Smokeless tobacco: Never Used  Substance and Sexual Activity  . Alcohol use: Yes    Alcohol/week: 1.0 standard drinks    Types: 1 Glasses of wine per week    Comment: margarita  . Drug use: No  . Sexual activity: Not Currently    Birth control/protection: Surgical    Comment: Hysterecetomy  Lifestyle  . Physical activity:    Days per week: Not on file    Minutes per session: Not on file  . Stress: Not on file  Relationships  . Social connections:    Talks on phone: Not on file    Gets together: Not on file    Attends religious  service: Not on file    Active member of club or organization: Not on file    Attends meetings of clubs or organizations: Not on file    Relationship status: Not on file  . Intimate partner violence:    Fear of current or ex partner: Not on file    Emotionally abused: Not on file    Physically abused: Not on file    Forced sexual activity: Not on file  Other Topics Concern  . Not on file  Social History Narrative  . Not on file    Allergies: Allergies  Allergen Reactions  . Other     Mango food    Current Medications: Current Outpatient Medications  Medication Sig Dispense Refill  . aspirin EC 81 MG tablet aspirin 81 mg tablet,delayed release  Take 1 tablet every day by oral route.    . Biotin 1000 MCG tablet     . Calcium-Phosphorus-Vitamin D (CALCIUM GUMMIES PO) Take 250 mg by mouth 2 (two) times daily.    . Carboxymethylcellul-Glycerin (LUBRICATING EYE DROPS OP) Apply to eye.     . cyanocobalamin 100 MCG tablet Take by mouth daily.    Marland Kitchen docusate sodium (COLACE) 50 MG capsule Take 50 mg by mouth 2 (two) times daily.    Marland Kitchen ibuprofen (ADVIL,MOTRIN) 200 MG tablet Take 200 mg by mouth every 6 (six) hours as needed.     Marland Kitchen L-Lysine HCl 500 MG TABS Take by mouth.    . Magnesium 400 MG CAPS Take 250 mg by mouth 2 (two) times daily.     . Multiple Minerals-Vitamins (CALCIUM CITRATE-MAG-MINERALS PO) Take 250 mg by mouth daily.    . Multiple Vitamin (MULTIVITAMIN) tablet Take 1 tablet by mouth daily.    . Multiple Vitamins-Minerals (EYE VITAMINS) CAPS Take by mouth daily.    . Polyethylene Glycol 3350 (MIRALAX PO) Take by mouth daily.     . raloxifene (EVISTA) 60 MG tablet TAKE 1 TABLET BY MOUTH EVERY DAY 90 tablet 1  . senna-docusate (SENOKOT-S) 8.6-50 MG tablet Take by mouth.    Orlie Dakin Sodium (SENNA-DOCUSATE SODIUM PO) Take by mouth.     . TURMERIC PO Take by mouth.     No current facility-administered medications for this visit.    Review of Systems General:  negative for fevers, chills, fatigue, changes in sleep, changes in weight or appetite Skin: negative for changes in color, texture, moles or lesions Eyes: negative for changes in vision, pain, diplopia HEENT: negative for change in hearing, pain, discharge, tinnitus, vertigo, voice changes, sore throat, neck masses Breasts: negative for breast lumps Pulmonary: negative for dyspnea, orthopnea, productive cough Cardiac: negative for palpitations, syncope, pain, discomfort, pressure Gastrointestinal: negative for dysphagia, nausea, vomiting,  jaundice, pain, constipation, diarrhea, hematemesis, hematochezia Genitourinary/Sexual: negative for dysuria, discharge, hesitancy, nocturia, retention, stones, infections, STD's, incontinence Ob/Gyn: negative for irregular bleeding, pain Musculoskeletal: negative for pain, stiffness, swelling, range of motion limitation Hematology: negative for easy bruising, bleeding Neurologic/Psych: negative for headaches, seizures, paralysis, weakness, tremor, change in gait, change in sensation, mood swings, depression, anxiety, change in memory   Objective:  Physical Examination:  There were no vitals taken for this visit.    ECOG Performance Status: 0 - Asymptomatic  GENERAL: Patient is a well appearing female in no acute distress HEENT:  Sclerae anicteric.  Oropharynx clear and moist. No ulcerations or evidence of oropharyngeal candidiasis. Neck is supple.  NODES:  No cervical, supraclavicular, or axillary lymphadenopathy palpated.  LUNGS:  Clear to auscultation bilaterally.  No wheezes or rhonchi. HEART:  Regular rate and rhythm. No murmur appreciated. ABDOMEN:  Soft, nontender.  Positive, normoactive bowel sounds. No organomegaly palpated.Groins do not have any abnormal lymph nodes palpable.  MSK:  No focal spinal tenderness to palpation. Full range of motion bilaterally in the upper extremities. EXTREMITIES:  No peripheral edema.   SKIN:  Clear with no  obvious rashes or skin changes. No nail dyscrasia. NEURO:  Nonfocal. Well oriented.  Appropriate affect.  Pelvic: Exam Chaperoned by RN EGBUS: no lesions visible.  There is 2 cm smooth mass under the skin in the upper left labia majora.   Vagina: no lesions, no discharge or bleeding Bimanual: no palpable masses Rectovaginal: confirmatory      Assessment:  Barbara Gates is a 68 y.o. female diagnosed with small non-tender vulvar mass in the area of the left bulbocavernosus muscle.  Based on exam and [place] I suspect this is benign, possibly fibroma.  Cannot rule out cancer though.  Plan:   Problem List Items Addressed This Visit      Other   Vulvar lesion      We discussed options for management including excision of the mass and frozen section.  If cancer is found on frozen can do left inguinofemoral lymph node mapping and biopsy and/or sampling.  Latter may be the best approach if this is cancer, as it is probably not squamous cancer.  SLN approach is not validated for other types of cancer such as sarcoma.      The risks of surgery were discussed in detail and she understands these to include infection; wound separation; delayed wound healing; injury to adjacent structures such as vagina, bladder, bowel, urethra, blood vessels, anus and nerves;  failure to remove the entire affected area; bleeding which may require blood transfusion; anesthesia risk; thromboembolic events; unforeseen complications; possible need for further surgery: if staging performed the risk of lymphedema and lymphocyst.  VTE and antibiotic prophylaxis not indicated. She voiced a clear understanding.  She had the opportunity to ask questions and written informed consent was obtained today.  Suggested return to clinic in  4 weeks.    The patient's diagnosis, an outline of the further diagnostic and laboratory studies which will be required, the recommendation for surgery, and alternatives were discussed with her  and her accompanying family members.  All questions were answered to their satisfaction.  A total of 40 minutes were spent with the patient/family today; 30% was spent in education, counseling and coordination of care for vulvar mass.    Beckey Rutter, DNP, AGNP-C [place] [facility] [phone] (work cell) [phone] (office)  CC:  Amalia Greenhouse [address] [place], [place] [address] [phone]  I personally interviewed and examined the patient. Agreed with the above/below plan of care. Patient/family questions were answered.  Mellody Drown, MD

## 2018-08-01 ENCOUNTER — Encounter: Payer: Self-pay | Admitting: Obstetrics and Gynecology

## 2018-08-01 ENCOUNTER — Inpatient Hospital Stay: Payer: Medicare Other | Attending: Obstetrics and Gynecology | Admitting: Obstetrics and Gynecology

## 2018-08-01 VITALS — BP 126/78 | HR 84 | Temp 98.7°F | Resp 18 | Ht 69.0 in | Wt 141.3 lb

## 2018-08-01 DIAGNOSIS — Z90710 Acquired absence of both cervix and uterus: Secondary | ICD-10-CM | POA: Insufficient documentation

## 2018-08-01 DIAGNOSIS — N9089 Other specified noninflammatory disorders of vulva and perineum: Secondary | ICD-10-CM | POA: Insufficient documentation

## 2018-08-01 NOTE — Progress Notes (Signed)
Here to find out what might be going on with her vagina nodule

## 2018-08-01 NOTE — Addendum Note (Signed)
Addended by: Mellody Drown on: 08/01/2018 03:25 PM   Modules accepted: Orders, SmartSet

## 2018-08-03 NOTE — Progress Notes (Signed)
Surgery booking request faxed to OR scheduling with confirmation of receipt. Copy of consent faxed to PAT with confirmation of receipt. Original consent sent to medical records for filing. Surgery scheduled for 08/22/18 with Dr. Fransisca Connors. Oncology Nurse Navigator Documentation  Navigator Location: [facility] (08/03/18 0900)   )Navigator Encounter Type: Other (08/03/18 0900)                                                    Time Spent with Patient: 30 (08/03/18 0900)

## 2018-08-06 ENCOUNTER — Telehealth: Payer: Self-pay

## 2018-08-06 NOTE — Telephone Encounter (Signed)
Called and notified Ms. Franchini of PAT appointment, 08/10/18 at 11:00, in the [facility] [address]. Directions given.  Oncology Nurse Navigator Documentation  Navigator Location: [facility] (08/06/18 0800)   )Navigator Encounter Type: Telephone (08/06/18 0800) Telephone: Lahoma Crocker Call (08/06/18 0800)                                                  Time Spent with Patient: 15 (08/06/18 0800)

## 2018-08-10 ENCOUNTER — Encounter: Payer: Self-pay | Admitting: Physician Assistant

## 2018-08-10 ENCOUNTER — Encounter
Admission: RE | Admit: 2018-08-10 | Discharge: 2018-08-10 | Disposition: A | Discharge status: Home / Self Care | Payer: Medicare Other | Source: Ambulatory Visit | Attending: Obstetrics and Gynecology | Admitting: Obstetrics and Gynecology

## 2018-08-10 ENCOUNTER — Other Ambulatory Visit: Payer: Self-pay

## 2018-08-10 ENCOUNTER — Ambulatory Visit (INDEPENDENT_AMBULATORY_CARE_PROVIDER_SITE_OTHER): Payer: Medicare Other | Admitting: Physician Assistant

## 2018-08-10 VITALS — BP 114/76 | HR 61 | Temp 98.2°F | Resp 16 | Wt 142.0 lb

## 2018-08-10 DIAGNOSIS — N9089 Other specified noninflammatory disorders of vulva and perineum: Secondary | ICD-10-CM | POA: Insufficient documentation

## 2018-08-10 DIAGNOSIS — Z7982 Long term (current) use of aspirin: Secondary | ICD-10-CM | POA: Diagnosis not present

## 2018-08-10 DIAGNOSIS — Z79899 Other long term (current) drug therapy: Secondary | ICD-10-CM | POA: Diagnosis not present

## 2018-08-10 DIAGNOSIS — R9431 Abnormal electrocardiogram [ECG] [EKG]: Secondary | ICD-10-CM | POA: Insufficient documentation

## 2018-08-10 DIAGNOSIS — Z136 Encounter for screening for cardiovascular disorders: Secondary | ICD-10-CM | POA: Diagnosis not present

## 2018-08-10 DIAGNOSIS — Z01818 Encounter for other preprocedural examination: Secondary | ICD-10-CM | POA: Diagnosis not present

## 2018-08-10 LAB — CBC WITH DIFFERENTIAL/PLATELET
BASOS ABS: 0 10*3/uL (ref 0–0.1)
BASOS PCT: 1 %
Eosinophils Absolute: 0.1 10*3/uL (ref 0–0.7)
Eosinophils Relative: 1 %
HEMATOCRIT: 40.4 % (ref 35.0–47.0)
HEMOGLOBIN: 14.1 g/dL (ref 12.0–16.0)
LYMPHS PCT: 29 %
Lymphs Abs: 2.2 10*3/uL (ref 1.0–3.6)
MCH: 32.2 pg (ref 26.0–34.0)
MCHC: 34.8 g/dL (ref 32.0–36.0)
MCV: 92.5 fL (ref 80.0–100.0)
MONO ABS: 0.5 10*3/uL (ref 0.2–0.9)
Monocytes Relative: 7 %
NEUTROS ABS: 4.8 10*3/uL (ref 1.4–6.5)
NEUTROS PCT: 62 %
Platelets: 232 10*3/uL (ref 150–440)
RBC: 4.37 MIL/uL (ref 3.80–5.20)
RDW: 12.8 % (ref 11.5–14.5)
WBC: 7.7 10*3/uL (ref 3.6–11.0)

## 2018-08-10 LAB — COMPREHENSIVE METABOLIC PANEL
ALBUMIN: 4.5 g/dL (ref 3.5–5.0)
ALK PHOS: 57 U/L (ref 38–126)
ALT: 16 U/L (ref 0–44)
AST: 25 U/L (ref 15–41)
Anion gap: 7 (ref 5–15)
BILIRUBIN TOTAL: 0.7 mg/dL (ref 0.3–1.2)
BUN: 14 mg/dL (ref 8–23)
CALCIUM: 9.2 mg/dL (ref 8.9–10.3)
CO2: 27 mmol/L (ref 22–32)
CREATININE: 0.73 mg/dL (ref 0.44–1.00)
Chloride: 104 mmol/L (ref 98–111)
GFR calc Af Amer: >60 mL/min (ref >60)
GLUCOSE: 95 mg/dL (ref 70–99)
Potassium: 4.1 mmol/L (ref 3.5–5.1)
Sodium: 138 mmol/L (ref 135–145)
TOTAL PROTEIN: 7.4 g/dL (ref 6.5–8.1)

## 2018-08-10 LAB — TYPE AND SCREEN
ABO/RH(D): O POS
ANTIBODY SCREEN: NEGATIVE

## 2018-08-10 LAB — APTT: APTT: 30 s (ref 24–36)

## 2018-08-10 LAB — PROTIME-INR
INR: 1.01
PROTHROMBIN TIME: 13.2 s (ref 11.4–15.2)

## 2018-08-10 MED ORDER — TRIAMCINOLONE ACETONIDE 0.1 % EX CREA: 1.0000 "application " | TOPICAL_CREAM | Freq: Two times a day (BID) | CUTANEOUS | 0 refills | Status: DC

## 2018-08-10 NOTE — Patient Instructions (Signed)
Your procedure is scheduled on: Wednesday 08/22/18.  Report to DAY SURGERY DEPARTMENT LOCATED ON 2ND FLOOR MEDICAL MALL ENTRANCE. To find out your arrival time please call [phone] between 1PM - 3PM on Tuesday 08/21/18.  Remember: Instructions that are not followed completely may result in serious medical risk, up to and including death, or upon the discretion of your surgeon and anesthesiologist your surgery may need to be rescheduled.     _X__ 1. Do not eat food after midnight the night before your procedure.                 No gum chewing or hard candies. You may drink clear liquids up to 2 hours                 before you are scheduled to arrive for your surgery- DO NOT drink clear                 liquids within 2 hours of the start of your surgery.                 Clear Liquids include:  water, apple juice without pulp, clear carbohydrate                 drink such as Clearfast or Gatorade, Black Coffee or Tea (Do not add                 anything to coffee or tea).  __X__2.  On the morning of surgery brush your teeth with toothpaste and water, you may rinse your mouth with mouthwash if you wish.  Do not swallow any toothpaste or mouthwash.     _X__ 3.  No Alcohol for 24 hours before or after surgery.   _X__ 4.  Do Not Smoke or use e-cigarettes For 24 Hours Prior to Your Surgery.                 Do not use any chewable tobacco products for at least 6 hours prior to                 surgery.  ____  5.  Bring all medications with you on the day of surgery if instructed.   __X__  6.  Notify your doctor if there is any change in your medical condition      (cold, fever, infections).     Do not wear jewelry, make-up, hairpins, clips or nail polish. Do not wear lotions, powders, or perfumes.  Do not shave 48 hours prior to surgery. Men may shave face and neck. Do not bring valuables to the hospital.    [facility] is not responsible for any belongings or valuables.  Contacts,  dentures/partials or body piercings may not be worn into surgery. Bring a case for your contacts, glasses or hearing aids, a denture cup will be supplied. Leave your suitcase in the car. After surgery it may be brought to your room. For patients admitted to the hospital, discharge time is determined by your treatment team.   Patients discharged the day of surgery will not be allowed to drive home.   Please read over the following fact sheets that you were given:   MRSA Information  __X__ Take no medications on morning of surgery.    __X__ Use CHG Soap as directed  __X__ Stop Blood Thinners Coumadin/Plavix/Xarelto/Pleta/Pradaxa/Eliquis/Effient/Aspirin   __X__ Stop Anti-inflammatories 7 days before surgery such as Advil, Ibuprofen, Motrin, BC or Goodies Powder, Naprosyn, Naproxen, Aleve,  Aspirin, Meloxicam. May take Tylenol if needed for pain or discomfort. Last dose Wednesday 08/15/18.   __X__ Stop all herbal supplements, fish oil or vitamin E until after surgery. Stop them on Wednesday 08/15/18.

## 2018-08-10 NOTE — Pre-Procedure Instructions (Signed)
Patient denies tape or adhesive allergies, but prefers paper tape as she states her "skin tears easily and is sometimes sensitive to adhesives".

## 2018-08-10 NOTE — Progress Notes (Signed)
Patient: Barbara Gates Female    DOB: 03-18-1951   67 y.o.   MRN: [id number] Visit Date: 08/10/2018  Today's Provider: Mar Daring, PA-C   Chief Complaint  Patient presents with  . Follow-up   Subjective:    HPI Patient here today C/O itching on right side of vulvar on and off for several days. Patient reports she has not visually checked the area. Patient reports using OTC anti fungal cream yesterday. Patient denies any worsening itching. Patient reports she called Dr. Fransisca Connors and they recommend patient to come and be evaluated. Patient is scheduled for surgery on 08/22/18.    Allergies  Allergen Reactions  . Tamoxifen Cough  . Mango Flavor Itching and Rash     Current Outpatient Medications:  .  aspirin EC 81 MG tablet, Take 81 mg by mouth every other day. (Morning), Disp: , Rfl:  .  BIOTIN 5000 PO, Take 5,000 mcg by mouth daily. , Disp: , Rfl:  .  Ca Phosphate-Cholecalciferol (CALCIUM/VITAMIN D3 GUMMIES PO), Take 1 tablet by mouth 2 (two) times daily., Disp: , Rfl:  .  Calcium Citrate-Vitamin D (CALCIUM CITRATE+D3 PO), Take 1 tablet by mouth every other day. (morning), Disp: , Rfl:  .  ibuprofen (ADVIL,MOTRIN) 200 MG tablet, Take 200 mg by mouth daily. , Disp: , Rfl:  .  L-Lysine HCl 500 MG TABS, Take 500 mg by mouth daily. , Disp: , Rfl:  .  Magnesium 250 MG TABS, Take 250 mg by mouth every evening., Disp: , Rfl:  .  Magnesium 400 MG CAPS, Take 800 mg by mouth every evening. , Disp: , Rfl:  .  Multiple Vitamin (MULTIVITAMIN WITH MINERALS) TABS tablet, Take 1 tablet by mouth every other day. Senior Multivitamin every other morning, Disp: , Rfl:  .  Multiple Vitamins-Minerals (EQL VISION FORMULA PO), Take 1 tablet by mouth daily., Disp: , Rfl:  .  Polyethylene Glycol 3350 (MIRALAX PO), Take 17 g by mouth daily. , Disp: , Rfl:  .  polyvinyl alcohol (LIQUIFILM TEARS) 1.4 % ophthalmic solution, Place 1 drop into both eyes 2 (two) times daily., Disp: , Rfl:    .  raloxifene (EVISTA) 60 MG tablet, TAKE 1 TABLET BY MOUTH EVERY DAY (Patient taking differently: Take 60 mg by mouth daily. ), Disp: 90 tablet, Rfl: 1 .  senna-docusate (SENOKOT-S) 8.6-50 MG tablet, Take 2 tablets by mouth every evening. , Disp: , Rfl:  .  Turmeric 450 MG CAPS, Take 450 mg by mouth every evening., Disp: , Rfl:  .  vitamin B-12 (CYANOCOBALAMIN) 500 MCG tablet, Take 500 mcg by mouth daily. Quick dissolve (cherry), Disp: , Rfl:   Review of Systems  Constitutional: Negative.   Respiratory: Negative.   Cardiovascular: Negative.   Gastrointestinal: Negative.   Genitourinary: Negative.   Skin:       Itching on right vulva    Social History   Tobacco Use  . Smoking status: Never Smoker  . Smokeless tobacco: Never Used  Substance Use Topics  . Alcohol use: Yes    Alcohol/week: 1.0 standard drinks    Types: 1 Glasses of wine per week    Comment: margarita   Objective:   BP 114/76 (BP Location: Left Arm, Patient Position: Sitting, Cuff Size: Normal)   Pulse 61   Temp 98.2 F (36.8 C) (Oral)   Resp 16   Wt 142 lb (64.4 kg)   SpO2 99%   BMI 20.97 kg/m  Vitals:  08/10/18 1458  BP: 114/76  Pulse: 61  Resp: 16  Temp: 98.2 F (36.8 C)  TempSrc: Oral  SpO2: 99%  Weight: 142 lb (64.4 kg)     Physical Exam  Constitutional: She appears well-developed and well-nourished.  HENT:  Head: Normocephalic and atraumatic.  Eyes: EOM are normal.  Neck: Normal range of motion. Neck supple.  Pulmonary/Chest: Effort normal. No respiratory distress.  Genitourinary:    There is rash on the right labia. There is no tenderness, lesion or injury on the right labia. There is lesion (stable mass on left labia) on the left labia. There is no rash, tenderness or injury on the left labia. There is erythema (slight erythema at the vaginal opening she reports is still sore from the pelvic exam done last week.) in the vagina. No tenderness in the vagina. No vaginal discharge found.   Psychiatric: She has a normal mood and affect. Her behavior is normal. Judgment and thought content normal.  Vitals reviewed.      Assessment & Plan:     1. Labia irritation Will give small dose of steroid cream for itching for her to use for no more than 5-7 days. Advised to not place internally. Call if no improvements.  - triamcinolone cream (KENALOG) 0.1 %; Apply 1 application topically 2 (two) times daily.  Dispense: 30 g; Refill: 0       Mar Daring, PA-C  [facility]

## 2018-08-13 NOTE — Pre-Procedure Instructions (Signed)
EKG NOTED SB RATE 48. HR NOTED IN OFFICE NOTE 51 ON 5/19

## 2018-08-15 DIAGNOSIS — H40013 Open angle with borderline findings, low risk, bilateral: Secondary | ICD-10-CM | POA: Diagnosis not present

## 2018-08-15 DIAGNOSIS — H2513 Age-related nuclear cataract, bilateral: Secondary | ICD-10-CM | POA: Diagnosis not present

## 2018-08-15 DIAGNOSIS — H04123 Dry eye syndrome of bilateral lacrimal glands: Secondary | ICD-10-CM | POA: Diagnosis not present

## 2018-08-21 DIAGNOSIS — D219 Benign neoplasm of connective and other soft tissue, unspecified: Secondary | ICD-10-CM

## 2018-08-21 HISTORY — DX: Benign neoplasm of connective and other soft tissue, unspecified: D21.9

## 2018-08-22 ENCOUNTER — Encounter
Admission: RE | Disposition: A | Discharge status: Home / Self Care | Payer: Self-pay | Source: Ambulatory Visit | Attending: Obstetrics and Gynecology

## 2018-08-22 ENCOUNTER — Ambulatory Visit: Payer: Medicare Other | Admitting: Certified Registered Nurse Anesthetist

## 2018-08-22 ENCOUNTER — Encounter: Payer: Self-pay | Admitting: Anesthesiology

## 2018-08-22 ENCOUNTER — Other Ambulatory Visit: Payer: Self-pay

## 2018-08-22 ENCOUNTER — Ambulatory Visit
Admission: RE | Admit: 2018-08-22 | Discharge: 2018-08-22 | Disposition: A | Discharge status: Home / Self Care | Payer: Medicare Other | Source: Ambulatory Visit | Attending: Obstetrics and Gynecology | Admitting: Obstetrics and Gynecology

## 2018-08-22 DIAGNOSIS — Z90710 Acquired absence of both cervix and uterus: Secondary | ICD-10-CM | POA: Insufficient documentation

## 2018-08-22 DIAGNOSIS — M810 Age-related osteoporosis without current pathological fracture: Secondary | ICD-10-CM | POA: Diagnosis not present

## 2018-08-22 DIAGNOSIS — Z791 Long term (current) use of non-steroidal anti-inflammatories (NSAID): Secondary | ICD-10-CM | POA: Insufficient documentation

## 2018-08-22 DIAGNOSIS — Z79899 Other long term (current) drug therapy: Secondary | ICD-10-CM | POA: Diagnosis not present

## 2018-08-22 DIAGNOSIS — Z7982 Long term (current) use of aspirin: Secondary | ICD-10-CM | POA: Insufficient documentation

## 2018-08-22 DIAGNOSIS — Z90722 Acquired absence of ovaries, bilateral: Secondary | ICD-10-CM | POA: Insufficient documentation

## 2018-08-22 DIAGNOSIS — N9089 Other specified noninflammatory disorders of vulva and perineum: Secondary | ICD-10-CM

## 2018-08-22 DIAGNOSIS — D280 Benign neoplasm of vulva: Secondary | ICD-10-CM | POA: Insufficient documentation

## 2018-08-22 HISTORY — PX: VULVAR LESION REMOVAL: SHX5391

## 2018-08-22 LAB — ABO/RH: ABO/RH(D): O POS

## 2018-08-22 SURGERY — VULVAR LESION
Anesthesia: General | Laterality: Left

## 2018-08-22 MED ORDER — MIDAZOLAM HCL 2 MG/2ML IJ SOLN
INTRAMUSCULAR | Status: AC
  Filled 2018-08-22: qty 2

## 2018-08-22 MED ORDER — ROCURONIUM BROMIDE 50 MG/5ML IV SOLN
INTRAVENOUS | Status: AC
  Filled 2018-08-22: qty 1

## 2018-08-22 MED ORDER — ONDANSETRON HCL 4 MG/2ML IJ SOLN
INTRAMUSCULAR | Status: DC | PRN
  Administered 2018-08-22: 4 mg via INTRAVENOUS

## 2018-08-22 MED ORDER — FAMOTIDINE 20 MG PO TABS
20.0000 mg | ORAL_TABLET | Freq: Once | ORAL | Status: AC
  Administered 2018-08-22: 20 mg via ORAL

## 2018-08-22 MED ORDER — PHENYLEPHRINE HCL 10 MG/ML IJ SOLN
INTRAMUSCULAR | Status: DC | PRN
  Administered 2018-08-22: 50 ug via INTRAVENOUS

## 2018-08-22 MED ORDER — ACETAMINOPHEN 500 MG PO TABS
ORAL_TABLET | ORAL | Status: AC
  Administered 2018-08-22: 1000 mg via ORAL
  Filled 2018-08-22: qty 2

## 2018-08-22 MED ORDER — CHLORHEXIDINE GLUCONATE CLOTH 2 % EX PADS: 6.0000 | MEDICATED_PAD | Freq: Once | CUTANEOUS | Status: DC

## 2018-08-22 MED ORDER — DEXAMETHASONE SODIUM PHOSPHATE 10 MG/ML IJ SOLN
INTRAMUSCULAR | Status: DC | PRN
  Administered 2018-08-22: 10 mg via INTRAVENOUS

## 2018-08-22 MED ORDER — EPHEDRINE SULFATE 50 MG/ML IJ SOLN
INTRAMUSCULAR | Status: DC | PRN
  Administered 2018-08-22: 10 mg via INTRAVENOUS

## 2018-08-22 MED ORDER — PROPOFOL 10 MG/ML IV BOLUS
INTRAVENOUS | Status: AC
  Filled 2018-08-22: qty 20

## 2018-08-22 MED ORDER — ACETAMINOPHEN 500 MG PO TABS
1000.0000 mg | ORAL_TABLET | ORAL | Status: AC
  Administered 2018-08-22: 1000 mg via ORAL

## 2018-08-22 MED ORDER — DEXAMETHASONE SODIUM PHOSPHATE 10 MG/ML IJ SOLN
INTRAMUSCULAR | Status: AC
  Filled 2018-08-22: qty 1

## 2018-08-22 MED ORDER — ONDANSETRON HCL 4 MG/2ML IJ SOLN
INTRAMUSCULAR | Status: AC
  Filled 2018-08-22: qty 2

## 2018-08-22 MED ORDER — FENTANYL CITRATE (PF) 100 MCG/2ML IJ SOLN: 25.0000 ug | INTRAMUSCULAR | Status: DC | PRN

## 2018-08-22 MED ORDER — FENTANYL CITRATE (PF) 100 MCG/2ML IJ SOLN
INTRAMUSCULAR | Status: DC | PRN
  Administered 2018-08-22 (×2): 25 ug via INTRAVENOUS

## 2018-08-22 MED ORDER — LIDOCAINE HCL (CARDIAC) PF 100 MG/5ML IV SOSY
PREFILLED_SYRINGE | INTRAVENOUS | Status: DC | PRN
  Administered 2018-08-22: 100 mg via INTRAVENOUS

## 2018-08-22 MED ORDER — BUPIVACAINE HCL (PF) 0.5 % IJ SOLN
INTRAMUSCULAR | Status: DC | PRN
  Administered 2018-08-22: 10 mL

## 2018-08-22 MED ORDER — FAMOTIDINE 20 MG PO TABS
ORAL_TABLET | ORAL | Status: AC
  Administered 2018-08-22: 20 mg via ORAL
  Filled 2018-08-22: qty 1

## 2018-08-22 MED ORDER — FENTANYL CITRATE (PF) 100 MCG/2ML IJ SOLN
INTRAMUSCULAR | Status: AC
  Filled 2018-08-22: qty 2

## 2018-08-22 MED ORDER — ONDANSETRON HCL 4 MG/2ML IJ SOLN: 4.0000 mg | Freq: Once | INTRAMUSCULAR | Status: DC | PRN

## 2018-08-22 MED ORDER — PROPOFOL 10 MG/ML IV BOLUS
INTRAVENOUS | Status: DC | PRN
  Administered 2018-08-22: 150 mg via INTRAVENOUS

## 2018-08-22 MED ORDER — LIDOCAINE HCL (PF) 2 % IJ SOLN
INTRAMUSCULAR | Status: AC
  Filled 2018-08-22: qty 10

## 2018-08-22 MED ORDER — LACTATED RINGERS IV SOLN
INTRAVENOUS | Status: DC
  Administered 2018-08-22 (×2): via INTRAVENOUS

## 2018-08-22 MED ORDER — BUPIVACAINE HCL (PF) 0.5 % IJ SOLN
INTRAMUSCULAR | Status: AC
  Filled 2018-08-22: qty 30

## 2018-08-22 MED ORDER — GLYCOPYRROLATE 0.2 MG/ML IJ SOLN
INTRAMUSCULAR | Status: DC | PRN
  Administered 2018-08-22: 0.2 mg via INTRAVENOUS

## 2018-08-22 MED ORDER — MIDAZOLAM HCL 2 MG/2ML IJ SOLN
INTRAMUSCULAR | Status: DC | PRN
  Administered 2018-08-22: 2 mg via INTRAVENOUS

## 2018-08-22 SURGICAL SUPPLY — 42 items
ADH SKN CLS APL DERMABOND .7 (GAUZE/BANDAGES/DRESSINGS) ×1
BLADE SURG 15 STRL LF DISP TIS (BLADE) ×1 IMPLANT
BLADE SURG 15 STRL SS (BLADE) ×3
BLADE SURG SZ10 CARB STEEL (BLADE) ×3 IMPLANT
CANISTER SUCT 1200ML W/VALVE (MISCELLANEOUS) ×3 IMPLANT
CATH ROBINSON RED A/P 16FR (CATHETERS) ×3 IMPLANT
CNTNR SPEC 2.5X3XGRAD LEK (MISCELLANEOUS) ×2
CONT SPEC 4OZ STER OR WHT (MISCELLANEOUS) ×4
CONT SPEC 4OZ STRL OR WHT (MISCELLANEOUS) ×2
CONTAINER SPEC 2.5X3XGRAD LEK (MISCELLANEOUS) ×2 IMPLANT
DERMABOND ADVANCED (GAUZE/BANDAGES/DRESSINGS) ×2
DERMABOND ADVANCED .7 DNX12 (GAUZE/BANDAGES/DRESSINGS) ×1 IMPLANT
DRAPE PERI LITHO V/GYN (MISCELLANEOUS) ×3 IMPLANT
DRAPE UNDER BUTTOCK W/FLU (DRAPES) ×3 IMPLANT
DRSG TELFA 3X8 NADH (GAUZE/BANDAGES/DRESSINGS) ×3 IMPLANT
ELECT CAUTERY BLADE 6.4 (BLADE) ×3 IMPLANT
ELECT CAUTERY NEEDLE TIP 1.0 (MISCELLANEOUS) ×3
ELECT REM PT RETURN 9FT ADLT (ELECTROSURGICAL) ×3
ELECTRODE CAUTERY NEDL TIP 1.0 (MISCELLANEOUS) ×1 IMPLANT
ELECTRODE REM PT RTRN 9FT ADLT (ELECTROSURGICAL) ×1 IMPLANT
GLOVE BIO SURGEON STRL SZ8 (GLOVE) ×3 IMPLANT
GLOVE INDICATOR 8.0 STRL GRN (GLOVE) ×3 IMPLANT
GOWN STRL REUS W/ TWL LRG LVL3 (GOWN DISPOSABLE) ×1 IMPLANT
GOWN STRL REUS W/ TWL XL LVL3 (GOWN DISPOSABLE) ×1 IMPLANT
GOWN STRL REUS W/TWL LRG LVL3 (GOWN DISPOSABLE) ×3
GOWN STRL REUS W/TWL XL LVL3 (GOWN DISPOSABLE) ×3
NEEDLE HYPO 22GX1.5 SAFETY (NEEDLE) ×3 IMPLANT
NS IRRIG 500ML POUR BTL (IV SOLUTION) ×3 IMPLANT
PACK BASIN MINOR ARMC (MISCELLANEOUS) ×3 IMPLANT
PAD OB MATERNITY 4.3X12.25 (PERSONAL CARE ITEMS) ×3 IMPLANT
PAD PREP 24X41 OB/GYN DISP (PERSONAL CARE ITEMS) ×3 IMPLANT
SOL PREP PVP 2OZ (MISCELLANEOUS) ×3
SOLUTION PREP PVP 2OZ (MISCELLANEOUS) ×1 IMPLANT
SPONGE XRAY 4X4 16PLY STRL (MISCELLANEOUS) ×3 IMPLANT
SUT MNCRL 4-0 (SUTURE) ×3
SUT MNCRL 4-0 27XMFL (SUTURE) ×1
SUT VIC AB 2-0 SH 27 (SUTURE) ×3
SUT VIC AB 2-0 SH 27XBRD (SUTURE) ×1 IMPLANT
SUT VIC AB 3-0 SH 27 (SUTURE) ×3
SUT VIC AB 3-0 SH 27X BRD (SUTURE) ×1 IMPLANT
SUTURE MNCRL 4-0 27XMF (SUTURE) ×1 IMPLANT
SYR CONTROL 10ML (SYRINGE) ×3 IMPLANT

## 2018-08-22 NOTE — Transfer of Care (Signed)
Immediate Anesthesia Transfer of Care Note  Patient: MONIQUA ENGEBRETSEN  Procedure(s) Performed: Excision left vulvar mass, possible sentinel node mapping with blue dye, possible left inguinal lymph node biopsy or dissection (Left )  Patient Location: PACU  Anesthesia Type:General  Level of Consciousness: sedated  Airway & Oxygen Therapy: Patient Spontanous Breathing and Patient connected to face mask oxygen  Post-op Assessment: Report given to RN and Post -op Vital signs reviewed and stable  Post vital signs: Reviewed and stable  Last Vitals:  Vitals Value Taken Time  BP 118/73 08/22/2018  1:04 PM  Temp 36.2 C 08/22/2018  1:04 PM  Pulse 60 08/22/2018  1:04 PM  Resp 13 08/22/2018  1:04 PM  SpO2 100 % 08/22/2018  1:04 PM    Last Pain:  Vitals:   08/22/18 0909  TempSrc: Temporal  PainSc: 0-No pain         Complications: No apparent anesthesia complications

## 2018-08-22 NOTE — Anesthesia Post-op Follow-up Note (Signed)
Anesthesia QCDR form completed.        

## 2018-08-22 NOTE — Anesthesia Procedure Notes (Signed)
Procedure Name: LMA Insertion Date/Time: 08/22/2018 11:54 AM Performed by: Johnna Acosta, CRNA Pre-anesthesia Checklist: Patient identified, Emergency Drugs available, Suction available, Patient being monitored and Timeout performed Patient Re-evaluated:Patient Re-evaluated prior to induction Oxygen Delivery Method: Circle system utilized Preoxygenation: Pre-oxygenation with 100% oxygen Induction Type: IV induction LMA: LMA inserted LMA Size: 3.5 Tube type: Oral Number of attempts: 1 Placement Confirmation: positive ETCO2 and breath sounds checked- equal and bilateral Tube secured with: Tape Dental Injury: Teeth and Oropharynx as per pre-operative assessment

## 2018-08-22 NOTE — Op Note (Signed)
    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  CONDITION: stable  INDICATIONS: Small 8 mm pea size nodule under the skin lateral to left anterior vulva. Groins negative.   FINDINGS: same  PROCEDURE IN DETAIL: After induction of general anesthesia the patient was prepped and draped in [place].  Bladder was emptied.  A 5 cm vertical incision was made lateral to the left anterior vulva.  The bovie was used to dissect out the small mass and it was submitted to pathology.  Frozen section showed small granular cell tumor.  Good hemostasis was observed.  Subcutaneous tissue was closed with interrupted 2-0 Vicryl and the skin was closed with running 4-0 Biosyn suture. Dressing was placed.  VTE prophylaxis: SCDs  Proph antibiotics: not indicated.  Mellody Drown, MD

## 2018-08-22 NOTE — Discharge Instructions (Signed)
Vulvectomy, Care After Refer to this sheet in the next few weeks. These instructions provide you with information about caring for yourself after your procedure. Your health care provider may also give you more specific instructions. Your treatment has been planned according to current medical practices, but problems sometimes occur. Call your health care provider if you have any problems or questions after your procedure. What can I expect after the procedure? After the procedure, it is common to have:  Vaginal pain.  Vaginal numbness.  Vaginal swelling.  Bloody vaginal discharge.  Follow these instructions at home: Activity   Rest as told by your health care provider.  Do not lift, push, or pull more than 5 lb (2.3 kg).  Avoid activities that require a lot of energy for as long as told by your health care provider. This includes any exercise.  Raise (elevate) your legs while sitting or lying down.  Avoid standing or sitting in one place for long periods of time.  Do not cross your legs, especially when sitting. Bathing  Do not take baths, swim, or use a hot tub until your health care provider approves. Ask your health care provider if you can take showers. You may only be allowed to take sponge baths for bathing.  After passing urine or a bowel movement, wipe yourself from front to back and clean your vaginal area using a spray bottle.  If told by your health care provider, take a sitz bath to help with discomfort. This is a warm water bath you take while sitting down. ? Do this 3-4 times per day, or as often as told by your health care provider. ? The water should only come up to your hips and cover your buttocks. ? You may pat the area dry with a soft, clean towel. If needed, you may then gently dry the area with a hair dryer on a cool setting for 5-10 minutes. An enclosed box fan may also be used to gently dry the area. Incision care   Follow instructions from your  health care provider about how to take care of your incision. Make sure you: ? Wash your hands with soap and water before you change your bandage (dressing). If soap and water are not available, use hand sanitizer. ? Change your dressing as told by your health care provider. ? Leave stitches (sutures), skin glue, adhesive strips, or surgical clips in place. These skin closures may need to stay in place for 2 weeks or longer. If adhesive strip edges start to loosen and curl up, you may trim the loose edges. Do not remove adhesive strips completely unless your health care provider tells you to do that.  Check your incision area every day for signs of infection. Check for: ? More redness, swelling, or pain. ? More fluid or blood. ? Warmth. ? Pus or a bad smell. Lifestyle  Do not douche or use tampons until your health care provider approves.  Do not have sex until your health care provider approves. Tell your health care provider if you have pain or numbness when you return to sexual activity.  Wear comfortable, loose-fitting clothing. Driving  Do not drive or operate heavy machinery while taking prescription pain medicine.  Do not drive for 24 hours if you received a medicine to help you relax (sedative). General instructions   Take over-the-counter and prescription medicines only as told by your health care provider.  Take a stool softener to help prevent constipation as told by your  health care provider. You may need to do this if you are taking prescription pain medicine. °· Drink enough fluid to keep your urine clear or pale yellow. °· If you were sent home with a drain, take care of it as told by your health care provider. °· Wear compression stockings as told by your health care provider. These stockings help to prevent blood clots and reduce swelling in your legs. °· Keep all follow-up visits as told by your health care provider. This is important. °Contact a health care provider  if: °· You have more redness, swelling, or pain around your incision. °· You have more fluid or blood coming from your incision. °· Your incision feels warm to the touch. °· You have pus or a bad smell coming from your incision. °· You have a fever. °· You have painful or bloody urination. °· You feel nauseous or you vomit. °· You develop diarrhea. °· You develop constipation. °· You develop a rash. °· You feel dizzy or light-headed. °· You have pain that does not get better with medicine. °· Your incision breaks open. °Get help right away if: °· You faint. °· You develop leg or chest pain. °· You develop abdominal pain. °· You develop shortness of breath. °This information is not intended to replace advice given to you by your health care provider. Make sure you discuss any questions you have with your health care provider. °Document Released: 06/21/2004 Document Revised: 04/14/2016 Document Reviewed: 11/02/2015 °Elsevier Interactive Patient Education © 2018 [employer] ° °

## 2018-08-22 NOTE — Interval H&P Note (Signed)
History and Physical Interval Note:  08/22/2018 11:03 AM  Barbara Gates  has presented today for surgery, with the diagnosis of vulvar mass  The various methods of treatment have been discussed with the patient and family. After consideration of risks, benefits and other options for treatment, the patient has consented to  Procedure(s): Excision left vulvar mass, possible sentinel node mapping with blue dye, possible left inguinal lymph node biopsy or dissection (Left) as a surgical intervention .  The patient's history has been reviewed, patient examined, no change in status, stable for surgery.  I have reviewed the patient's chart and labs.  Questions were answered to the patient's satisfaction.  MRI examination of the chest     [facility]

## 2018-08-22 NOTE — Anesthesia Preprocedure Evaluation (Signed)
Anesthesia Evaluation  Patient identified by MRN, date of birth, ID band Patient awake    Reviewed: Allergy & Precautions, NPO status , Patient's Chart, lab work & pertinent test results, reviewed documented beta blocker date and time   Airway Mallampati: II  TM Distance: >3 FB     Dental  (+) Chipped   Pulmonary           Cardiovascular      Neuro/Psych    GI/Hepatic   Endo/Other    Renal/GU      Musculoskeletal   Abdominal   Peds  Hematology   Anesthesia Other Findings   Reproductive/Obstetrics                             Anesthesia Physical Anesthesia Plan  ASA: II  Anesthesia Plan: General   Post-op Pain Management:    Induction: Intravenous  PONV Risk Score and Plan:   Airway Management Planned: Oral ETT  Additional Equipment:   Intra-op Plan:   Post-operative Plan:   Informed Consent: I have reviewed the patients History and Physical, chart, labs and discussed the procedure including the risks, benefits and alternatives for the proposed anesthesia with the patient or authorized representative who has indicated his/her understanding and acceptance.     Plan Discussed with: CRNA  Anesthesia Plan Comments:         Anesthesia Quick Evaluation  

## 2018-08-22 NOTE — Anesthesia Postprocedure Evaluation (Signed)
Anesthesia Post Note  Patient: Barbara Gates  Procedure(s) Performed: Excision left vulvar mass, possible sentinel node mapping with blue dye, possible left inguinal lymph node biopsy or dissection (Left )  Patient location during evaluation: PACU Anesthesia Type: General Level of consciousness: awake and alert Pain management: pain level controlled Vital Signs Assessment: post-procedure vital signs reviewed and stable Respiratory status: spontaneous breathing, nonlabored ventilation, respiratory function stable and patient connected to nasal cannula oxygen Cardiovascular status: blood pressure returned to baseline and stable Postop Assessment: no apparent nausea or vomiting Anesthetic complications: no     Last Vitals:  Vitals:   08/22/18 1349 08/22/18 1359  BP: 131/76 (!) 141/66  Pulse: (!) 53 (!) 51  Resp: 13   Temp:  (!) 35.8 C  SpO2: 98% 97%    Last Pain:  Vitals:   08/22/18 1359  TempSrc:   PainSc: [place]

## 2018-08-23 ENCOUNTER — Encounter: Payer: Self-pay | Admitting: Obstetrics and Gynecology

## 2018-08-23 LAB — SURGICAL PATHOLOGY

## 2018-08-27 ENCOUNTER — Telehealth: Payer: Self-pay

## 2018-08-27 NOTE — Telephone Encounter (Signed)
Call placed to Barbara Gates. Notifed and educated further on pathology report from vulvar mass lesion. She is doing well post op.  DIAGNOSIS:  A. 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  - THE MARGIN IS FOCALLY INVOLVED.      Oncology Nurse Navigator Documentation  Navigator Location: [facility] (08/27/18 0900)   )Navigator Encounter Type: Telephone (08/27/18 0900) Telephone: Outgoing Call;Diagnostic Results (08/27/18 0900)                   Patient Visit Type: GynOnc (08/27/18 0900)                              Time Spent with Patient: 15 (08/27/18 0900)

## 2018-09-03 DIAGNOSIS — Z23 Encounter for immunization: Secondary | ICD-10-CM | POA: Diagnosis not present

## 2018-09-19 ENCOUNTER — Inpatient Hospital Stay: Payer: Medicare Other | Attending: Obstetrics and Gynecology | Admitting: Obstetrics and Gynecology

## 2018-09-19 VITALS — BP 129/85 | HR 60 | Temp 97.5°F | Resp 18 | Ht 69.0 in | Wt 142.4 lb

## 2018-09-19 DIAGNOSIS — Z9889 Other specified postprocedural states: Secondary | ICD-10-CM | POA: Insufficient documentation

## 2018-09-19 DIAGNOSIS — D219 Benign neoplasm of connective and other soft tissue, unspecified: Secondary | ICD-10-CM

## 2018-09-19 DIAGNOSIS — C519 Malignant neoplasm of vulva, unspecified: Secondary | ICD-10-CM | POA: Insufficient documentation

## 2018-09-19 NOTE — Progress Notes (Signed)
No new GYN changes but c/o constipation

## 2018-09-19 NOTE — Progress Notes (Signed)
Gynecologic Oncology Interval Visit   Referring Provider: Ardeth Perfect, PA [facility])  Chief Complaint: Granular Cell Tumor of Vulva  Subjective:  Barbara Gates is a 67 y.o. G11 female who was initially seen in consultation for [employer], [facility] [facility]) for vulvar nodule. She returns to clinic today for post-operative exam and discussion of pathology results.   On 08/22/18 she underwent excision of left vulvar mass at [facility] with Dr. Fransisca Connors.  The mass was small and was lateral to the vulva on the left.   DIAGNOSIS:  A. 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  - THE MARGIN IS FOCALLY INVOLVED.   History:  She initially presented to PCP, August 2019, for non painful nodule on left labia.  It was noticed when she had a itch in the area and palpated the vulva and felt a lump on the left side.  No bleeding or pain.       Ultrasound was performed on 07/12/2018 which revealed irregular, hypoechoic mass within the superficial soft tissue of the left labia measuring 1.1 x 0.6 x 0.7 cm.  No tract seen to skin to suggest sebaceous cyst.  No enlarged or morphologically abnormal lymph nodes seen within either groin.   She was referred to GYN and saw Ardeth Perfect, PA on 07/19/18.  On exam, nodule felt attached to tendon and somewhat fixed.  Findings were discussed with Dr. Georgianne Fick who recommended referral to gynecologic oncology for evaluation and possible excision.  Hysterectomy approximately 25 years ago for endometriosis/DUB.  Says ovaries also removed.  Used HRT until age 22.  None since then. Maternal family history of cancer.    Problem List: Patient Active Problem List   Diagnosis Date Noted  . Vulvar lesion 07/31/2018  . Nodule of tendon sheath 07/19/2018  . Chronic pain of both shoulders 04/05/2018  . Hypercalcemia 12/30/2015  . Memory impairment of gradual onset 12/30/2015  . Abnormal blood chemistry 06/11/2015  . Adult  idiopathic generalized osteoporosis 06/11/2015  . Colon polyp 06/11/2015  . Closed fracture of proximal end of radius 06/11/2015  . Auditory impairment 06/11/2015  . Blood in the urine 06/11/2015  . Hypercholesteremia 06/11/2015  . Gonalgia 06/11/2015  . Herpes zona 06/11/2015  . Female genuine stress incontinence 06/11/2015  . Chronic constipation 09/02/2014  . Lipoma of back-left 07/18/2012    Past Medical History: Past Medical History:  Diagnosis Date  . Endometriosis   . Hypercalcemia   . Hyperlipidemia    Has history of this  . Nodule of vagina   . Osteoporosis   . Osteoporosis     Past Surgical History: Past Surgical History:  Procedure Laterality Date  . ABDOMINAL HYSTERECTOMY  1994   Total including ovarie. Due to endometriosis  . APPENDECTOMY     with hysterectomy   . BREAST BIOPSY Left 11/08/2012   Dr. Bary Castilla, neg  . OTHER SURGICAL HISTORY    . Surgical Lipoma Removal; 12/13/2012  12/13/2012  . VULVAR LESION REMOVAL Left 08/22/2018   Procedure: Excision left vulvar mass, possible sentinel node mapping with blue dye, possible left inguinal lymph node biopsy or dissection;  Surgeon: Mellody Drown, MD;  Location: [facility] ORS;  Service: Gynecology;  Laterality: Left;    Family History: Family History  Problem Relation Age of Onset  . Cancer Mother        lung, pancreas, melanoma, carcinoid tumors  . Hypertension Mother   . Hyperlipidemia Mother   . Diabetes  Mother   . Hypertension Father   . Diabetes Father   . Hyperlipidemia Father   . Parkinson's disease Father   . CAD Father   . Diabetes Brother   . Hypertension Brother   . Breast cancer Sister 48  . Arthritis Sister   . Hypertension Sister     Social History: Social History   Socioeconomic History  . Marital status: Married    Spouse name: Not on file  . Number of children: Not on file  . Years of education: Not on file  . Highest education level: Not on file  Occupational History  . Not  on file  Social Needs  . Financial resource strain: Not on file  . Food insecurity:    Worry: Not on file    Inability: Not on file  . Transportation needs:    Medical: Not on file    Non-medical: Not on file  Tobacco Use  . Smoking status: Never Smoker  . Smokeless tobacco: Never Used  Substance and Sexual Activity  . Alcohol use: Yes    Alcohol/week: 1.0 standard drinks    Types: 1 Glasses of wine per week    Comment: margarita  . Drug use: No  . Sexual activity: Not Currently    Birth control/protection: Surgical    Comment: Hysterecetomy  Lifestyle  . Physical activity:    Days per week: Not on file    Minutes per session: Not on file  . Stress: Not on file  Relationships  . Social connections:    Talks on phone: Not on file    Gets together: Not on file    Attends religious service: Not on file    Active member of club or organization: Not on file    Attends meetings of clubs or organizations: Not on file    Relationship status: Not on file  . Intimate partner violence:    Fear of current or ex partner: Not on file    Emotionally abused: Not on file    Physically abused: Not on file    Forced sexual activity: Not on file  Other Topics Concern  . Not on file  Social History Narrative  . Not on file    Allergies: Allergies  Allergen Reactions  . Tamoxifen Cough  . Mango Flavor Itching and Rash    Current Medications: Current Outpatient Medications  Medication Sig Dispense Refill  . BIOTIN 5000 PO Take 5,000 mcg by mouth daily.     . Ca Phosphate-Cholecalciferol (CALCIUM/VITAMIN D3 GUMMIES PO) Take 1 tablet by mouth 2 (two) times daily.    . Magnesium 250 MG TABS Take 250 mg by mouth every evening.    . Magnesium 400 MG CAPS Take 800 mg by mouth every evening.     . Multiple Vitamin (MULTIVITAMIN WITH MINERALS) TABS tablet Take 1 tablet by mouth every other day. Senior Multivitamin every other morning    . Multiple Vitamins-Minerals (EQL VISION FORMULA  PO) Take 1 tablet by mouth daily.    . Polyethylene Glycol 3350 (MIRALAX PO) Take 17 g by mouth daily.     . polyvinyl alcohol (LIQUIFILM TEARS) 1.4 % ophthalmic solution Place 1 drop into both eyes 2 (two) times daily.    . raloxifene (EVISTA) 60 MG tablet TAKE 1 TABLET BY MOUTH EVERY DAY (Patient taking differently: Take 60 mg by mouth daily. ) 90 tablet 1  . senna-docusate (SENOKOT-S) 8.6-50 MG tablet Take 1 tablet by mouth every evening.     Marland Kitchen  Turmeric 450 MG CAPS Take 450 mg by mouth every evening.    . vitamin B-12 (CYANOCOBALAMIN) 500 MCG tablet Take 500 mcg by mouth daily. Quick dissolve (cherry)    . aspirin EC 81 MG tablet Take 81 mg by mouth every other day. (Morning)    . Calcium Citrate-Vitamin D (CALCIUM CITRATE+D3 PO) Take 1 tablet by mouth every other day. (morning)    . ibuprofen (ADVIL,MOTRIN) 200 MG tablet Take 200 mg by mouth daily.     Marland Kitchen L-Lysine HCl 500 MG TABS Take 500 mg by mouth daily.     Marland Kitchen triamcinolone cream (KENALOG) 0.1 % Apply 1 application topically 2 (two) times daily. (Patient not taking: Reported on 09/19/2018) 30 g 0   No current facility-administered medications for this visit.    Review of Systems General:  no complaints Skin: no complaints Eyes: no complaints HEENT: no complaints Breasts: no complaints Pulmonary: no complaints Cardiac: no complaints Gastrointestinal: no complaints Genitourinary/Sexual: no complaints Ob/Gyn: no complaints Musculoskeletal: no complaints Hematology: no complaints Neurologic/Psych: no complaints   Objective:  Physical Examination:  BP 129/85 (BP Location: Left Arm, Patient Position: Sitting)   Pulse 60   Temp (!) 97.5 F (36.4 C) (Tympanic)   Resp 18   Ht 5\' 9"  (1.753 m)   Wt 142 lb 6.4 oz (64.6 kg)   SpO2 99%   BMI 21.03 kg/m     ECOG Performance Status: 0 - Asymptomatic  GENERAL: Patient is a well appearing female in no acute distress HEENT:  PERRL, neck supple with midline trachea. Thyroid without  masses.  NODES:  No cervical, supraclavicular, axillary, or inguinal lymphadenopathy palpated.  LUNGS:  Clear to auscultation bilaterally.  No wheezes or rhonchi. HEART:  Regular rate and rhythm. No murmur appreciated. ABDOMEN:  Soft, nontender.  Positive, normoactive bowel sounds.  MSK:  No focal spinal tenderness to palpation. Full range of motion bilaterally in the upper extremities. EXTREMITIES:  No peripheral edema.   SKIN:  Clear with no obvious rashes or skin changes. No nail dyscrasia. NEURO:  Nonfocal. Well oriented.  Appropriate affect.   Pelvic: Exam Chaperoned by RN EGBUS: no lesions visible.  Incision lateral to the left vulva is well healed.   Vagina: no lesions, no discharge or bleeding Bimanual: no palpable masses Rectovaginal: confirmatory      Assessment:  Barbara Gates is a 67 y.o. 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  The tumor was small and was removed completely.  Margin is positive, likely because the lesion was arising in the adipose layer and there was not much tissue removed around the mass.   Vulvar incision well healed.   Plan:   Problem List Items Addressed This Visit    None    Visit Diagnoses    Granular cell tumor    -  Primary     We discussed the nature of granular cell tumors of the vulva.  Generally these are benign acting, but local recurrences in the vulva are not uncommon.  She will maintain surveillance by palpating the area frequently to look for new nodules.  She also will be seen at [facility] for exams every six months.  If any new lesions arise we can see her back for evaluation.  CC:  [address], [place] [address] [phone]   I personally interviewed and examined the patient. Agreed with the above/below plan of care. Patient/family questions were answered.  Mellody Drown, MD

## 2018-09-19 NOTE — Patient Instructions (Signed)
Follow up with Cole in 6 months [phone])

## 2018-09-25 ENCOUNTER — Telehealth: Payer: Self-pay

## 2018-09-25 NOTE — Telephone Encounter (Signed)
LMTCB and schedule AWV. Apt needs to be scheduled after 10/18/18 and need to be for ONE hour. -MM

## 2018-09-25 NOTE — Telephone Encounter (Signed)
Spoke with patient on the phone and was trying to schedule her visit to see you and her PCP but when I looked at your schedule for the month of December it states that there is no scheduled found for you? Can you look into this and give patient a call back on her home line? Thanks [employer]

## 2018-09-27 NOTE — Telephone Encounter (Signed)
Pt scheduled AWV for 10/24/18 @ 11 AM and CPE for 11/15/18 @ 2 PM. -MM

## 2018-10-24 ENCOUNTER — Ambulatory Visit (INDEPENDENT_AMBULATORY_CARE_PROVIDER_SITE_OTHER): Payer: Medicare Other

## 2018-10-24 VITALS — BP 122/82 | HR 65 | Temp 99.0°F | Ht 69.0 in | Wt 143.6 lb

## 2018-10-24 DIAGNOSIS — Z0000 Encounter for general adult medical examination without abnormal findings: Secondary | ICD-10-CM

## 2018-10-24 NOTE — Progress Notes (Addendum)
Subjective:   Barbara Gates is a 67 y.o. female who presents for Medicare Annual (Subsequent) preventive examination.  Review of Systems:  N/A  Cardiac Risk Factors include: advanced age (>17men, >71 women)     Objective:     Vitals: BP 122/82 (BP Location: Right Arm)   Pulse 65   Temp 99 F (37.2 C) (Oral)   Ht 5\' 9"  (1.753 m)   Wt 143 lb 9.6 oz (65.1 kg)   BMI 21.21 kg/m   Body mass index is 21.21 kg/m.  Advanced Directives 10/24/2018 09/19/2018 08/22/2018 08/10/2018 08/01/2018 11/22/2017 10/18/2017  Does Patient Have a Medical Advance Directive? Yes Yes Yes Yes Yes Yes Yes  Type of Paramedic of [place];Living will [place];Living will [place];Living will - [place];Living will - [place];Living will  Does patient want to make changes to medical advance directive? - No - Patient declined No - Patient declined - No - Patient declined - -  Copy of [place] in Chart? Yes - validated most recent copy scanned in chart (See row information) Yes Yes Yes Yes - No - copy requested    Tobacco Social History   Tobacco Use  Smoking Status Never Smoker  Smokeless Tobacco Never Used     Counseling given: Not Answered   Clinical Intake:  Pre-visit preparation completed: Yes  Pain : No/denies pain Pain Score: 0-No pain(Left knee sore with movement. )     Nutritional Status: BMI of 19-24  Normal Nutritional Risks: None Diabetes: No  How often do you need to have someone help you when you read instructions, pamphlets, or other written materials from your doctor or pharmacy?: 1 - Never  Interpreter Needed?: No  Information entered by :: [facility], LPN  Past Medical History:  Diagnosis Date  . Endometriosis   . 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 Fransisca Connors  . Hypercalcemia   . Hyperlipidemia    Has history of this  . Nodule of vagina     . Osteoporosis   . Osteoporosis    Past Surgical History:  Procedure Laterality Date  . ABDOMINAL HYSTERECTOMY  1994   Total including ovarie. Due to endometriosis  . APPENDECTOMY     with hysterectomy   . BREAST BIOPSY Left 11/08/2012   Dr. Bary Castilla, neg  . granular cell tumor removed    . OTHER SURGICAL HISTORY    . Surgical Lipoma Removal; 12/13/2012  12/13/2012  . VULVAR LESION REMOVAL Left 08/22/2018   Procedure: Excision left vulvar mass, possible sentinel node mapping with blue dye, possible left inguinal lymph node biopsy or dissection;  Surgeon: Mellody Drown, MD;  Location: [facility] ORS;  Service: Gynecology;  Laterality: Left;   Family History  Problem Relation Age of Onset  . Cancer Mother        lung, pancreas, melanoma, carcinoid tumors  . Hypertension Mother   . Hyperlipidemia Mother   . Diabetes Mother   . Hypertension Father   . Diabetes Father   . Hyperlipidemia Father   . Parkinson's disease Father   . CAD Father   . Diabetes Brother   . Hypertension Brother   . Breast cancer Sister 39  . Arthritis Sister   . Hypertension Sister   . Dupuytren's contracture Sister    Social History   Socioeconomic History  . Marital status: Married    Spouse name: Not on file  . Number of  children: 0  . Years of education: Not on file  . Highest education level: Bachelor's degree (e.g., BA, AB, BS)  Occupational History  . Occupation: retired  Scientific laboratory technician  . Financial resource strain: Not hard at all  . Food insecurity:    Worry: Never true    Inability: Never true  . Transportation needs:    Medical: No    Non-medical: No  Tobacco Use  . Smoking status: Never Smoker  . Smokeless tobacco: Never Used  Substance and Sexual Activity  . Alcohol use: Yes    Alcohol/week: 1.0 standard drinks    Types: 1 Glasses of wine per week    Comment: margarita once a month  . Drug use: No  . Sexual activity: Not Currently    Birth control/protection: Surgical     Comment: Hysterecetomy  Lifestyle  . Physical activity:    Days per week: 1 day    Minutes per session: 40 min  . Stress: Not at all  Relationships  . Social connections:    Talks on phone: Patient refused    Gets together: Patient refused    Attends religious service: Patient refused    Active member of club or organization: Patient refused    Attends meetings of clubs or organizations: Patient refused    Relationship status: Patient refused  Other Topics Concern  . Not on file  Social History Narrative  . Not on file    Outpatient Encounter Medications as of 10/24/2018  Medication Sig  . BIOTIN 5000 PO Take 5,000 mcg by mouth daily.   Marland Kitchen L-Lysine HCl 500 MG TABS Take 500 mg by mouth daily.   . Magnesium 250 MG TABS Take 500 mg by mouth every evening.   . Magnesium 400 MG CAPS Take 400 mg by mouth every evening.   . Multiple Minerals-Vitamins (NUTRA-SUPPORT BONE PO) Take 2 tablets by mouth daily.  . Multiple Vitamin (MULTIVITAMIN WITH MINERALS) TABS tablet Take 1 tablet by mouth every other day. Senior Multivitamin every other morning  . Multiple Vitamins-Minerals (EQL VISION FORMULA PO) Take 1 tablet by mouth daily. 50+  . Polyethylene Glycol 3350 (MIRALAX PO) Take 17 g by mouth daily.   . polyvinyl alcohol (LIQUIFILM TEARS) 1.4 % ophthalmic solution Place 1 drop into both eyes 2 (two) times daily.  . raloxifene (EVISTA) 60 MG tablet TAKE 1 TABLET BY MOUTH EVERY DAY (Patient taking differently: Take 60 mg by mouth daily. )  . senna-docusate (SENOKOT-S) 8.6-50 MG tablet Take 1 tablet by mouth every evening.   . Turmeric 450 MG CAPS Take 500 mg by mouth every evening.   . vitamin B-12 (CYANOCOBALAMIN) 500 MCG tablet Take 500 mcg by mouth daily. Quick dissolve (cherry)  . aspirin EC 81 MG tablet Take 81 mg by mouth every other day. (Morning)  . Ca Phosphate-Cholecalciferol (CALCIUM/VITAMIN D3 GUMMIES PO) Take 1 tablet by mouth 2 (two) times daily.  . Calcium Citrate-Vitamin D  (CALCIUM CITRATE+D3 PO) Take 1 tablet by mouth every other day. (morning)  . ibuprofen (ADVIL,MOTRIN) 200 MG tablet Take 200 mg by mouth daily.   Marland Kitchen triamcinolone cream (KENALOG) 0.1 % Apply 1 application topically 2 (two) times daily. (Patient not taking: Reported on 09/19/2018)   No facility-administered encounter medications on file as of 10/24/2018.     Activities of Daily Living In your present state of health, do you have any difficulty performing the following activities: 10/24/2018 08/10/2018  Hearing? Y N  Comment Around crowds. Does not wear  hearing aids.  -  Vision? Y N  Comment Has cataracts on both eyes.  -  Difficulty concentrating or making decisions? N N  Walking or climbing stairs? N N  Dressing or bathing? N N  Doing errands, shopping? N N  Preparing Food and eating ? N -  Using the Toilet? N -  In the past six months, have you accidently leaked urine? N -  Do you have problems with loss of bowel control? N -  Managing your Medications? N -  Managing your Finances? N -  Housekeeping or managing your Housekeeping? N -  Some recent data might be hidden    Patient Care Team: Mar Daring, PA-C as PCP - General (Family Medicine) Brendolyn Patty, MD as Consulting Physician (Dermatology) Mellody Drown, MD as Referring Physician (Obstetrics and Gynecology)    Assessment:   This is a routine wellness examination for Jakyrah.  Exercise Activities and Dietary recommendations Current Exercise Habits: Home exercise routine, Type of exercise: walking, Time (Minutes): 40, Frequency (Times/Week): 1, Weekly Exercise (Minutes/Week): 40, Intensity: Mild, Exercise limited by: None identified  Goals    . DIET - INCREASE WATER INTAKE     Continue drinking 6-8 glasses of water a day.     . Exercise 150 min/wk Moderate Activity     Recommend to exercise for at least 150 minutes per week.      . Increase water intake     Starting 10/05/16, I will continue to drink 4  glasses of water a day.       Fall Risk Fall Risk  10/24/2018 10/18/2017 10/05/2016  Falls in the past year? 0 No Yes  Number falls in past yr: - - 2 or more  Injury with Fall? - - No   FALL RISK PREVENTION PERTAINING TO THE HOME:  Any stairs in or around the home WITH handrails? Yes  Home free of loose throw rugs in walkways, pet beds, electrical cords, etc? Yes  Adequate lighting in your home to reduce risk of falls? Yes   ASSISTIVE DEVICES UTILIZED TO PREVENT FALLS:  Life alert? No  Use of a cane, walker or w/c? No  Grab bars in the bathroom? No  Shower chair or bench in shower? No  Elevated toilet seat or a handicapped toilet? No     Depression Screen PHQ 2/9 Scores 10/24/2018 10/18/2017 10/05/2016  PHQ - 2 Score 0 0 0     Cognitive Function: Declined today.      6CIT Screen 10/18/2017 10/05/2016  What Year? 0 points 0 points  What month? 0 points 0 points  What time? 0 points 0 points  Count back from 20 0 points 0 points  Months in reverse 0 points 0 points  Repeat phrase 0 points 0 points  Total Score 0 0    Immunization History  Administered Date(s) Administered  . Hepatitis A, Adult 09/20/2017, 04/05/2018  . Influenza Split 09/06/2011, 09/11/2012  . Influenza, High Dose Seasonal PF 10/17/2016, 09/03/2018  . Influenza,inj,Quad PF,6+ Mos 09/13/2013, 12/07/2015, 08/23/2017  . Pneumococcal Conjugate-13 10/17/2016  . Pneumococcal Polysaccharide-23 10/18/2017  . Tdap 09/02/2010  . Typhoid Inactivated 09/20/2017  . Zoster 02/05/2014  . Zoster Recombinat (Shingrix) 10/23/2018    Qualifies for Shingles Vaccine? Yes  Zostavax completed 02/05/14. Due for Shingrix. Education has been provided regarding the importance of this vaccine. Pt has been advised to call insurance company to determine out of pocket expense. Advised may also receive vaccine at local pharmacy or Health  Dept. Verbalized acceptance and understanding.  Tdap: Up to date  Flu Vaccine: Up to  date  Pneumococcal Vaccine: Up to date   Screening Tests Health Maintenance  Topic Date Due  . MAMMOGRAM  01/10/2020  . TETANUS/TDAP  09/02/2020  . COLONOSCOPY  03/21/2024  . INFLUENZA VACCINE  Completed  . DEXA SCAN  Completed  . Hepatitis C Screening  Completed  . PNA vac Low Risk Adult  Completed    Cancer Screenings:  Colorectal Screening: Completed 03/21/14. Repeat every 10 years.  Mammogram: Completed 01/09/18.   Bone Density: Completed 12/15/17. Results reflect OSTEOPENIA. Repeat every 2 years.   Lung Cancer Screening: (Low Dose CT Chest recommended if Age 68-80 years, 30 pack-year currently smoking OR have quit w/in 15years.) does not qualify.    Additional Screening:  Hepatitis C Screening: Up to date  Vision Screening: Recommended annual ophthalmology exams for early detection of glaucoma and other disorders of the eye.  Dental Screening: Recommended annual dental exams for proper oral hygiene  Community Resource Referral:  CRR required this visit?  No       Plan:  I have personally reviewed and addressed the Medicare Annual Wellness questionnaire and have noted the following in the patient's chart:  A. Medical and social history B. Use of alcohol, tobacco or illicit drugs  C. Current medications and supplements D. Functional ability and status E.  Nutritional status F.  Physical activity G. Advance directives H. List of other physicians I.  Hospitalizations, surgeries, and ER visits in previous 12 months J.  [place] such as hearing and vision if needed, cognitive and depression L. Referrals and appointments - none  In addition, I have reviewed and discussed with patient certain preventive protocols, quality metrics, and best practice recommendations. A written personalized care plan for preventive services as well as general preventive health recommendations were provided to patient.  See attached scanned questionnaire for additional  information.   Signed,  Fabio Neighbors, LPN Nurse Health Advisor   Nurse Recommendations: None.

## 2018-10-24 NOTE — Patient Instructions (Addendum)
Barbara Gates , Thank you for taking time to come for your Medicare Wellness Visit. I appreciate your ongoing commitment to your health goals. Please review the following plan we discussed and let me know if I can assist you in the future.   Screening recommendations/referrals: Colonoscopy: Up to date, due 03/2024 Mammogram: Up to date, due 12/2019 Bone Density: Up to date, due 11/2019 Recommended yearly ophthalmology/optometry visit for glaucoma screening and checkup Recommended yearly dental visit for hygiene and checkup  Vaccinations: Influenza vaccine: Up to date Pneumococcal vaccine: Completed series Tdap vaccine: Up to date, due 08/2020 Shingles vaccine: Pt declines today.     Advanced directives: Currently on file.  Conditions/risks identified: Recommend to exercise for at least 150 minutes per week.   Next appointment: 11/15/18 @ 2:00 PM with Fenton Malling.    Preventive Care 29 Years and Older, Female Preventive care refers to lifestyle choices and visits with your health care provider that can promote health and wellness. What does preventive care include?  A yearly physical exam. This is also called an annual well check.  Dental exams once or twice a year.  Routine eye exams. Ask your health care provider how often you should have your eyes checked.  Personal lifestyle choices, including:  Daily care of your teeth and gums.  Regular physical activity.  Eating a healthy diet.  Avoiding tobacco and drug use.  Limiting alcohol use.  Practicing safe sex.  Taking low-dose aspirin every day.  Taking vitamin and mineral supplements as recommended by your health care provider. What happens during an annual well check? The services and screenings done by your health care provider during your annual well check will depend on your age, overall health, lifestyle risk factors, and family history of disease. Counseling  Your health care provider may ask you  questions about your:  Alcohol use.  Tobacco use.  Drug use.  Emotional well-being.  Home and relationship well-being.  Sexual activity.  Eating habits.  History of falls.  Memory and ability to understand (cognition).  Work and work Statistician.  Reproductive health. Screening  You may have the following tests or measurements:  Height, weight, and BMI.  Blood pressure.  Lipid and cholesterol levels. These may be checked every 5 years, or more frequently if you are over 7 years old.  Skin check.  Lung cancer screening. You may have this screening every year starting at age 11 if you have a 30-pack-year history of smoking and currently smoke or have quit within the past 15 years.  Fecal occult blood test (FOBT) of the stool. You may have this test every year starting at age 76.  Flexible sigmoidoscopy or colonoscopy. You may have a sigmoidoscopy every 5 years or a colonoscopy every 10 years starting at age 70.  Hepatitis C blood test.  Hepatitis B blood test.  Sexually transmitted disease (STD) testing.  Diabetes screening. This is done by checking your blood sugar (glucose) after you have not eaten for a while (fasting). You may have this done every 1-3 years.  Bone density scan. This is done to screen for osteoporosis. You may have this done starting at age 40.  Mammogram. This may be done every 1-2 years. Talk to your health care provider about how often you should have regular mammograms. Talk with your health care provider about your test results, treatment options, and if necessary, the need for more tests. Vaccines  Your health care provider may recommend certain vaccines, such as:  Influenza  vaccine. This is recommended every year.  Tetanus, diphtheria, and acellular pertussis (Tdap, Td) vaccine. You may need a Td booster every 10 years.  Zoster vaccine. You may need this after age 64.  Pneumococcal 13-valent conjugate (PCV13) vaccine. One dose is  recommended after age 80.  Pneumococcal polysaccharide (PPSV23) vaccine. One dose is recommended after age 63. Talk to your health care provider about which screenings and vaccines you need and how often you need them. This information is not intended to replace advice given to you by your health care provider. Make sure you discuss any questions you have with your health care provider. Document Released: 12/04/2015 Document Revised: 07/27/2016 Document Reviewed: 09/08/2015 Elsevier Interactive Patient Education  2017 [place] Prevention in the Home Falls can cause injuries. They can happen to people of all ages. There are many things you can do to make your home safe and to help prevent falls. What can I do on the outside of my home?  Regularly fix the edges of walkways and driveways and fix any cracks.  Remove anything that might make you trip as you walk through a door, such as a raised step or threshold.  Trim any bushes or trees on the path to your home.  Use bright outdoor lighting.  Clear any walking paths of anything that might make someone trip, such as rocks or tools.  Regularly check to see if handrails are loose or broken. Make sure that both sides of any steps have handrails.  Any raised decks and porches should have guardrails on the edges.  Have any leaves, snow, or ice cleared regularly.  Use sand or salt on walking paths during winter.  Clean up any spills in your garage right away. This includes oil or grease spills. What can I do in the bathroom?  Use night lights.  Install grab bars by the toilet and in the tub and shower. Do not use towel bars as grab bars.  Use non-skid mats or decals in the tub or shower.  If you need to sit down in the shower, use a plastic, non-slip stool.  Keep the floor dry. Clean up any water that spills on the floor as soon as it happens.  Remove soap buildup in the tub or shower regularly.  Attach bath mats  securely with double-sided non-slip rug tape.  Do not have throw rugs and other things on the floor that can make you trip. What can I do in the bedroom?  Use night lights.  Make sure that you have a light by your bed that is easy to reach.  Do not use any sheets or blankets that are too big for your bed. They should not hang down onto the floor.  Have a firm chair that has side arms. You can use this for support while you get dressed.  Do not have throw rugs and other things on the floor that can make you trip. What can I do in the kitchen?  Clean up any spills right away.  Avoid walking on wet floors.  Keep items that you use a lot in easy-to-reach places.  If you need to reach something above you, use a strong step stool that has a grab bar.  Keep electrical cords out of the way.  Do not use floor polish or wax that makes floors slippery. If you must use wax, use non-skid floor wax.  Do not have throw rugs and other things on the floor that can  make you trip. What can I do with my stairs?  Do not leave any items on the stairs.  Make sure that there are handrails on both sides of the stairs and use them. Fix handrails that are broken or loose. Make sure that handrails are as long as the stairways.  Check any carpeting to make sure that it is firmly attached to the stairs. Fix any carpet that is loose or worn.  Avoid having throw rugs at the top or bottom of the stairs. If you do have throw rugs, attach them to the floor with carpet tape.  Make sure that you have a light switch at the top of the stairs and the bottom of the stairs. If you do not have them, ask someone to add them for you. What else can I do to help prevent falls?  Wear shoes that:  Do not have high heels.  Have rubber bottoms.  Are comfortable and fit you well.  Are closed at the toe. Do not wear sandals.  If you use a stepladder:  Make sure that it is fully opened. Do not climb a closed  stepladder.  Make sure that both sides of the stepladder are locked into place.  Ask someone to hold it for you, if possible.  Clearly mark and make sure that you can see:  Any grab bars or handrails.  First and last steps.  Where the edge of each step is.  Use tools that help you move around (mobility aids) if they are needed. These include:  Canes.  Walkers.  Scooters.  Crutches.  Turn on the lights when you go into a dark area. Replace any light bulbs as soon as they burn out.  Set up your furniture so you have a clear path. Avoid moving your furniture around.  If any of your floors are uneven, fix them.  If there are any pets around you, be aware of where they are.  Review your medicines with your doctor. Some medicines can make you feel dizzy. This can increase your chance of falling. Ask your doctor what other things that you can do to help prevent falls. This information is not intended to replace advice given to you by your health care provider. Make sure you discuss any questions you have with your health care provider. Document Released: 09/03/2009 Document Revised: 04/14/2016 Document Reviewed: 12/12/2014 Elsevier Interactive Patient Education  2017 [employer].

## 2018-11-15 ENCOUNTER — Encounter: Payer: Self-pay | Admitting: Physician Assistant

## 2018-11-15 ENCOUNTER — Ambulatory Visit (INDEPENDENT_AMBULATORY_CARE_PROVIDER_SITE_OTHER): Payer: Medicare Other | Admitting: Physician Assistant

## 2018-11-15 VITALS — BP 113/78 | HR 66 | Temp 98.6°F | Resp 16 | Ht 68.0 in | Wt 142.0 lb

## 2018-11-15 DIAGNOSIS — Z1239 Encounter for other screening for malignant neoplasm of breast: Secondary | ICD-10-CM

## 2018-11-15 DIAGNOSIS — M818 Other osteoporosis without current pathological fracture: Secondary | ICD-10-CM | POA: Diagnosis not present

## 2018-11-15 DIAGNOSIS — E7800 Pure hypercholesterolemia, unspecified: Secondary | ICD-10-CM

## 2018-11-15 DIAGNOSIS — R799 Abnormal finding of blood chemistry, unspecified: Secondary | ICD-10-CM

## 2018-11-15 DIAGNOSIS — Z0000 Encounter for general adult medical examination without abnormal findings: Secondary | ICD-10-CM | POA: Diagnosis not present

## 2018-11-15 DIAGNOSIS — E559 Vitamin D deficiency, unspecified: Secondary | ICD-10-CM | POA: Diagnosis not present

## 2018-11-15 DIAGNOSIS — Z803 Family history of malignant neoplasm of breast: Secondary | ICD-10-CM

## 2018-11-15 NOTE — Patient Instructions (Signed)
Health Maintenance After Age 67 After age 67, you are at a higher risk for certain long-term diseases and infections as well as injuries from falls. Falls are a major cause of broken bones and head injuries in people who are older than age 67. Getting regular preventive care can help to keep you healthy and well. Preventive care includes getting regular testing and making lifestyle changes as recommended by your health care provider. Talk with your health care provider about:  Which screenings and tests you should have. A screening is a test that checks for a disease when you have no symptoms.  A diet and exercise plan that is right for you. What should I know about screenings and tests to prevent falls? Screening and testing are the best ways to find a health problem early. Early diagnosis and treatment give you the best chance of managing medical conditions that are common after age 67. Certain conditions and lifestyle choices may make you more likely to have a fall. Your health care provider may recommend:  Regular vision checks. Poor vision and conditions such as cataracts can make you more likely to have a fall. If you wear glasses, make sure to get your prescription updated if your vision changes.  Medicine review. Work with your health care provider to regularly review all of the medicines you are taking, including over-the-counter medicines. Ask your health care provider about any side effects that may make you more likely to have a fall. Tell your health care provider if any medicines that you take make you feel dizzy or sleepy.  Osteoporosis screening. Osteoporosis is a condition that causes the bones to get weaker. This can make the bones weak and cause them to break more easily.  Blood pressure screening. Blood pressure changes and medicines to control blood pressure can make you feel dizzy.  Strength and balance checks. Your health care provider may recommend certain tests to check your  strength and balance while standing, walking, or changing positions.  Foot health exam. Foot pain and numbness, as well as not wearing proper footwear, can make you more likely to have a fall.  Depression screening. You may be more likely to have a fall if you have a fear of falling, feel emotionally low, or feel unable to do activities that you used to do.  Alcohol use screening. Using too much alcohol can affect your balance and may make you more likely to have a fall. What actions can I take to lower my risk of falls? General instructions  Talk with your health care provider about your risks for falling. Tell your health care provider if: ? You fall. Be sure to tell your health care provider about all falls, even ones that seem minor. ? You feel dizzy, sleepy, or off-balance.  Take over-the-counter and prescription medicines only as told by your health care provider. These include any supplements.  Eat a healthy diet and maintain a healthy weight. A healthy diet includes low-fat dairy products, low-fat (lean) meats, and fiber from whole grains, beans, and lots of fruits and vegetables. Home safety  Remove any tripping hazards, such as rugs, cords, and clutter.  Install safety equipment such as grab bars in bathrooms and safety rails on stairs.  Keep rooms and walkways well-lit. Activity   Follow a regular exercise program to stay fit. This will help you maintain your balance. Ask your health care provider what types of exercise are appropriate for you.  If you need a cane or   walker, use it as recommended by your health care provider.  Wear supportive shoes that have nonskid soles. Lifestyle  Do not drink alcohol if your health care provider tells you not to drink.  If you drink alcohol, limit how much you have: ? 0-1 drink a day for women. ? 0-2 drinks a day for men.  Be aware of how much alcohol is in your drink. In the [place] one drink equals one typical bottle of beer (12  oz), one-half glass of wine (5 oz), or one shot of hard liquor (1 oz).  Do not use any products that contain nicotine or tobacco, such as cigarettes and e-cigarettes. If you need help quitting, ask your health care provider. Summary  Having a healthy lifestyle and getting preventive care can help to protect your health and wellness after age 67.  Screening and testing are the best way to find a health problem early and help you avoid having a fall. Early diagnosis and treatment give you the best chance for managing medical conditions that are more common for people who are older than age 67.  Falls are a major cause of broken bones and head injuries in people who are older than age 67. Take precautions to prevent a fall at home.  Work with your health care provider to learn what changes you can make to improve your health and wellness and to prevent falls. This information is not intended to replace advice given to you by your health care provider. Make sure you discuss any questions you have with your health care provider. Document Released: 09/20/2017 Document Revised: 09/20/2017 Document Reviewed: 09/20/2017 Elsevier Interactive Patient Education  2019 [employer] Inc.  

## 2018-11-15 NOTE — Progress Notes (Signed)
Patient: Barbara Gates, Female    DOB: 1951-04-16, 67 y.o.   MRN: [id number] Visit Date: 11/15/2018  Today's Provider: Mar Daring, PA-C   Chief Complaint  Patient presents with  . Annual Exam   Subjective:     Complete Physical Barbara Gates is a 67 y.o. female. She feels well. She reports exercising 4 days. She reports she is sleeping fairly well.  10/18/17 CPE 01/09/18 Mammogram-BI-RADS 1 12/15/17 BMD-Osteopenia 04/22/14 Colonoscopy-recheck in 10 yrs ([facility]) -----------------------------------------------------------   Review of Systems  Constitutional: Negative.   HENT: Negative.   Eyes: Negative.   Respiratory: Positive for cough.   Cardiovascular: Negative.   Gastrointestinal: Negative.   Endocrine: Positive for cold intolerance and heat intolerance.  Genitourinary: Negative.   Musculoskeletal: Negative.   Skin: Negative.   Allergic/Immunologic: Negative.   Neurological: Negative.   Hematological: Negative.   Psychiatric/Behavioral: Negative.     Social History   Socioeconomic History  . Marital status: Married    Spouse name: Not on file  . Number of children: 0  . Years of education: Not on file  . Highest education level: Bachelor's degree (e.g., BA, AB, BS)  Occupational History  . Occupation: retired  Scientific laboratory technician  . Financial resource strain: Not hard at all  . Food insecurity:    Worry: Never true    Inability: Never true  . Transportation needs:    Medical: No    Non-medical: No  Tobacco Use  . Smoking status: Never Smoker  . Smokeless tobacco: Never Used  Substance and Sexual Activity  . Alcohol use: Yes    Alcohol/week: 1.0 standard drinks    Types: 1 Glasses of wine per week    Comment: margarita once a month  . Drug use: No  . Sexual activity: Not Currently    Birth control/protection: Surgical    Comment: Hysterecetomy  Lifestyle  . Physical activity:    Days per week: 1 day    Minutes  per session: 40 min  . Stress: Not at all  Relationships  . Social connections:    Talks on phone: Patient refused    Gets together: Patient refused    Attends religious service: Patient refused    Active member of club or organization: Patient refused    Attends meetings of clubs or organizations: Patient refused    Relationship status: Patient refused  . Intimate partner violence:    Fear of current or ex partner: Patient refused    Emotionally abused: Patient refused    Physically abused: Patient refused    Forced sexual activity: Patient refused  Other Topics Concern  . Not on file  Social History Narrative  . Not on file    Past Medical History:  Diagnosis Date  . Endometriosis   . 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 Fransisca Connors  . Hypercalcemia   . Hyperlipidemia    Has history of this  . Nodule of vagina   . Osteoporosis   . Osteoporosis      Patient Active Problem List   Diagnosis Date Noted  . Vulvar lesion 07/31/2018  . Nodule of tendon sheath 07/19/2018  . Chronic pain of both shoulders 04/05/2018  . Hypercalcemia 12/30/2015  . Memory impairment of gradual onset 12/30/2015  . Abnormal blood chemistry 06/11/2015  . Adult idiopathic generalized osteoporosis 06/11/2015  . Colon polyp 06/11/2015  . Closed fracture of proximal end of radius 06/11/2015  . Auditory impairment 06/11/2015  .  Blood in the urine 06/11/2015  . Hypercholesteremia 06/11/2015  . Gonalgia 06/11/2015  . Herpes zona 06/11/2015  . Female genuine stress incontinence 06/11/2015  . Chronic constipation 09/02/2014  . Lipoma of back-left 07/18/2012    Past Surgical History:  Procedure Laterality Date  . ABDOMINAL HYSTERECTOMY  1994   Total including ovarie. Due to endometriosis  . APPENDECTOMY     with hysterectomy   . BREAST BIOPSY Left 11/08/2012   Dr. Bary Castilla, neg  . granular cell tumor removed    . OTHER SURGICAL HISTORY    . Surgical Lipoma Removal; 12/13/2012  12/13/2012    . VULVAR LESION REMOVAL Left 08/22/2018   Procedure: Excision left vulvar mass, possible sentinel node mapping with blue dye, possible left inguinal lymph node biopsy or dissection;  Surgeon: Mellody Drown, MD;  Location: [facility] ORS;  Service: Gynecology;  Laterality: Left;    Her family history includes Arthritis in her sister; Breast cancer (age of onset: 62) in her sister; CAD in her father; Cancer in her mother; Diabetes in her brother, father, and mother; Dupuytren's contracture in her sister; Hyperlipidemia in her father and mother; Hypertension in her brother, father, mother, and sister; Parkinson's disease in her father.      Current Outpatient Medications:  .  BIOTIN 5000 PO, Take 5,000 mcg by mouth daily. , Disp: , Rfl:  .  L-Lysine HCl 500 MG TABS, Take 500 mg by mouth daily. , Disp: , Rfl:  .  Magnesium 250 MG TABS, Take 1 tablet by mouth daily., Disp: , Rfl:  .  Magnesium 400 MG CAPS, Take 1 Can by mouth daily., Disp: , Rfl:  .  Multiple Minerals-Vitamins (NUTRA-SUPPORT BONE PO), Take 2 tablets by mouth daily., Disp: , Rfl:  .  Multiple Vitamin (MULTIVITAMIN WITH MINERALS) TABS tablet, Take 1 tablet by mouth. Senior Multivitamin two days a week, Disp: , Rfl:  .  Multiple Vitamins-Minerals (EQL VISION FORMULA PO), Take 1 tablet by mouth daily. 50+, Disp: , Rfl:  .  Polyethylene Glycol 3350 (MIRALAX PO), Take 17 g by mouth daily. , Disp: , Rfl:  .  polyvinyl alcohol (LIQUIFILM TEARS) 1.4 % ophthalmic solution, Place 1 drop into both eyes 2 (two) times daily., Disp: , Rfl:  .  raloxifene (EVISTA) 60 MG tablet, Take 60 mg by mouth daily., Disp: , Rfl:  .  senna-docusate (SENOKOT-S) 8.6-50 MG tablet, Take 2 tablets by mouth every evening. , Disp: , Rfl:  .  Turmeric 450 MG CAPS, Take 500 mg by mouth every evening. , Disp: , Rfl:  .  vitamin B-12 (CYANOCOBALAMIN) 500 MCG tablet, Take 500 mcg by mouth daily. Quick dissolve (cherry), Disp: , Rfl:   Patient Care Team: Mar Daring, PA-C as PCP - General (Family Medicine) Brendolyn Patty, MD as Consulting Physician (Dermatology) Mellody Drown, MD as Referring Physician (Obstetrics and Gynecology)     Objective:   Vitals: BP 113/78 (BP Location: Left Arm, Patient Position: Sitting, Cuff Size: Normal)   Pulse 66   Temp 98.6 F (37 C) (Oral)   Resp 16   Ht 5\' 8"  (1.727 m)   Wt 142 lb (64.4 kg)   BMI 21.59 kg/m   Physical Exam Vitals signs reviewed.  Constitutional:      General: She is not in acute distress.    Appearance: She is well-developed and normal weight. She is not diaphoretic.  HENT:     Head: Normocephalic and atraumatic.     Right Ear: Hearing, tympanic  membrane, ear canal and external ear normal.     Left Ear: Hearing, tympanic membrane, ear canal and external ear normal.     Nose: Nose normal.     Mouth/Throat:     Lips: Pink.     Mouth: Mucous membranes are moist.     Dentition: Normal dentition.     Pharynx: Oropharynx is clear. Uvula midline. No oropharyngeal exudate or posterior oropharyngeal erythema.  Eyes:     General: No scleral icterus.       Right eye: No discharge.        Left eye: No discharge.     Conjunctiva/sclera: Conjunctivae normal.     Pupils: Pupils are equal, round, and reactive to light.  Neck:     Musculoskeletal: Normal range of motion and neck supple.     Thyroid: No thyromegaly.     Vascular: No JVD.     Trachea: No tracheal deviation.  Cardiovascular:     Rate and Rhythm: Normal rate and regular rhythm.     Heart sounds: Normal heart sounds. No murmur. No friction rub. No gallop.   Pulmonary:     Effort: Pulmonary effort is normal. No respiratory distress.     Breath sounds: Normal breath sounds. No wheezing or rales.  Chest:     Chest wall: No tenderness.     Breasts:        Right: Normal. No mass, skin change or tenderness.        Left: Normal. No mass, skin change or tenderness.  Abdominal:     General: Bowel sounds are normal. There is no  distension.     Palpations: Abdomen is soft. There is no mass.     Tenderness: There is no abdominal tenderness. There is no guarding or rebound.  Musculoskeletal: Normal range of motion.        General: No tenderness.  Lymphadenopathy:     Cervical: No cervical adenopathy.     Upper Body:     Right upper body: No supraclavicular, axillary or pectoral adenopathy.     Left upper body: No supraclavicular, axillary or pectoral adenopathy.  Skin:    General: Skin is warm and dry.     Findings: No rash.  Neurological:     Mental Status: She is alert and oriented to person, place, and time.  Psychiatric:        Behavior: Behavior normal.        Thought Content: Thought content normal.        Judgment: Judgment normal.     Activities of Daily Living In your present state of health, do you have any difficulty performing the following activities: 10/24/2018 08/10/2018  Hearing? Y N  Comment Around crowds. Does not wear hearing aids.  -  Vision? Y N  Comment Has cataracts on both eyes.  -  Difficulty concentrating or making decisions? N N  Walking or climbing stairs? N N  Dressing or bathing? N N  Doing errands, shopping? N N  Preparing Food and eating ? N -  Using the Toilet? N -  In the past six months, have you accidently leaked urine? N -  Do you have problems with loss of bowel control? N -  Managing your Medications? N -  Managing your Finances? N -  Housekeeping or managing your Housekeeping? N -  Some recent data might be hidden    Fall Risk Assessment Fall Risk  10/24/2018 10/18/2017 10/05/2016  Falls in the past year? 0  No Yes  Number falls in past yr: - - 2 or more  Injury with Fall? - - No     Depression Screen PHQ 2/9 Scores 10/24/2018 10/18/2017 10/05/2016  PHQ - 2 Score 0 0 0    6CIT Screen 10/18/2017  What Year? 0 points  What month? 0 points  What time? 0 points  Count back from 20 0 points  Months in reverse 0 points  Repeat phrase 0 points  Total  Score 0      Assessment & Plan:    Annual Physical Reviewed patient's Family Medical History Reviewed and updated list of patient's medical providers Assessment of cognitive impairment was done Assessed patient's functional ability Established a written schedule for health screening [place] Completed and Reviewed  Exercise Activities and Dietary recommendations Goals    . DIET - INCREASE WATER INTAKE     Continue drinking 6-8 glasses of water a day.     . Exercise 150 min/wk Moderate Activity     Recommend to exercise for at least 150 minutes per week.      . Increase water intake     Starting 10/05/16, I will continue to drink 4 glasses of water a day.       Immunization History  Administered Date(s) Administered  . Hepatitis A, Adult 09/20/2017, 04/05/2018  . Influenza Split 09/06/2011, 09/11/2012  . Influenza, High Dose Seasonal PF 10/17/2016, 09/03/2018  . Influenza,inj,Quad PF,6+ Mos 09/13/2013, 12/07/2015, 08/23/2017  . Pneumococcal Conjugate-13 10/17/2016  . Pneumococcal Polysaccharide-23 10/18/2017  . Tdap 09/02/2010  . Typhoid Inactivated 09/20/2017  . Zoster 02/05/2014  . Zoster Recombinat (Shingrix) 10/23/2018    Health Maintenance  Topic Date Due  . MAMMOGRAM  01/10/2020  . TETANUS/TDAP  09/02/2020  . COLONOSCOPY  03/21/2024  . INFLUENZA VACCINE  Completed  . DEXA SCAN  Completed  . Hepatitis C Screening  Completed  . PNA vac Low Risk Adult  Completed     Discussed health benefits of physical activity, and encouraged her to engage in regular exercise appropriate for her age and condition.    1. Annual physical exam Normal physical exam today. Will check labs as below and f/u pending lab results. Patient is going to stop all supplements to see what labs are without anything. If labs are stable and WNL she will not need to have these rechecked for one year at her next annual physical exam. She is to call the office in the  meantime if she has any acute issue, questions or concerns. - CBC w/Diff/Platelet - Comprehensive Metabolic Panel (CMET) - TSH - Lipid Profile - HgB A1c - Vitamin D (25 hydroxy)  2. Adult idiopathic generalized osteoporosis Improved some after pelvic floor PT. Pushes fluids, uses magnesium and miralax.  - CBC w/Diff/Platelet - Comprehensive Metabolic Panel (CMET) - TSH - Lipid Profile - HgB A1c - Vitamin D (25 hydroxy)  3. Abnormal blood chemistry Will check labs as below and f/u pending results. - CBC w/Diff/Platelet - Comprehensive Metabolic Panel (CMET) - TSH - Lipid Profile - HgB A1c - Vitamin D (25 hydroxy)  4. Hypercholesteremia Diet controlled. Will check labs as below and f/u pending results. - CBC w/Diff/Platelet - Comprehensive Metabolic Panel (CMET) - TSH - Lipid Profile - HgB A1c - Vitamin D (25 hydroxy)  5. Hypercalcemia Will check labs as below and f/u pending results. - CBC w/Diff/Platelet - Comprehensive Metabolic Panel (CMET) - TSH - Lipid Profile - HgB A1c - Vitamin D (25 hydroxy)  6. Vitamin D deficiency On OTC supplements. Will check labs as below and f/u pending results. - CBC w/Diff/Platelet - Comprehensive Metabolic Panel (CMET) - TSH - Lipid Profile - HgB A1c - Vitamin D (25 hydroxy)  ------------------------------------------------------------------------------------------------------------    Mar Daring, PA-C  [facility]

## 2018-11-22 DIAGNOSIS — Z0000 Encounter for general adult medical examination without abnormal findings: Secondary | ICD-10-CM | POA: Diagnosis not present

## 2018-11-22 DIAGNOSIS — M818 Other osteoporosis without current pathological fracture: Secondary | ICD-10-CM | POA: Diagnosis not present

## 2018-11-22 DIAGNOSIS — E559 Vitamin D deficiency, unspecified: Secondary | ICD-10-CM | POA: Diagnosis not present

## 2018-11-22 DIAGNOSIS — R799 Abnormal finding of blood chemistry, unspecified: Secondary | ICD-10-CM | POA: Diagnosis not present

## 2018-11-22 DIAGNOSIS — E7800 Pure hypercholesterolemia, unspecified: Secondary | ICD-10-CM | POA: Diagnosis not present

## 2018-11-23 LAB — COMPREHENSIVE METABOLIC PANEL
ALK PHOS: 66 IU/L (ref 39–117)
ALT: 16 IU/L (ref 0–32)
AST: 22 IU/L (ref 0–40)
Albumin/Globulin Ratio: 1.6 (ref 1.2–2.2)
Albumin: 4.3 g/dL (ref 3.6–4.8)
BUN/Creatinine Ratio: 15 (ref 12–28)
BUN: 13 mg/dL (ref 8–27)
Bilirubin Total: 0.4 mg/dL (ref 0.0–1.2)
CALCIUM: 9.5 mg/dL (ref 8.7–10.3)
CO2: 25 mmol/L (ref 20–29)
CREATININE: 0.85 mg/dL (ref 0.57–1.00)
Chloride: 102 mmol/L (ref 96–106)
GFR calc Af Amer: 82 mL/min/{1.73_m2} (ref >59)
GFR, EST NON AFRICAN AMERICAN: 71 mL/min/{1.73_m2} (ref >59)
Globulin, Total: 2.7 g/dL (ref 1.5–4.5)
Glucose: 88 mg/dL (ref 65–99)
POTASSIUM: 4.1 mmol/L (ref 3.5–5.2)
SODIUM: 141 mmol/L (ref 134–144)
Total Protein: 7 g/dL (ref 6.0–8.5)

## 2018-11-23 LAB — CBC WITH DIFFERENTIAL/PLATELET
BASOS ABS: 0 10*3/uL (ref 0.0–0.2)
Basos: 1 %
EOS (ABSOLUTE): 0.1 10*3/uL (ref 0.0–0.4)
EOS: 2 %
Hematocrit: 40.4 % (ref 34.0–46.6)
Hemoglobin: 13.7 g/dL (ref 11.1–15.9)
IMMATURE GRANULOCYTES: 0 %
Immature Grans (Abs): 0 10*3/uL (ref 0.0–0.1)
LYMPHS ABS: 2.6 10*3/uL (ref 0.7–3.1)
Lymphs: 47 %
MCH: 31.4 pg (ref 26.6–33.0)
MCHC: 33.9 g/dL (ref 31.5–35.7)
MCV: 92 fL (ref 79–97)
MONOS ABS: 0.5 10*3/uL (ref 0.1–0.9)
Monocytes: 8 %
NEUTROS PCT: 42 %
Neutrophils Absolute: 2.3 10*3/uL (ref 1.4–7.0)
PLATELETS: 223 10*3/uL (ref 150–450)
RBC: 4.37 x10E6/uL (ref 3.77–5.28)
RDW: 11.8 % — AB (ref 12.3–15.4)
WBC: 5.5 10*3/uL (ref 3.4–10.8)

## 2018-11-23 LAB — LIPID PANEL
CHOLESTEROL TOTAL: 207 mg/dL — AB (ref 100–199)
Chol/HDL Ratio: 2.5 ratio (ref 0.0–4.4)
HDL: 83 mg/dL (ref >39)
LDL CALC: 106 mg/dL — AB (ref 0–99)
TRIGLYCERIDES: 92 mg/dL (ref 0–149)
VLDL CHOLESTEROL CAL: 18 mg/dL (ref 5–40)

## 2018-11-23 LAB — VITAMIN D 25 HYDROXY (VIT D DEFICIENCY, FRACTURES): VIT D 25 HYDROXY: 33.7 ng/mL (ref 30.0–100.0)

## 2018-11-23 LAB — HEMOGLOBIN A1C
ESTIMATED AVERAGE GLUCOSE: 111 mg/dL
Hgb A1c MFr Bld: 5.5 % (ref 4.8–5.6)

## 2018-11-23 LAB — TSH: TSH: 3.24 u[IU]/mL (ref 0.450–4.500)

## 2018-11-26 ENCOUNTER — Telehealth: Payer: Self-pay

## 2018-11-26 NOTE — Telephone Encounter (Signed)
[id number] 

## 2018-11-26 NOTE — Telephone Encounter (Signed)
Patient advised as below. Patient does want to know why she has lost an inch in height and what she can do for that. Please advise. sd Patient reports that a message can be left on voice mail. sd

## 2018-11-26 NOTE — Telephone Encounter (Signed)
LMOVM for pt to return call 

## 2018-11-26 NOTE — Telephone Encounter (Signed)
-----   Message from Mar Daring, [place] sent at 11/25/2018  4:19 PM EST ----- Blood count is normal. Kidney and liver function are normal. Sugar is normal. Thyroid is normal. Cholesterol up slightly when compared to last year. No need for cholesterol lowering medications at this time. Just limit fatty foods, red meats and carbohydrates in diet. Vit D is normal range but lower normal. Do think continuing a Vit D supplement would be ok.

## 2018-11-26 NOTE — Telephone Encounter (Signed)
Could be osteoporosis, osteopenia, osteoarthritis, degenerative disc disease.   Staying a healty weight, eating a well balanced diet, using calcium and vit d supplements and doing weight bearing exercises such as walking and yoga can help prevent further lost.

## 2018-11-27 NOTE — Telephone Encounter (Signed)
Pt returned missed call.  Please call pt back. ° °Thanks, °T[facility] °

## 2018-11-27 NOTE — Telephone Encounter (Signed)
Left detailed massage.

## 2018-11-27 NOTE — Telephone Encounter (Signed)
Patient is returning your call again, she asks that you just leave detailed voicemail for her since she will not be able to get to her phone. KW

## 2018-12-08 ENCOUNTER — Other Ambulatory Visit: Payer: Self-pay | Admitting: Physician Assistant

## 2018-12-08 DIAGNOSIS — N9089 Other specified noninflammatory disorders of vulva and perineum: Secondary | ICD-10-CM

## 2018-12-12 ENCOUNTER — Other Ambulatory Visit: Payer: Self-pay | Admitting: Physician Assistant

## 2019-01-15 DIAGNOSIS — L578 Other skin changes due to chronic exposure to nonionizing radiation: Secondary | ICD-10-CM | POA: Diagnosis not present

## 2019-01-15 DIAGNOSIS — Z1283 Encounter for screening for malignant neoplasm of skin: Secondary | ICD-10-CM | POA: Diagnosis not present

## 2019-01-15 DIAGNOSIS — L812 Freckles: Secondary | ICD-10-CM | POA: Diagnosis not present

## 2019-01-15 DIAGNOSIS — L720 Epidermal cyst: Secondary | ICD-10-CM | POA: Diagnosis not present

## 2019-01-15 DIAGNOSIS — I8393 Asymptomatic varicose veins of bilateral lower extremities: Secondary | ICD-10-CM | POA: Diagnosis not present

## 2019-01-15 DIAGNOSIS — L821 Other seborrheic keratosis: Secondary | ICD-10-CM | POA: Diagnosis not present

## 2019-01-15 DIAGNOSIS — I781 Nevus, non-neoplastic: Secondary | ICD-10-CM | POA: Diagnosis not present

## 2019-01-15 DIAGNOSIS — D485 Neoplasm of uncertain behavior of skin: Secondary | ICD-10-CM | POA: Diagnosis not present

## 2019-01-15 DIAGNOSIS — D225 Melanocytic nevi of trunk: Secondary | ICD-10-CM | POA: Diagnosis not present

## 2019-01-15 DIAGNOSIS — D224 Melanocytic nevi of scalp and neck: Secondary | ICD-10-CM | POA: Diagnosis not present

## 2019-01-15 DIAGNOSIS — D229 Melanocytic nevi, unspecified: Secondary | ICD-10-CM | POA: Diagnosis not present

## 2019-01-15 DIAGNOSIS — L820 Inflamed seborrheic keratosis: Secondary | ICD-10-CM | POA: Diagnosis not present

## 2019-01-17 ENCOUNTER — Ambulatory Visit
Admission: RE | Admit: 2019-01-17 | Discharge: 2019-01-17 | Disposition: A | Discharge status: Home / Self Care | Payer: Medicare Other | Source: Ambulatory Visit | Attending: Physician Assistant | Admitting: Physician Assistant

## 2019-01-17 DIAGNOSIS — Z803 Family history of malignant neoplasm of breast: Secondary | ICD-10-CM | POA: Insufficient documentation

## 2019-01-17 DIAGNOSIS — Z1239 Encounter for other screening for malignant neoplasm of breast: Secondary | ICD-10-CM

## 2019-01-17 DIAGNOSIS — Z1231 Encounter for screening mammogram for malignant neoplasm of breast: Secondary | ICD-10-CM | POA: Insufficient documentation

## 2019-01-18 ENCOUNTER — Telehealth: Payer: Self-pay

## 2019-01-18 NOTE — Telephone Encounter (Signed)
Viewed by Lily Kocher on 01/18/2019 9:14 AM

## 2019-01-18 NOTE — Telephone Encounter (Signed)
[id number] 

## 2019-01-18 NOTE — Telephone Encounter (Signed)
-----   Message from Mar Daring, [place] sent at 01/18/2019  8:37 AM EST ----- Normal mammogram. Repeat screening in one year.

## 2019-03-22 ENCOUNTER — Telehealth: Payer: Self-pay

## 2019-03-22 NOTE — Telephone Encounter (Signed)
She had some questions about a bill she received. She would like you to call her.

## 2019-04-10 ENCOUNTER — Telehealth: Payer: Self-pay | Admitting: Pulmonary Disease

## 2019-04-10 NOTE — Telephone Encounter (Signed)
Nothing further needed 

## 2019-04-10 NOTE — Telephone Encounter (Signed)
Called patient for COVID-19 pre-screening for in office visit.  Have you recently traveled any where out of the local area in the last 2 weeks? NO  Have you been in close contact with a person diagnosed with COVID-19 within the last 2 weeks? NO  Do you currently have any of the following symptoms? If so, when did they start? Cough (YES- 3 years)     Diarrhea   Joint Pain Fever      Muscle Pain   Red eyes Shortness of breath   Abdominal pain  Vomiting Loss of smell    Rash    Sore Throat Headache    Weakness   Bruising or bleeding   chest congestion for 3 years also. Pt denies any other symptoms

## 2019-04-10 NOTE — Telephone Encounter (Signed)
Okay to proceed with appt.

## 2019-04-11 ENCOUNTER — Ambulatory Visit (INDEPENDENT_AMBULATORY_CARE_PROVIDER_SITE_OTHER): Payer: Medicare Other | Admitting: Pulmonary Disease

## 2019-04-11 ENCOUNTER — Other Ambulatory Visit: Payer: Self-pay

## 2019-04-11 ENCOUNTER — Ambulatory Visit
Admission: RE | Admit: 2019-04-11 | Discharge: 2019-04-11 | Disposition: A | Discharge status: Home / Self Care | Payer: Medicare Other | Source: Ambulatory Visit | Attending: Pulmonary Disease | Admitting: Pulmonary Disease

## 2019-04-11 ENCOUNTER — Ambulatory Visit: Payer: Medicare Other | Admitting: Pulmonary Disease

## 2019-04-11 ENCOUNTER — Encounter: Payer: Self-pay | Admitting: Pulmonary Disease

## 2019-04-11 VITALS — BP 138/82 | HR 69 | Temp 98.1°F | Ht 67.0 in | Wt 144.2 lb

## 2019-04-11 DIAGNOSIS — R059 Cough, unspecified: Secondary | ICD-10-CM

## 2019-04-11 DIAGNOSIS — R0600 Dyspnea, unspecified: Secondary | ICD-10-CM | POA: Diagnosis not present

## 2019-04-11 DIAGNOSIS — M818 Other osteoporosis without current pathological fracture: Secondary | ICD-10-CM | POA: Diagnosis not present

## 2019-04-11 DIAGNOSIS — R05 Cough: Secondary | ICD-10-CM | POA: Insufficient documentation

## 2019-04-11 DIAGNOSIS — R0609 Other forms of dyspnea: Secondary | ICD-10-CM

## 2019-04-11 DIAGNOSIS — R0602 Shortness of breath: Secondary | ICD-10-CM | POA: Diagnosis not present

## 2019-04-11 NOTE — Patient Instructions (Signed)
1.  Avoid eating meals for 2 to 3 hours prior to retiring.  If you need to lay on your side use the left side.  I suspect you are having some degree of reflux that then sensitizes your larynx.  2.  We will get a chest x-ray compared to your prior chest x-ray.  We will let you know the results.  3.  We will schedule you for breathing test to exclude potential cough variant asthma.  4.  I agree with your discontinuing the raloxifene (Evista) as this is a common cause for cough triggered by medication.  5.  We will see you in follow-up in 2 to 3 months time, please call us in the interim if you have any new issues.

## 2019-04-11 NOTE — Progress Notes (Signed)
Subjective:    Patient ID: Barbara Gates, female    DOB: [birth year], 68 y.o.   MRN: [id number]  HPI The patient is a very pleasant 68 year old lifelong never smoker who presents here for the issue of chronic cough.  She first presented to this practice on 01 June 2016 and was evaluated by Dr.Mungal.  She was instructed at that time to have pulmonary function tests, was prescribed a trial of Arnuity Ellipta and was told to return in 2 months for follow-up.  She never followed through with this.  She states that Dr. Stevenson Clinch tried to "fill her up with steroids" and she was worried about systemic effects due to her osteoporosis.  She has been on osteoporosis treatment for many years.  Of interest, at the time that she presented to see Dr. Stevenson Clinch she was on tamoxifen because she was a "high risk for breast cancer".  Subsequently this medication was discontinued as it was felt that it was causing her cough.  Was then placed on raloxifene (Evista) for her osteoporosis.  She has been on Evista until approximately 1 month ago.  She has noted that since she discontinued Evista that her cough is markedly better.  Of note Evista also has a side effect of cough.  We discussed this with the patient.  The patient notes that her cough is worse when she gets into a hot environment particularly a hot car.  She also notes that if she lies on her right side it is worse.  Eating also exacerbates it.  Initially she thought the cough was noted after an upper respiratory infection but recently this has not had any correlation with infection.  And as noted she believes that it is better since she discontinued the Evista.  She has not had any fevers, chills or sweats.  No sputum production and no hemoptysis.  She has not had pulmonary function testing performed.  Her most recent chest x-ray was in 2017.  As noted she is a lifelong never smoker.  Her past medical history, surgical history, social history has been reviewed.   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  She has since retired from these 2 occupations.  She currently works as a Psychologist, occupational at [employer].  Because of COVID-19 she has been pretty much housebound.  The patient did complain of dyspnea in 2017 but states that really mostly she notices it when she goes upstairs several flights or going uphill.  Most of the time she feels that she does well.  This has been of longstanding.  She is very concerned with regards to her osteoporosis and what can she take for this.  She has never seen a specialist.    Review of Systems  Constitutional: Negative.   HENT: Negative.   Eyes: Negative.   Respiratory: Positive for cough and shortness of breath.   Cardiovascular: Negative.   Gastrointestinal: Negative.   Endocrine: Negative.        Height loss due to osteoporosis  Genitourinary: Negative.   Musculoskeletal:       Height loss due to osteoporosis.  Skin: Negative.   Allergic/Immunologic: Negative.   Neurological: Negative.   Hematological: Negative.   Psychiatric/Behavioral: Negative.   All other systems reviewed and are negative.      Objective:   Physical Exam Vitals signs and nursing note reviewed.  Constitutional:      General: She is not in  acute distress.    Appearance: Normal appearance. She is normal weight. She is not ill-appearing.  HENT:     Head: Normocephalic and atraumatic.     Comments: Nose/mouth exam limited as the patient is wearing mask due to COVID-19 pandemic. Eyes:     General: No scleral icterus.    Conjunctiva/sclera: Conjunctivae normal.     Pupils: Pupils are equal, round, and reactive to light.  Neck:     Musculoskeletal: Neck supple.     Thyroid: No thyromegaly.     Trachea: Trachea and phonation normal.     Comments: Does not have wet voice. Cardiovascular:     Rate and Rhythm: Normal rate and regular rhythm.     Pulses: Normal pulses.     Heart  sounds: Normal heart sounds.  Pulmonary:     Effort: Pulmonary effort is normal.     Breath sounds: Normal breath sounds.  Abdominal:     General: Abdomen is flat. There is no distension.     Palpations: Abdomen is soft.  Musculoskeletal: Normal range of motion.     Right lower leg: No edema.     Left lower leg: No edema.  Lymphadenopathy:     Cervical: No cervical adenopathy.  Skin:    General: Skin is warm and dry.  Neurological:     General: No focal deficit present.     Mental Status: She is alert and oriented to person, place, and time.  Psychiatric:        Mood and Affect: Mood normal.        Behavior: Behavior normal.           Assessment & Plan:    1.  Cough: The patient has had issues with cough of longstanding.  Initially this was believed to have followed a viral upper respiratory infection i.e. postinfectious cough.  Individuals that have this type of issue can develop laryngeal sensitivity such as laryngeal sensory neuropathy.  This can make the larynx ultrasensitive and have an exaggerated cough reflex.  This occasionally requires Neurontin to abolish.  I have discussed with the patient that she has had cough of longstanding i.e. now for approximately 3 years and there is a degree of habituation with this as well.  The patient also has potential issues with laryngopharyngeal reflux as it is noted that her cough is worse when she lays on her right side or during meals.  She has been advised to follow strict antireflux measures to include not eating meals for 2 to 3 hours prior to retiring.  She has also advised that laying on the right side actually exacerbates reflux.  We will reevaluate with chest x-ray (to exclude potential parenchymal lesion or disease) and pulmonary function testing (to exclude potential cough variant asthma).  She has discontinued tamoxifen and Evista both of these medications have a side effect of cough.  She has noted improvement on her cough since  she discontinued Evista.  2.  Dyspnea on exertion: Also of longstanding and mostly during heavy exertion.  Pulmonary function testing should also help evaluate this issue.  3.  Osteoporosis: The patient appears to have a challenging case of osteoporosis given that he continues to have significant bone loss.  Would recommend that she see either endocrinology or rheumatology for evaluation and management of this issue.    4.  Laryngopharyngeal reflux: This issue adds complexity to her management.  She has been instructed on antireflux measures.  We will see the patient in  follow-up in 2 to 3 months time.  She is to contact us prior to that time should any new difficulties arise.   This chart was dictated using voice recognition software/Dragon.  Despite best efforts to proofread, errors can occur which can change the meaning.  Any change was purely unintentional.

## 2019-04-23 NOTE — Progress Notes (Signed)
Patient: Barbara Gates Female    DOB: Oct 26, 1951   68 y.o.   MRN: [id number] Visit Date: 04/25/2019  Today's Provider: Mar Daring, PA-C   Chief Complaint  Patient presents with  . Constipation   Subjective:     HPI   Patient is here to discuss multiple issues.  She would like to address chronic cough. She has been seen by Pulmonology for this on 04/11/19. It is suspected her cough is most likely secondary to possible laryngeal hypersensitivity and exacerbated as side effects of possible medications as well as laryngopharyngeal reflux. She underwent CXR which was normal and PFTs. She is to follow up with pulmonology in 2-3 months.   Also wanting to discuss constipation. Again this is a long standing issue for which she has had complete work up by GI. Was found to have outlet obstruction constipation. Completed pelvic floor therapy. Uses Magnesium, stool softener and miralax for BM. Reports with this regimen she has daily to every other day soft BM.  She also has a skin lesion on her back. It is an area that she cannot see, but reports it itches constantly. She has tried hydrocortisone cream and triamcinolone cream on it, as well as benadryl cream. Nothing has stopped the itching and she feels the skin lesion has grown in size.   She also complains of worsening hearing. Reports this has been slowly progressing but she is now ready to have her hearing checked. She states her husband complains of her saying "huh?" all the time.  Allergies  Allergen Reactions  . Tamoxifen Cough  . Mango Flavor Itching and Rash     Current Outpatient Medications:  .  BIOTIN 5000 PO, Take 5,000 mcg by mouth daily. , Disp: , Rfl:  .  Ca Phosphate-Cholecalciferol (CALCIUM 500 + D3) 250-500 MG-UNIT CHEW, , Disp: , Rfl:  .  cholecalciferol (VITAMIN D3) 25 MCG (1000 UT) tablet, , Disp: , Rfl:  .  L-Lysine HCl 500 MG TABS, Take 500 mg by mouth daily. , Disp: , Rfl:  .  Magnesium 250 MG  TABS, Take 1 tablet by mouth daily., Disp: , Rfl:  .  Magnesium 400 MG CAPS, Take 1 Can by mouth daily. 3x per week, Disp: , Rfl:  .  Multiple Minerals-Vitamins (NUTRA-SUPPORT BONE PO), Take by mouth., Disp: , Rfl:  .  Multiple Vitamins-Minerals (EQL VISION FORMULA PO), Take 1 tablet by mouth daily. 50+, Disp: , Rfl:  .  Polyethylene Glycol 3350 (MIRALAX PO), Take 17 g by mouth daily. , Disp: , Rfl:  .  polyvinyl alcohol (LIQUIFILM TEARS) 1.4 % ophthalmic solution, Place 1 drop into both eyes 2 (two) times daily., Disp: , Rfl:  .  senna-docusate (SENOKOT-S) 8.6-50 MG tablet, Take 2 tablets by mouth every evening. , Disp: , Rfl:  .  triamcinolone cream (KENALOG) 0.1 %, APPLY TO AFFECTED AREA TWICE A DAY, Disp: 30 g, Rfl: 0 .  Turmeric 450 MG CAPS, Take 500 mg by mouth every evening. , Disp: , Rfl:  .  vitamin B-12 (CYANOCOBALAMIN) 500 MCG tablet, Take 500 mcg by mouth daily. Quick dissolve (cherry), Disp: , Rfl:   Review of Systems  Constitutional: Negative for appetite change, chills, fatigue and fever.  HENT: Positive for hearing loss. Negative for congestion, ear discharge, ear pain, postnasal drip, rhinorrhea, sinus pressure, sinus pain, sneezing, sore throat and tinnitus.   Respiratory: Positive for cough. Negative for chest tightness and shortness of breath.  Cardiovascular: Negative for chest pain and palpitations.  Gastrointestinal: Positive for constipation. Negative for abdominal pain, nausea and vomiting.  Neurological: Negative for dizziness and weakness.    Social History   Tobacco Use  . Smoking status: Never Smoker  . Smokeless tobacco: Never Used  Substance Use Topics  . Alcohol use: Yes    Alcohol/week: 1.0 standard drinks    Types: 1 Glasses of wine per week    Comment: margarita once a month      Objective:   BP 140/72 (BP Location: Left Arm, Patient Position: Sitting, Cuff Size: Large)   Pulse (!) 59   Resp 16   Ht 5' 7.72" (1.72 m)   Wt 143 lb (64.9 kg)    SpO2 98%   BMI 21.93 kg/m  Vitals:   04/25/19 1106  BP: 140/72  Pulse: (!) 59  Resp: 16  SpO2: 98%  Weight: 143 lb (64.9 kg)  Height: 5' 7.72" (1.72 m)     Physical Exam Vitals signs reviewed.  Constitutional:      General: She is not in acute distress.    Appearance: Normal appearance. She is well-developed. She is not ill-appearing or diaphoretic.  HENT:     Head: Normocephalic and atraumatic.     Right Ear: Tympanic membrane, ear canal and external ear normal.     Left Ear: Tympanic membrane, ear canal and external ear normal.     Nose: Nose normal.     Mouth/Throat:     Mouth: Mucous membranes are moist.  Eyes:     General: No scleral icterus.    Extraocular Movements: Extraocular movements intact.     Pupils: Pupils are equal, round, and reactive to light.  Neck:     Musculoskeletal: Normal range of motion and neck supple.     Thyroid: No thyromegaly.     Vascular: No JVD.     Trachea: No tracheal deviation.  Cardiovascular:     Rate and Rhythm: Normal rate and regular rhythm.     Pulses: Normal pulses.     Heart sounds: Normal heart sounds. No murmur. No friction rub. No gallop.   Pulmonary:     Effort: Pulmonary effort is normal. No respiratory distress.     Breath sounds: Normal breath sounds. No wheezing or rales.  Abdominal:     General: Abdomen is flat. Bowel sounds are normal. There is no distension.     Palpations: Abdomen is soft.     Tenderness: There is no abdominal tenderness.  Lymphadenopathy:     Cervical: No cervical adenopathy.  Skin:    Findings: Rash present. Rash is papular (scaled border, annular, flat, pink).       Neurological:     Mental Status: She is alert.  Psychiatric:        Mood and Affect: Mood normal.        Behavior: Behavior normal.        Thought Content: Thought content normal.        Judgment: Judgment normal.        Assessment & Plan    1. Cough Lungs were CTA today. Cough suspected to be from laryngeal  hypersensitivity vs LPRD vs cough/asthmatic variant. She is scheduled to have PFTs checked. CXR has been normal. Patient attempted to show the "wheeze" after coughing today but it was a forced cough with her forcing air at the end of the cough that made a "wheezing" sound as more a vocal noise than what is an  actual wheeze. Will try albuterol inhaler for her "acute coughing spells" as below. Call if not helping. F/u with pulmonology in 2-3 months if needed.  - albuterol (VENTOLIN HFA) 108 (90 Base) MCG/ACT inhaler; Inhale 2 puffs into the lungs every 6 (six) hours as needed for wheezing or shortness of breath.  Dispense: 1 Inhaler; Refill: 0  2. Laryngopharyngeal reflux disease Strict reflux control. Avoid overeating and eating late. Discussed trying Pepcid OTC.   3. Skin lesion of back Multiple treatments tried but lesion is still itching without relief. Referral to dermatology for further evaluation.  - Ambulatory referral to Dermatology  4. Bilateral hearing loss, unspecified hearing loss type Per patient. Requesting hearing test and possibly hearing aids. Referral to ENT placed.  - Ambulatory referral to ENT  5. Constipation due to outlet dysfunction Stable currently with current regimen.      Mar Daring, PA-C  [facility]

## 2019-04-25 ENCOUNTER — Other Ambulatory Visit: Payer: Self-pay

## 2019-04-25 ENCOUNTER — Encounter: Payer: Self-pay | Admitting: Physician Assistant

## 2019-04-25 ENCOUNTER — Ambulatory Visit (INDEPENDENT_AMBULATORY_CARE_PROVIDER_SITE_OTHER): Payer: Medicare Other | Admitting: Physician Assistant

## 2019-04-25 VITALS — BP 140/72 | HR 59 | Resp 16 | Ht 67.72 in | Wt 143.0 lb

## 2019-04-25 DIAGNOSIS — R059 Cough, unspecified: Secondary | ICD-10-CM

## 2019-04-25 DIAGNOSIS — H9193 Unspecified hearing loss, bilateral: Secondary | ICD-10-CM

## 2019-04-25 DIAGNOSIS — R05 Cough: Secondary | ICD-10-CM

## 2019-04-25 DIAGNOSIS — K219 Gastro-esophageal reflux disease without esophagitis: Secondary | ICD-10-CM

## 2019-04-25 DIAGNOSIS — K5902 Outlet dysfunction constipation: Secondary | ICD-10-CM | POA: Diagnosis not present

## 2019-04-25 DIAGNOSIS — L989 Disorder of the skin and subcutaneous tissue, unspecified: Secondary | ICD-10-CM | POA: Diagnosis not present

## 2019-04-25 MED ORDER — ALBUTEROL SULFATE HFA 108 (90 BASE) MCG/ACT IN AERS: 2.0000 | INHALATION_SPRAY | Freq: Four times a day (QID) | RESPIRATORY_TRACT | 0 refills | Status: DC | PRN

## 2019-04-25 NOTE — Patient Instructions (Signed)
Albuterol inhalation powder What is this medicine? ALBUTEROL (al Normajean [place]) is a bronchodilator. It helps open up the airways in your lungs to make it easier to breathe. This medicine is used to treat and to prevent bronchospasm. This medicine may be used for other purposes; ask your health care provider or pharmacist if you have questions. COMMON BRAND NAME(S): ProAir digihaler, ProAir RespiClick What should I tell my health care provider before I take this medicine? They need to know if you have any of these conditions: -diabetes -heart disease or irregular heartbeat -high blood pressure -pheochromocytoma -seizures -thyroid disease -an unusual or allergic reaction to albuterol, levalbuterol, lactose, other medicines, foods, dyes, or preservatives -pregnant or trying to get pregnant -breast-feeding How should I use this medicine? This medicine is for inhalation through the mouth. Follow the directions on your prescription label. Take your medicine at regular intervals. Do not use more often than directed. Make sure that you are using your inhaler correctly. Ask you doctor or health care provider if you have any questions. Talk to your pediatrician regarding the use of this medicine in children. While this drug may be prescribed for children as young as 4 years for selected conditions, precautions do apply. Overdosage: If you think you have taken too much of this medicine contact a poison control center or emergency room at once. NOTE: This medicine is only for you. Do not share this medicine with others. What if I miss a dose? If you miss a dose, use it as soon as you can. If it is almost time for your next dose, use only that dose. Do not use double or extra doses. What may interact with this medicine? -anti-infectives like chloroquine and pentamidine -caffeine -cisapride -diuretics -medicines for colds -medicines for depression or for emotional or psychotic conditions -medicines  for weight loss including some herbal products -methadone -some antibiotics like clarithromycin, erythromycin, levofloxacin, and linezolid -some heart medicines -steroid hormones like dexamethasone, cortisone, hydrocortisone -theophylline -thyroid hormones This list may not describe all possible interactions. Give your health care provider a list of all the medicines, herbs, non-prescription drugs, or dietary supplements you use. Also tell them if you smoke, drink alcohol, or use illegal drugs. Some items may interact with your medicine. What should I watch for while using this medicine? Tell your doctor or health care professional if your symptoms do not improve. Do not use extra albuterol. If your asthma or bronchitis gets worse while you are using this medicine, call your doctor right away. What side effects may I notice from receiving this medicine? Side effects that you should report to your doctor or health care professional as soon as possible: -allergic reactions like skin rash, itching or hives, swelling of the face, lips, or tongue -breathing problems -chest pain -feeling faint or lightheaded, falls -high blood pressure -irregular heartbeat -fever -muscle cramps or weakness -pain, tingling, numbness in the hands or feet -vomiting Side effects that usually do not require medical attention (report to your doctor or health care professional if they continue or are bothersome): -changes in taste -cough -headache -nervousness or trembling -stomach upset -stuffy or runny nose -throat irritation -trouble sleeping This list may not describe all possible side effects. Call your doctor for medical advice about side effects. You may report side effects to FDA at 1-800-FDA-1088. Where should I keep my medicine? Keep out of the reach of children. Store at room temperature between 15 and 25 degrees C (59 and 77 degrees F). Do not  expose inhaler to extreme heat, cold, or humidity. Throw  away the inhaler 13 months after removing it from the foil pouch for the first time, when the dose counter displays "0", or after the expiration date on the package, whichever comes first. NOTE: This sheet is a summary. It may not cover all possible information. If you have questions about this medicine, talk to your doctor, pharmacist, or health care provider.  2019 Elsevier/Gold Standard (2015-12-10 10:58:43)

## 2019-05-02 ENCOUNTER — Telehealth: Payer: Self-pay

## 2019-05-02 NOTE — Telephone Encounter (Signed)
Patient called to check on the status of the referrals ordered last week. Please call patient and advise. She is already established with Dermatology Dr. Nicole Kindred. She says she can schedule her own dermatology appointment but needs the ENT scheduled. Please advise.

## 2019-05-09 DIAGNOSIS — K219 Gastro-esophageal reflux disease without esophagitis: Secondary | ICD-10-CM | POA: Diagnosis not present

## 2019-05-09 DIAGNOSIS — R1310 Dysphagia, unspecified: Secondary | ICD-10-CM | POA: Diagnosis not present

## 2019-05-09 DIAGNOSIS — R05 Cough: Secondary | ICD-10-CM | POA: Diagnosis not present

## 2019-05-09 DIAGNOSIS — H903 Sensorineural hearing loss, bilateral: Secondary | ICD-10-CM | POA: Diagnosis not present

## 2019-05-17 ENCOUNTER — Other Ambulatory Visit: Payer: Self-pay | Admitting: Physician Assistant

## 2019-05-17 DIAGNOSIS — R059 Cough, unspecified: Secondary | ICD-10-CM

## 2019-05-17 DIAGNOSIS — L988 Other specified disorders of the skin and subcutaneous tissue: Secondary | ICD-10-CM | POA: Diagnosis not present

## 2019-05-17 DIAGNOSIS — R05 Cough: Secondary | ICD-10-CM

## 2019-05-17 DIAGNOSIS — L812 Freckles: Secondary | ICD-10-CM | POA: Diagnosis not present

## 2019-05-17 DIAGNOSIS — D485 Neoplasm of uncertain behavior of skin: Secondary | ICD-10-CM | POA: Diagnosis not present

## 2019-06-19 ENCOUNTER — Other Ambulatory Visit: Payer: Self-pay

## 2019-06-21 ENCOUNTER — Other Ambulatory Visit: Payer: Self-pay

## 2019-07-04 ENCOUNTER — Telehealth: Payer: Self-pay | Admitting: Pulmonary Disease

## 2019-07-04 ENCOUNTER — Other Ambulatory Visit: Payer: Self-pay

## 2019-07-04 NOTE — Telephone Encounter (Signed)
Pt aware of date/time covid testing.  Nothing further is needed.

## 2019-07-08 ENCOUNTER — Other Ambulatory Visit: Payer: Medicare Other

## 2019-07-08 ENCOUNTER — Other Ambulatory Visit
Admission: RE | Admit: 2019-07-08 | Discharge: 2019-07-08 | Disposition: A | Discharge status: Home / Self Care | Payer: Medicare Other | Source: Ambulatory Visit | Attending: Pulmonary Disease | Admitting: Pulmonary Disease

## 2019-07-08 ENCOUNTER — Other Ambulatory Visit: Payer: Self-pay

## 2019-07-08 DIAGNOSIS — Z20828 Contact with and (suspected) exposure to other viral communicable diseases: Secondary | ICD-10-CM | POA: Diagnosis not present

## 2019-07-08 DIAGNOSIS — Z01812 Encounter for preprocedural laboratory examination: Secondary | ICD-10-CM | POA: Diagnosis not present

## 2019-07-09 LAB — SARS CORONAVIRUS 2 (TAT 6-24 HRS): SARS Coronavirus 2: NEGATIVE

## 2019-07-11 ENCOUNTER — Ambulatory Visit: Payer: Medicare Other | Attending: Pulmonary Disease

## 2019-07-11 ENCOUNTER — Other Ambulatory Visit: Payer: Self-pay

## 2019-07-11 DIAGNOSIS — R0609 Other forms of dyspnea: Secondary | ICD-10-CM | POA: Diagnosis not present

## 2019-07-11 DIAGNOSIS — R059 Cough, unspecified: Secondary | ICD-10-CM

## 2019-07-11 DIAGNOSIS — R05 Cough: Secondary | ICD-10-CM | POA: Insufficient documentation

## 2019-07-16 ENCOUNTER — Telehealth: Payer: Self-pay | Admitting: Pulmonary Disease

## 2019-07-16 NOTE — Telephone Encounter (Signed)
Called patient for COVID-19 pre-screening for in office visit.  Have you recently traveled any where out of the local area in the last 2 weeks? No  Have you been in close contact with a person diagnosed with COVID-19 or someone awaiting results within the last 2 weeks? No  Do you currently have any of the following symptoms? If so, when did they start? Cough (yes- years)   Diarrhea   Joint Pain Fever      Muscle Pain   Red eyes Shortness of breath (yes- years) Abdominal pain                      Vomiting Loss of smell    Rash    Sore Throat Headache    Weakness   Bruising or bleeding   Okay to proceed with visit. (date)  / Needs to reschedule visit. (date)

## 2019-07-16 NOTE — Telephone Encounter (Signed)
Okay to proceed.  

## 2019-07-17 ENCOUNTER — Ambulatory Visit: Payer: Medicare Other | Admitting: Pulmonary Disease

## 2019-07-17 ENCOUNTER — Other Ambulatory Visit: Payer: Self-pay

## 2019-07-17 ENCOUNTER — Encounter: Payer: Self-pay | Admitting: Pulmonary Disease

## 2019-07-17 VITALS — BP 126/76 | HR 61 | Temp 97.6°F | Ht 67.0 in | Wt 143.0 lb

## 2019-07-17 DIAGNOSIS — R059 Cough, unspecified: Secondary | ICD-10-CM

## 2019-07-17 NOTE — Progress Notes (Signed)
    Assessment & Plan:  There are no diagnoses linked to this encounter.  Patient Instructions  1.  Give a trial of Zyrtec (cetirizine) at bedtime.  2.  Continue to take Pepcid   3.  We will see him in follow-up in 2 months time.  Sooner should any new difficulties arise.  Please note: late entry documentation due to logistical difficulties during COVID-19 pandemic. This note is filed for information purposes only, and is not intended to be used for billing, nor does it represent the full scope/nature of the visit in question.   Subjective:    HPI: Barbara Gates is a 68 y.o. female presenting to the pulmonology clinic on 07/17/2019 with report of: Follow-up (cough is stil present. c/o wheezing and cough with deep breathing)     Outpatient Encounter Medications as of 07/17/2019  Medication Sig   senna-docusate (SENOKOT-S) 8.6-50 MG tablet Take 2 tablets by mouth every evening.    [DISCONTINUED] albuterol  (VENTOLIN  HFA) 108 (90 Base) MCG/ACT inhaler INHALE 2 PUFFS BY MOUTH EVERY 6 HOURS AS NEEDED FOR WHEEZE OR SHORTNESS OF BREATH   [DISCONTINUED] BIOTIN 5000 PO Take 5,000 mcg by mouth daily.    [DISCONTINUED] Ca Phosphate-Cholecalciferol (CALCIUM  500 + D3) 250-500 MG-UNIT CHEW    [DISCONTINUED] cholecalciferol (VITAMIN D3) 25 MCG (1000 UT) tablet    [DISCONTINUED] famotidine  (PEPCID ) 20 MG tablet Take 20 mg by mouth as needed.    [DISCONTINUED] L-Lysine HCl 500 MG TABS Take 500 mg by mouth daily.    [DISCONTINUED] Magnesium 250 MG TABS Take 2 tablets by mouth daily.    [DISCONTINUED] Multiple Minerals-Vitamins (NUTRA-SUPPORT BONE PO) Take by mouth.   [DISCONTINUED] Multiple Vitamins-Minerals (EQL VISION FORMULA PO) Take 1 tablet by mouth daily. 50+   [DISCONTINUED] Polyethylene Glycol 3350 (MIRALAX PO) Take 17 g by mouth daily.    [DISCONTINUED] polyvinyl alcohol (LIQUIFILM TEARS) 1.4 % ophthalmic solution Place 1 drop into both eyes 2 (two) times daily.   [DISCONTINUED]  triamcinolone  cream (KENALOG ) 0.1 % APPLY TO AFFECTED AREA TWICE A DAY   [DISCONTINUED] Turmeric 450 MG CAPS Take 550 mg by mouth every other day.   [DISCONTINUED] vitamin B-12 (CYANOCOBALAMIN ) 500 MCG tablet Take 500 mcg by mouth daily. Quick dissolve (cherry)   [DISCONTINUED] Coenzyme Q10 10 MG capsule Take by mouth.   [DISCONTINUED] Magnesium 400 MG CAPS Take 1 Can by mouth daily. 3x per week   [DISCONTINUED] Multiple Vitamin (MULTI-VITAMIN) tablet Take by mouth.   No facility-administered encounter medications on file as of 07/17/2019.      Objective:   Vitals:   07/17/19 1043  BP: 126/76  Pulse: 61  Temp: 97.6 F (36.4 C)  Height: 5' 7 (1.702 m)  Weight: 143 lb (64.9 kg)  SpO2: 98%  TempSrc: Temporal  BMI (Calculated): 22.39     Physical exam documentation is limited by delayed entry of information.

## 2019-07-17 NOTE — Patient Instructions (Signed)
1.  Give a trial of Zyrtec (cetirizine) at bedtime.  2.  Continue to take Pepcid  3.  We will see him in follow-up in 2 months time.  Sooner should any new difficulties arise.

## 2019-07-31 DIAGNOSIS — Z23 Encounter for immunization: Secondary | ICD-10-CM | POA: Diagnosis not present

## 2019-08-16 DIAGNOSIS — H40013 Open angle with borderline findings, low risk, bilateral: Secondary | ICD-10-CM | POA: Diagnosis not present

## 2019-08-16 DIAGNOSIS — H02403 Unspecified ptosis of bilateral eyelids: Secondary | ICD-10-CM | POA: Diagnosis not present

## 2019-08-16 DIAGNOSIS — H04123 Dry eye syndrome of bilateral lacrimal glands: Secondary | ICD-10-CM | POA: Diagnosis not present

## 2019-08-16 DIAGNOSIS — H43813 Vitreous degeneration, bilateral: Secondary | ICD-10-CM | POA: Diagnosis not present

## 2019-08-16 DIAGNOSIS — Z83511 Family history of glaucoma: Secondary | ICD-10-CM | POA: Diagnosis not present

## 2019-08-16 DIAGNOSIS — H2513 Age-related nuclear cataract, bilateral: Secondary | ICD-10-CM | POA: Diagnosis not present

## 2019-10-15 ENCOUNTER — Other Ambulatory Visit: Payer: Self-pay

## 2019-10-28 NOTE — Progress Notes (Addendum)
Subjective:   Barbara Gates is a 68 y.o. female who presents for Medicare Annual (Subsequent) preventive examination.    This visit is being conducted through telemedicine due to the COVID-19 pandemic. This patient has given me verbal consent via doximity to conduct this visit, patient states they are participating from their home address. Some vital signs may be absent or patient reported.    Patient identification: identified by name, DOB, and current address  Review of Systems:  N/A  Cardiac Risk Factors include: advanced age (>73men, >11 women)     Objective:     Vitals: BP 133/70 (BP Location: Right Arm)   Pulse (!) 55   Temp 97.7 F (36.5 C) (Oral)   Ht 5\' 8"  (1.727 m)   Wt 139 lb (63 kg)   BMI 21.13 kg/m   Body mass index is 21.13 kg/m. Vitals were taken by patient at home.   Advanced Directives 10/29/2019 10/24/2018 09/19/2018 08/22/2018 08/10/2018 08/01/2018 11/22/2017  Does Patient Have a Medical Advance Directive? Yes Yes Yes Yes Yes Yes Yes  Type of Paramedic of [place];Living will [place];Living will [place];Living will [place];Living will - [place];Living will -  Does patient want to make changes to medical advance directive? - - No - Patient declined No - Patient declined - No - Patient declined -  Copy of [place] in Chart? Yes - validated most recent copy scanned in chart (See row information) Yes - validated most recent copy scanned in chart (See row information) Yes Yes Yes Yes -    Tobacco Social History   Tobacco Use  Smoking Status Never Smoker  Smokeless Tobacco Never Used     Counseling given: Not Answered   Clinical Intake:  Pre-visit preparation completed: Yes  Pain : No/denies pain Pain Score: 0-No pain     Nutritional Risks: None Diabetes: No  How often do you need to have someone help you when you  read instructions, pamphlets, or other written materials from your doctor or pharmacy?: 1 - Never  Interpreter Needed?: No  Information entered by :: [facility], LPN  Past Medical History:  Diagnosis Date  . Endometriosis   . 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 Fransisca Connors  . Hypercalcemia   . Hyperlipidemia    Has history of this  . Nodule of vagina   . Osteoporosis   . Osteoporosis    Past Surgical History:  Procedure Laterality Date  . ABDOMINAL HYSTERECTOMY  1994   Total including ovarie. Due to endometriosis  . APPENDECTOMY     with hysterectomy   . BREAST BIOPSY Left 11/08/2012   Dr. Bary Castilla, neg  . granular cell tumor removed    . OTHER SURGICAL HISTORY    . Surgical Lipoma Removal; 12/13/2012  12/13/2012  . VULVAR LESION REMOVAL Left 08/22/2018   Procedure: Excision left vulvar mass, possible sentinel node mapping with blue dye, possible left inguinal lymph node biopsy or dissection;  Surgeon: Mellody Drown, MD;  Location: [facility] ORS;  Service: Gynecology;  Laterality: Left;   Family History  Problem Relation Age of Onset  . Cancer Mother        lung, pancreas, melanoma, carcinoid tumors  . Hypertension Mother   . Hyperlipidemia Mother   . Diabetes Mother   . Hypertension Father   . Diabetes Father   . Hyperlipidemia Father   . Parkinson's disease Father   .  CAD Father   . Diabetes Brother   . Hypertension Brother   . Breast cancer Sister 75  . Arthritis Sister   . Hypertension Sister   . Dupuytren's contracture Sister    Social History   Socioeconomic History  . Marital status: Married    Spouse name: Not on file  . Number of children: 0  . Years of education: Not on file  . Highest education level: Bachelor's degree (e.g., BA, AB, BS)  Occupational History  . Occupation: retired  Scientific laboratory technician  . Financial resource strain: Not hard at all  . Food insecurity    Worry: Never true    Inability: Never true  . Transportation needs    Medical: No     Non-medical: No  Tobacco Use  . Smoking status: Never Smoker  . Smokeless tobacco: Never Used  Substance and Sexual Activity  . Alcohol use: Yes    Alcohol/week: 1.0 standard drinks    Types: 1 Glasses of wine per week    Comment: 1/2 margarita once a month  . Drug use: No  . Sexual activity: Not Currently    Birth control/protection: Surgical    Comment: Hysterecetomy  Lifestyle  . Physical activity    Days per week: 3 days    Minutes per session: 40 min  . Stress: Only a little  Relationships  . Social Herbalist on phone: Patient refused    Gets together: Patient refused    Attends religious service: Patient refused    Active member of club or organization: Patient refused    Attends meetings of clubs or organizations: Patient refused    Relationship status: Patient refused  Other Topics Concern  . Not on file  Social History Narrative  . Not on file    Outpatient Encounter Medications as of 10/29/2019  Medication Sig  . BIOTIN 5000 PO Take 5,000 mcg by mouth daily.   . Carboxymethylcellulose Sodium (THERATEARS OP) Apply 1 capsule to eye daily.  . Cholecalciferol (VITAMIN D3) 50 MCG (2000 UT) capsule Take 2,000 Units by mouth daily.  Marland Kitchen ELDERBERRY PO Take by mouth daily. With Zinc  . L-Lysine HCl 500 MG TABS Take 500 mg by mouth daily.   . Magnesium 250 MG TABS Take 2 tablets by mouth daily.   . multivitamin-lutein (OCUVITE-LUTEIN) CAPS capsule Take 1 capsule by mouth daily.  . NON FORMULARY 500 mg daily. Bone Up calcium 1000 mg  . Phosphatidylserine 100 MG CAPS Take 300 mg by mouth 2 (two) times daily.  . Polyethylene Glycol 3350 (MIRALAX PO) Take 17 g by mouth daily.   . polyvinyl alcohol (LIQUIFILM TEARS) 1.4 % ophthalmic solution Place 1 drop into both eyes 2 (two) times daily.  Marland Kitchen senna-docusate (SENOKOT-S) 8.6-50 MG tablet Take 2 tablets by mouth every evening.   . Turmeric 450 MG CAPS Take 550 mg by mouth every evening.   . vitamin B-12  (CYANOCOBALAMIN) 500 MCG tablet Take 500 mcg by mouth daily. Quick dissolve (cherry)  . albuterol (VENTOLIN HFA) 108 (90 Base) MCG/ACT inhaler INHALE 2 PUFFS BY MOUTH EVERY 6 HOURS AS NEEDED FOR WHEEZE OR SHORTNESS OF BREATH  . Ca Phosphate-Cholecalciferol (CALCIUM 500 + D3) 250-500 MG-UNIT CHEW   . cholecalciferol (VITAMIN D3) 25 MCG (1000 UT) tablet   . Coenzyme Q10 10 MG capsule Take by mouth.  . famotidine (PEPCID) 20 MG tablet Take 20 mg by mouth daily.  . Multiple Vitamin (MULTI-VITAMIN) tablet Take by mouth.  Marland Kitchen  Multiple Vitamins-Minerals (EQL VISION FORMULA PO) Take 1 tablet by mouth daily. 50+  . triamcinolone cream (KENALOG) 0.1 % APPLY TO AFFECTED AREA TWICE A DAY   No facility-administered encounter medications on file as of 10/29/2019.     Activities of Daily Living In your present state of health, do you have any difficulty performing the following activities: 10/29/2019  Hearing? N  Vision? N  Difficulty concentrating or making decisions? N  Walking or climbing stairs? N  Dressing or bathing? N  Doing errands, shopping? N  Preparing Food and eating ? N  Using the Toilet? N  In the past six months, have you accidently leaked urine? N  Do you have problems with loss of bowel control? N  Managing your Medications? N  Managing your Finances? N  Housekeeping or managing your Housekeeping? N  Some recent data might be hidden    Patient Care Team: Mar Daring, PA-C as PCP - General (Family Medicine) Brendolyn Patty, MD as Consulting Physician (Dermatology) Tyler Pita, MD as Consulting Physician (Pulmonary Disease) Clyde Canterbury, MD as Referring Physician (Otolaryngology)    Assessment:   This is a routine wellness examination for Barbara Gates.  Exercise Activities and Dietary recommendations Current Exercise Habits: Structured exercise class, Type of exercise: treadmill;stretching;strength training/weights;walking, Time (Minutes): 45, Frequency (Times/Week):  4, Weekly Exercise (Minutes/Week): 180, Intensity: Mild, Exercise limited by: None identified  Goals    . DIET - INCREASE WATER INTAKE     Continue drinking 6-8 glasses of water a day.     . Exercise 150 min/wk Moderate Activity     Recommend to exercise for at least 150 minutes per week.         Fall Risk: Fall Risk  10/29/2019 10/24/2018 10/18/2017 10/05/2016  Falls in the past year? 0 0 No Yes  Number falls in past yr: 0 - - 2 or more  Injury with Fall? 0 - - No    FALL RISK PREVENTION PERTAINING TO THE HOME:  Any stairs in or around the home? Yes  If so, are there any without handrails? No   Home free of loose throw rugs in walkways, pet beds, electrical cords, etc? Yes  Adequate lighting in your home to reduce risk of falls? Yes   ASSISTIVE DEVICES UTILIZED TO PREVENT FALLS:  Life alert? No  Use of a cane, walker or w/c? No  Grab bars in the bathroom? No  Shower chair or bench in shower? No  Elevated toilet seat or a handicapped toilet? Yes    TIMED UP AND GO:  Was the test performed? No .    Depression Screen PHQ 2/9 Scores 10/29/2019 10/24/2018 10/18/2017 10/05/2016  PHQ - 2 Score 0 0 0 0     Cognitive Function: Declined today.      6CIT Screen 10/18/2017 10/05/2016  What Year? 0 points 0 points  What month? 0 points 0 points  What time? 0 points 0 points  Count back from 20 0 points 0 points  Months in reverse 0 points 0 points  Repeat phrase 0 points 0 points  Total Score 0 0    Immunization History  Administered Date(s) Administered  . Hepatitis A, Adult 09/20/2017, 04/05/2018  . Influenza Split 09/06/2011, 09/11/2012  . Influenza, High Dose Seasonal PF 10/17/2016, 09/03/2018  . Influenza,inj,Quad PF,6+ Mos 09/13/2013, 12/07/2015, 08/23/2017  . Pneumococcal Conjugate-13 10/17/2016  . Pneumococcal Polysaccharide-23 10/18/2017  . Tdap 09/02/2010  . Typhoid Inactivated 09/20/2017  . Zoster 02/05/2014  . Zoster  Recombinat (Shingrix) 10/23/2018,  03/20/2019    Qualifies for Shingles Vaccine? Completed series  Tdap: Up to date  Flu Vaccine: Up to date  Pneumococcal Vaccine: Completed series  Screening Tests Health Maintenance  Topic Date Due  . DEXA SCAN  12/16/2019  . TETANUS/TDAP  09/02/2020  . MAMMOGRAM  01/17/2021  . COLONOSCOPY  03/21/2024  . INFLUENZA VACCINE  Completed  . Hepatitis C Screening  Completed  . PNA vac Low Risk Adult  Completed    Cancer Screenings:  Colorectal Screening: Completed 03/21/14. Repeat every 10 years.   Mammogram: Completed 01/17/19.   Bone Density: Completed 12/15/17. Results reflect OSTEOPENIA. Repeat every 2 years.   Lung Cancer Screening: (Low Dose CT Chest recommended if Age 67-80 years, 30 pack-year currently smoking OR have quit w/in 15years.) does not qualify.   Additional Screening:  Hepatitis C Screening: Up to date  Vision Screening: Recommended annual ophthalmology exams for early detection of glaucoma and other disorders of the eye.  Dental Screening: Recommended annual dental exams for proper oral hygiene  Community Resource Referral:  CRR required this visit?  No       Plan:  I have personally reviewed and addressed the Medicare Annual Wellness questionnaire and have noted the following in the patient's chart:  A. Medical and social history B. Use of alcohol, tobacco or illicit drugs  C. Current medications and supplements D. Functional ability and status E.  Nutritional status F.  Physical activity G. Advance directives H. List of other physicians I.  Hospitalizations, surgeries, and ER visits in previous 12 months J.  [place] such as hearing and vision if needed, cognitive and depression L. Referrals and appointments   In addition, I have reviewed and discussed with patient certain preventive protocols, quality metrics, and best practice recommendations. A written personalized care plan for preventive services as well as general preventive  health recommendations were provided to patient. Nurse Health Advisor  Signed,    Vance Hochmuth [place], [place]  [phone] Nurse Health Advisor   Nurse Notes: None.

## 2019-10-29 ENCOUNTER — Ambulatory Visit (INDEPENDENT_AMBULATORY_CARE_PROVIDER_SITE_OTHER): Payer: Medicare Other

## 2019-10-29 ENCOUNTER — Other Ambulatory Visit: Payer: Self-pay

## 2019-10-29 VITALS — BP 133/70 | HR 55 | Temp 97.7°F | Ht 68.0 in | Wt 139.0 lb

## 2019-10-29 DIAGNOSIS — Z0000 Encounter for general adult medical examination without abnormal findings: Secondary | ICD-10-CM

## 2019-10-29 NOTE — Patient Instructions (Signed)
Barbara Gates , Thank you for taking time to come for your Medicare Wellness Visit. I appreciate your ongoing commitment to your health goals. Please review the following plan we discussed and let me know if I can assist you in the future.   Screening recommendations/referrals: Colonoscopy: Up to date, due 03/2024 Mammogram: Up to date, due 12/2020 Bone Density: Up to date, due 11/2019 Recommended yearly ophthalmology/optometry visit for glaucoma screening and checkup Recommended yearly dental visit for hygiene and checkup  Vaccinations: Influenza vaccine: Up to date Pneumococcal vaccine: Completed series Tdap vaccine: Up to date, due 08/2020 Shingles vaccine: Completed series    Advanced directives: Currently on file.   Conditions/risks identified: None.   Next appointment: 11/02/20 @ 11:00 for an AWV. Declined scheduling a follow up with PCP at this time.    Preventive Care 70 Years and Older, Female Preventive care refers to lifestyle choices and visits with your health care provider that can promote health and wellness. What does preventive care include?  A yearly physical exam. This is also called an annual well check.  Dental exams once or twice a year.  Routine eye exams. Ask your health care provider how often you should have your eyes checked.  Personal lifestyle choices, including:  Daily care of your teeth and gums.  Regular physical activity.  Eating a healthy diet.  Avoiding tobacco and drug use.  Limiting alcohol use.  Practicing safe sex.  Taking low-dose aspirin every day.  Taking vitamin and mineral supplements as recommended by your health care provider. What happens during an annual well check? The services and screenings done by your health care provider during your annual well check will depend on your age, overall health, lifestyle risk factors, and family history of disease. Counseling  Your health care provider may ask you questions about your:   Alcohol use.  Tobacco use.  Drug use.  Emotional well-being.  Home and relationship well-being.  Sexual activity.  Eating habits.  History of falls.  Memory and ability to understand (cognition).  Work and work Statistician.  Reproductive health. Screening  You may have the following tests or measurements:  Height, weight, and BMI.  Blood pressure.  Lipid and cholesterol levels. These may be checked every 5 years, or more frequently if you are over 68 years old.  Skin check.  Lung cancer screening. You may have this screening every year starting at age 47 if you have a 30-pack-year history of smoking and currently smoke or have quit within the past 15 years.  Fecal occult blood test (FOBT) of the stool. You may have this test every year starting at age 47.  Flexible sigmoidoscopy or colonoscopy. You may have a sigmoidoscopy every 5 years or a colonoscopy every 10 years starting at age 31.  Hepatitis C blood test.  Hepatitis B blood test.  Sexually transmitted disease (STD) testing.  Diabetes screening. This is done by checking your blood sugar (glucose) after you have not eaten for a while (fasting). You may have this done every 1-3 years.  Bone density scan. This is done to screen for osteoporosis. You may have this done starting at age 73.  Mammogram. This may be done every 1-2 years. Talk to your health care provider about how often you should have regular mammograms. Talk with your health care provider about your test results, treatment options, and if necessary, the need for more tests. Vaccines  Your health care provider may recommend certain vaccines, such as:  Influenza vaccine.  This is recommended every year.  Tetanus, diphtheria, and acellular pertussis (Tdap, Td) vaccine. You may need a Td booster every 10 years.  Zoster vaccine. You may need this after age 67.  Pneumococcal 13-valent conjugate (PCV13) vaccine. One dose is recommended after age  71.  Pneumococcal polysaccharide (PPSV23) vaccine. One dose is recommended after age 86. Talk to your health care provider about which screenings and vaccines you need and how often you need them. This information is not intended to replace advice given to you by your health care provider. Make sure you discuss any questions you have with your health care provider. Document Released: 12/04/2015 Document Revised: 07/27/2016 Document Reviewed: 09/08/2015 Elsevier Interactive Patient Education  2017 [place] Prevention in the Home Falls can cause injuries. They can happen to people of all ages. There are many things you can do to make your home safe and to help prevent falls. What can I do on the outside of my home?  Regularly fix the edges of walkways and driveways and fix any cracks.  Remove anything that might make you trip as you walk through a door, such as a raised step or threshold.  Trim any bushes or trees on the path to your home.  Use bright outdoor lighting.  Clear any walking paths of anything that might make someone trip, such as rocks or tools.  Regularly check to see if handrails are loose or broken. Make sure that both sides of any steps have handrails.  Any raised decks and porches should have guardrails on the edges.  Have any leaves, snow, or ice cleared regularly.  Use sand or salt on walking paths during winter.  Clean up any spills in your garage right away. This includes oil or grease spills. What can I do in the bathroom?  Use night lights.  Install grab bars by the toilet and in the tub and shower. Do not use towel bars as grab bars.  Use non-skid mats or decals in the tub or shower.  If you need to sit down in the shower, use a plastic, non-slip stool.  Keep the floor dry. Clean up any water that spills on the floor as soon as it happens.  Remove soap buildup in the tub or shower regularly.  Attach bath mats securely with double-sided  non-slip rug tape.  Do not have throw rugs and other things on the floor that can make you trip. What can I do in the bedroom?  Use night lights.  Make sure that you have a light by your bed that is easy to reach.  Do not use any sheets or blankets that are too big for your bed. They should not hang down onto the floor.  Have a firm chair that has side arms. You can use this for support while you get dressed.  Do not have throw rugs and other things on the floor that can make you trip. What can I do in the kitchen?  Clean up any spills right away.  Avoid walking on wet floors.  Keep items that you use a lot in easy-to-reach places.  If you need to reach something above you, use a strong step stool that has a grab bar.  Keep electrical cords out of the way.  Do not use floor polish or wax that makes floors slippery. If you must use wax, use non-skid floor wax.  Do not have throw rugs and other things on the floor that can make  you trip. What can I do with my stairs?  Do not leave any items on the stairs.  Make sure that there are handrails on both sides of the stairs and use them. Fix handrails that are broken or loose. Make sure that handrails are as long as the stairways.  Check any carpeting to make sure that it is firmly attached to the stairs. Fix any carpet that is loose or worn.  Avoid having throw rugs at the top or bottom of the stairs. If you do have throw rugs, attach them to the floor with carpet tape.  Make sure that you have a light switch at the top of the stairs and the bottom of the stairs. If you do not have them, ask someone to add them for you. What else can I do to help prevent falls?  Wear shoes that:  Do not have high heels.  Have rubber bottoms.  Are comfortable and fit you well.  Are closed at the toe. Do not wear sandals.  If you use a stepladder:  Make sure that it is fully opened. Do not climb a closed stepladder.  Make sure that both  sides of the stepladder are locked into place.  Ask someone to hold it for you, if possible.  Clearly mark and make sure that you can see:  Any grab bars or handrails.  First and last steps.  Where the edge of each step is.  Use tools that help you move around (mobility aids) if they are needed. These include:  Canes.  Walkers.  Scooters.  Crutches.  Turn on the lights when you go into a dark area. Replace any light bulbs as soon as they burn out.  Set up your furniture so you have a clear path. Avoid moving your furniture around.  If any of your floors are uneven, fix them.  If there are any pets around you, be aware of where they are.  Review your medicines with your doctor. Some medicines can make you feel dizzy. This can increase your chance of falling. Ask your doctor what other things that you can do to help prevent falls. This information is not intended to replace advice given to you by your health care provider. Make sure you discuss any questions you have with your health care provider. Document Released: 09/03/2009 Document Revised: 04/14/2016 Document Reviewed: 12/12/2014 Elsevier Interactive Patient Education  2017 [employer].

## 2019-11-27 ENCOUNTER — Ambulatory Visit: Payer: Self-pay | Admitting: *Deleted

## 2019-11-27 NOTE — Telephone Encounter (Signed)
Calls with episode of upper abdominal pain last night and again this morning.Last night after swallowing a tablet, felt like it was stuck going down. Took acid tablet pain resolved after several hours. Denies sweating/dizziness/pain did not radiate/SOB. Had nausea for a brief time. This morning, same type mid to uppper abdominal pain the resolved when she repositioned-No other symptoms. Voids without difficulty/small BM yesterday. Able to eat/drink without difficulty. Offerred appointment for tomorrow morning and evening-she declined. Reviewed urgent symptoms requiring immediate evaluation. Stated understanding.   Reason for Disposition . [1] MILD pain (e.g., does not interfere with normal activities) AND [2] comes and goes (cramps) AND [3] present > 72 hours  Answer Assessment - Initial Assessment Questions 1. LOCATION: "Where does it hurt?"     Mid abdominal pain below the breast2. RADIATION: "Does the pain shoot anywhere else?" (e.g., chest, back)     No shooting pain 3. ONSET: "When did the pain begin?" (e.g., minutes, hours or days ago)      Began yeserday 4. SUDDEN: "Gradual or sudden onset?"    sudden 5. PATTERN "Does the pain come and go, or is it constant?"    - If constant: "Is it getting better, staying the same, or worsening?"      (Note: Constant means the pain never goes away completely; most serious pain is constant and it progresses)     - If intermittent: "How long does it last?" "Do you have pain now?"     (Note: Intermittent means the pain goes away completely between bouts)     Comes and goes 6. SEVERITY: "How bad is the pain?"  (e.g., Scale 1-10; mild, moderate, or severe)    - MILD (1-3): doesn't interfere with normal activities, abdomen soft and not tender to touch     - MODERATE (4-7): interferes with normal activities or awakens from sleep, tender to touch     - SEVERE (8-10): excruciating pain, doubled over, unable to do any normal activities       3 7. RECURRENT  SYMPTOM: "Have you ever had this type of abdominal pain before?" If so, ask: "When was the last time?" and "What happened that time?"      no 8. AGGRAVATING FACTORS: "Does anything seem to cause this pain?" (e.g., foods, stress, alcohol)     positisional 9. CARDIAC SYMPTOMS: "Do you have any of the following symptoms: chest pain, difficulty breathing, sweating, nausea?"    nausea 10. OTHER SYMPTOMS: "Do you have any other symptoms?" (e.g., fever, vomiting, diarrhea)       no 11. PREGNANCY: "Is there any chance you are pregnant?" "When was your last menstrual period?"       na  Protocols used: ABDOMINAL PAIN - UPPER-A-AH

## 2019-12-02 ENCOUNTER — Ambulatory Visit: Payer: Self-pay | Admitting: *Deleted

## 2019-12-02 NOTE — Telephone Encounter (Signed)
Pt called with wanting to discuss with her provider when is the time to get an epi pen. She stated last week she develop a rash and had abd pain. Was not sure if it was an allergic reaction.  She thought she had an allergic reaction to a medication that she had used on her cat called Revolution. And in reading the warning on it, it advised her to  use gloves. It has an ingredient in it  that could cause an allergic reaction to humans like what she experienced. The ingredient is selamectin and a preservative of BHT. So she stated that it did not affect her breathing. But she did have problems swallowing some of her medication. A large pill  she swallowed,felt like it was going down sideways.  This was last week. So she is interesting in getting an epi pen and to talk about the ingredients in the covid 19 vaccine. To see if it is safe for her to take. Advised pt that her provider would go over her allergies with her and once given the injection, she would be watched for at least 15 mins.  She requested an appointment to discuss these things. Please contact pt regarding a virtual vs an office visit. appointment scheduled for Wednesday.  Reason for Disposition . Requesting regular office appointment  Answer Assessment - Initial Assessment Questions 1. REASON FOR CALL or QUESTION: "What is your reason for calling today?" or "How can I best help you?" or "What question do you have that I can help answer?"     advice  Protocols used: INFORMATION ONLY CALL-A-AH

## 2019-12-03 ENCOUNTER — Encounter: Payer: Self-pay | Admitting: Family Medicine

## 2019-12-03 NOTE — Telephone Encounter (Signed)
FYI

## 2019-12-03 NOTE — Progress Notes (Signed)
Patient: Barbara Gates Female    DOB: [birth year]   69 y.o.   MRN: [id number] Visit Date: 12/03/2019  Today's Provider: Lavon Paganini, MD   Chief Complaint  Patient presents with  . Rash   Subjective:    I [place] S. Dimas, CMA, am acting as scribe for Lavon Paganini, MD. Virtual Visit via Video Note  I connected with DELANA MOLOCK on 12/03/19 at 10:40 AM EST by a video enabled telemedicine application and verified that I am speaking with the correct person using two identifiers.  Location:  Patient location: home Provider location: [facility] Persons involved in the visit: patient, provider    I discussed the limitations of evaluation and management by telemedicine and the availability of in person appointments. The patient expressed understanding and agreed to proceed.   HPI Patient wanting to go over recommendation for COVID 19 Vaccine. Patient reports that last week she used a medication on her cat and she had a reaction. Patient reports she had hives on several areas of her body, and a sensation of having "a hug pill stuck in her esophagus." Patient reports she was able to eat. Patient reports that she took Claritin as soon as hives started. Patient reports that during the night she still had the feeling of "a big pill being stuck". Patient reports that symptom lasted about 15 hours. Patient reports the medication she used on her cat is called Revolution Plus. Patient reports that she may be allergic to an ingredient selamectin.  States that she talk to the manufacturer and the veterinarian and they did not know anything about this, but there is a message on the box that warns that some people may have an allergic reaction to this ingredient.  Symptoms went away and she is feeling back to baseline  Gets scratchy throat around other cats and dogs.  Had issues with thimerosol as a preservative in eye drop in the past.  Also has allergies to  tamoxifen, mangoes, Raloxifen.    He is worried about getting the COVID-19 vaccine as she has heard of people having anaphylaxis from this who has had issues with medications in the past.  She has never had anaphylaxis to any of the medications she is allergic to.  She takes MiraLAX daily without any problems.  She does not carry an EpiPen.  She also believes that she is due for DEXA and mammogram.  She has been taking calcium for many years for her osteopenia.  She also reports she is seeing pulmonology for chronic cough and reports some shortness of breath with exertion that is chronic and stable.   Allergies  Allergen Reactions  . Other Hives    selamectin active ingredient in Revolution Plus for cats.  . Raloxifene Hcl     cough  . Tamoxifen Cough  . Mango Flavor Itching and Rash     Current Outpatient Medications:  .  Ascorbic Acid (VITAMIN C) 500 MG CAPS, , Disp: , Rfl:  .  BIOTIN 5000 PO, Take 5,000 mcg by mouth daily. , Disp: , Rfl:  .  Carboxymethylcellulose Sodium (THERATEARS OP), Apply 1 capsule to eye daily., Disp: , Rfl:  .  Cholecalciferol (VITAMIN D3) 50 MCG (2000 UT) capsule, Take 2,000 Units by mouth daily., Disp: , Rfl:  .  ELDERBERRY PO, Take by mouth daily. With Zinc, Disp: , Rfl:  .  L-Lysine HCl 500 MG TABS, Take 500 mg by mouth daily. , Disp: ,  Rfl:  .  Magnesium 250 MG TABS, Take 2 tablets by mouth daily. , Disp: , Rfl:  .  multivitamin-lutein (OCUVITE-LUTEIN) CAPS capsule, Take 1 capsule by mouth daily., Disp: , Rfl:  .  NON FORMULARY, 500 mg daily. Bone Up calcium 1000 mg, Disp: , Rfl:  .  Phosphatidylserine 100 MG CAPS, Take 300 mg by mouth 2 (two) times daily., Disp: , Rfl:  .  Polyethylene Glycol 3350 (MIRALAX PO), Take 17 g by mouth daily. , Disp: , Rfl:  .  polyvinyl alcohol (LIQUIFILM TEARS) 1.4 % ophthalmic solution, Place 1 drop into both eyes 2 (two) times daily., Disp: , Rfl:  .  senna-docusate (SENOKOT-S) 8.6-50 MG tablet, Take 2 tablets by mouth  every evening. , Disp: , Rfl:  .  Turmeric 450 MG CAPS, Take 550 mg by mouth every evening. , Disp: , Rfl:  .  vitamin B-12 (CYANOCOBALAMIN) 500 MCG tablet, Take 500 mcg by mouth daily. Quick dissolve (cherry), Disp: , Rfl:  .  famotidine (PEPCID) 20 MG tablet, Take 20 mg by mouth as needed. , Disp: , Rfl:   Review of Systems  Constitutional: Negative for chills and fever.  Respiratory: Negative for apnea, cough, choking, chest tightness and shortness of breath.   Cardiovascular: Negative for chest pain.  Skin: Negative for rash.    Social History   Tobacco Use  . Smoking status: Never Smoker  . Smokeless tobacco: Never Used  Substance Use Topics  . Alcohol use: Yes    Alcohol/week: 1.0 standard drinks    Types: 1 Glasses of wine per week    Comment: 1/2 margarita once a month      Objective:   Ht 5\' 8"  (1.727 m)   Wt 139 lb (63 kg)   BMI 21.13 kg/m  Vitals:   12/03/19 1422  Weight: 139 lb (63 kg)  Height: 5\' 8"  (1.727 m)  Body mass index is 21.13 kg/m.   Physical Exam Constitutional:      General: She is not in acute distress.    Appearance: Normal appearance. She is not diaphoretic.  HENT:     Head: Normocephalic and atraumatic.  Eyes:     General: No scleral icterus.    Conjunctiva/sclera: Conjunctivae normal.  Pulmonary:     Effort: Pulmonary effort is normal. No respiratory distress.  Neurological:     Mental Status: She is alert and oriented to person, place, and time. Mental status is at baseline.  Psychiatric:        Mood and Affect: Mood normal.        Behavior: Behavior normal.      No results found for any visits on 12/04/19.     Assessment & Plan    I discussed the assessment and treatment plan with the patient. The patient was provided an opportunity to ask questions and all were answered. The patient agreed with the plan and demonstrated an understanding of the instructions.   The patient was advised to call back or seek an in-person  evaluation if the symptoms worsen or if the condition fails to improve as anticipated.  Total time spent on today's visit was greater than 30 minutes, including  nonface-to-face time personally spent on visit with patient, as well as on review of chart (labs and imaging), discussing labs and goals, discussing further work-up, treatment options, referrals to specialist if needed, reviewing outside records of pertinent, answering patient's questions, and coordinating care.    1. Allergic reaction, initial encounter -Patient with  history of intolerance to multiple medications -Seems to developed hives possibly related to her cats topical medication recently -She is back to baseline at this time -We did discuss that she would be an okay candidate for the COVID-19 vaccine, when her phase is up, as she has no history of anaphylaxis -Did discuss being monitored for 30 minutes rather than 15 minutes after getting the vaccine -Patient elects to see allergy and immunology to discuss if there is anything that she can do to prevent further allergic reactions -Referral placed today -Return precautions discussed - Ambulatory referral to Allergy  2. Osteopenia of multiple sites -Longstanding issue -Continue weightbearing exercise and calcium/vitamin D supplementation -Repeat DEXA scan every 2 years -Order placed today - DG Bone Density; Future  3. Encounter for screening mammogram for malignant neoplasm of breast -Discussed 1 to 2-year interval on screening mammograms -Ordered mammogram today -Patient advised to call [facility] and schedule this - MM 3D SCREEN BREAST BILATERAL; Future  Other orders - Ascorbic Acid (VITAMIN C) 500 MG CAPS   Follow-up as needed  The entirety of the information documented in the History of Present Illness, Review of Systems and Physical Exam were personally obtained by me. Portions of this information were initially documented by Lynford Humphrey, CMA and reviewed by me  for thoroughness and accuracy.    Kase Shughart, Dionne Bucy, MD MPH [facility]

## 2019-12-04 ENCOUNTER — Telehealth (INDEPENDENT_AMBULATORY_CARE_PROVIDER_SITE_OTHER): Payer: Medicare Other | Admitting: Family Medicine

## 2019-12-04 ENCOUNTER — Encounter: Payer: Self-pay | Admitting: Family Medicine

## 2019-12-04 VITALS — Ht 68.0 in | Wt 139.0 lb

## 2019-12-04 DIAGNOSIS — T7840XA Allergy, unspecified, initial encounter: Secondary | ICD-10-CM

## 2019-12-04 DIAGNOSIS — M8589 Other specified disorders of bone density and structure, multiple sites: Secondary | ICD-10-CM | POA: Diagnosis not present

## 2019-12-04 DIAGNOSIS — Z1231 Encounter for screening mammogram for malignant neoplasm of breast: Secondary | ICD-10-CM | POA: Diagnosis not present

## 2019-12-17 ENCOUNTER — Ambulatory Visit: Payer: Medicare Other | Admitting: Pulmonary Disease

## 2019-12-17 DIAGNOSIS — R05 Cough: Secondary | ICD-10-CM

## 2019-12-17 DIAGNOSIS — R059 Cough, unspecified: Secondary | ICD-10-CM

## 2019-12-17 DIAGNOSIS — R0602 Shortness of breath: Secondary | ICD-10-CM

## 2019-12-17 NOTE — Progress Notes (Signed)
    Assessment & Plan:  1. SOB (shortness of breath) (Primary) - ECHOCARDIOGRAM COMPLETE; Future  2. Cough   Patient Instructions  We will get an echocardiogram to evaluate your heart  We will see you in follow-up in 2 months time  Please note: late entry documentation due to logistical difficulties during COVID-19 pandemic. This note is filed for information purposes only, and is not intended to be used for billing, nor does it represent the full scope/nature of the visit in question. Please see any associated scanned media linked to date of encounter for additional pertinent information.  Subjective:    HPI: Barbara Gates is a 69 y.o. female presenting to the pulmonology clinic on 12/17/2019 with report of: No chief complaint on file.     Outpatient Encounter Medications as of 12/17/2019  Medication Sig   senna-docusate (SENOKOT-S) 8.6-50 MG tablet Take 2 tablets by mouth every evening.    [DISCONTINUED] Ascorbic Acid (VITAMIN C) 500 MG CAPS    [DISCONTINUED] BIOTIN 5000 PO Take 5,000 mcg by mouth daily.    [DISCONTINUED] Carboxymethylcellulose Sodium (THERATEARS OP) Apply 1 capsule to eye daily.   [DISCONTINUED] Cholecalciferol (VITAMIN D3) 50 MCG (2000 UT) capsule Take 2,000 Units by mouth daily.   [DISCONTINUED] ELDERBERRY PO Take by mouth daily. With Zinc   [DISCONTINUED] famotidine  (PEPCID ) 20 MG tablet Take 20 mg by mouth as needed.    [DISCONTINUED] L-Lysine HCl 500 MG TABS Take 500 mg by mouth daily.    [DISCONTINUED] Magnesium 250 MG TABS Take 2 tablets by mouth daily.    [DISCONTINUED] multivitamin-lutein (OCUVITE-LUTEIN) CAPS capsule Take 1 capsule by mouth daily.   [DISCONTINUED] NON FORMULARY 500 mg daily. [place]'s Bone Up calcium  1000 mg (Patient not taking: Reported on 09/02/2020)   [DISCONTINUED] Phosphatidylserine 100 MG CAPS Take 300 mg by mouth 2 (two) times daily.   [DISCONTINUED] Polyethylene Glycol 3350 (MIRALAX PO) Take 17 g by mouth daily.     [DISCONTINUED] polyvinyl alcohol (LIQUIFILM TEARS) 1.4 % ophthalmic solution Place 1 drop into both eyes 2 (two) times daily.   [DISCONTINUED] Turmeric 450 MG CAPS Take 550 mg by mouth every other day.   [DISCONTINUED] vitamin B-12 (CYANOCOBALAMIN ) 500 MCG tablet Take 500 mcg by mouth daily. Quick dissolve (cherry)   No facility-administered encounter medications on file as of 12/17/2019.      Objective:   There were no vitals filed for this visit.   Physical exam documentation is limited by delayed entry of information.

## 2019-12-17 NOTE — Patient Instructions (Signed)
We will get an echocardiogram to evaluate your heart  We will see you in follow-up in 2 months time

## 2020-01-02 ENCOUNTER — Ambulatory Visit: Payer: Medicare Other

## 2020-01-02 ENCOUNTER — Telehealth: Payer: Self-pay

## 2020-01-02 NOTE — Telephone Encounter (Signed)
Copied from [place] [id number]. Topic: General - Inquiry >> Jan 02, 2020 11:19 AM Mathis Bud wrote: Reason for CRM: Mickel Baas called from [facility] stating patient denied allergy test. Call back [phone]

## 2020-01-03 ENCOUNTER — Ambulatory Visit: Payer: Medicare Other | Attending: Internal Medicine

## 2020-01-03 DIAGNOSIS — Z23 Encounter for immunization: Secondary | ICD-10-CM | POA: Insufficient documentation

## 2020-01-03 NOTE — Progress Notes (Signed)
   Covid-19 Vaccination Clinic  Name:  Barbara Gates    MRN: [id number] DOB: Jul 29, 1951  01/03/2020  Ms. Raudenbush was observed post Covid-19 immunization for 15 minutes without incidence. She was provided with Vaccine Information Sheet and instruction to access the V-Safe system.   Ms. Gales was instructed to call 911 with any severe reactions post vaccine: Marland Kitchen Difficulty breathing  . Swelling of your face and throat  . A fast heartbeat  . A bad rash all over your body  . Dizziness and weakness    Immunizations Administered    Name Date Dose VIS Date Route   Pfizer COVID-19 Vaccine 01/03/2020  9:15 AM 0.3 mL 11/01/2019 Intramuscular   Manufacturer: [place]   Lot: [id number]   [place]: [id number]

## 2020-01-07 ENCOUNTER — Ambulatory Visit
Admission: RE | Admit: 2020-01-07 | Discharge: 2020-01-07 | Disposition: A | Discharge status: Home / Self Care | Payer: Medicare Other | Source: Ambulatory Visit | Attending: Pulmonary Disease | Admitting: Pulmonary Disease

## 2020-01-07 ENCOUNTER — Other Ambulatory Visit: Payer: Self-pay

## 2020-01-07 DIAGNOSIS — E785 Hyperlipidemia, unspecified: Secondary | ICD-10-CM | POA: Insufficient documentation

## 2020-01-07 DIAGNOSIS — R0600 Dyspnea, unspecified: Secondary | ICD-10-CM | POA: Insufficient documentation

## 2020-01-07 DIAGNOSIS — R0602 Shortness of breath: Secondary | ICD-10-CM

## 2020-01-07 NOTE — Progress Notes (Signed)
*  PRELIMINARY RESULTS* Echocardiogram 2D Echocardiogram has been performed.  Barbara Gates 01/07/2020, 11:44 AM

## 2020-01-08 ENCOUNTER — Telehealth: Payer: Self-pay

## 2020-01-08 NOTE — Telephone Encounter (Signed)
Copied from [place] [id number]. Topic: General - Other >> Jan 07, 2020  4:32 PM Percell Belt A wrote: Reason for CRM: [facility] called in and pt is trying to call them to sch a DEXA scan  (routine scan) . They would like to know if we could fax over an order .  Fax number [phone] Phone number [phone]

## 2020-01-08 NOTE — Telephone Encounter (Signed)
This is ok to order.

## 2020-01-10 NOTE — Telephone Encounter (Signed)
Faxed to  [facility] imaging

## 2020-01-13 NOTE — Telephone Encounter (Signed)
Patient called in stating she called imaging center and they have not received it. Please advise as patient would like this to be resent and called when received.

## 2020-01-14 NOTE — Telephone Encounter (Signed)
Order for bone density has been faxed to [facility] several times.I went ahead and scheduled appointment for 01/20/20 at 11:00 and left meesage as to date and time on patient's VM

## 2020-01-14 NOTE — Telephone Encounter (Signed)
Patient calling back regarding this. She is requesting a call back.

## 2020-01-21 DIAGNOSIS — L814 Other melanin hyperpigmentation: Secondary | ICD-10-CM | POA: Diagnosis not present

## 2020-01-21 DIAGNOSIS — D224 Melanocytic nevi of scalp and neck: Secondary | ICD-10-CM | POA: Diagnosis not present

## 2020-01-21 DIAGNOSIS — L578 Other skin changes due to chronic exposure to nonionizing radiation: Secondary | ICD-10-CM | POA: Diagnosis not present

## 2020-01-21 DIAGNOSIS — Z1283 Encounter for screening for malignant neoplasm of skin: Secondary | ICD-10-CM | POA: Diagnosis not present

## 2020-01-21 DIAGNOSIS — L853 Xerosis cutis: Secondary | ICD-10-CM | POA: Diagnosis not present

## 2020-01-21 DIAGNOSIS — R234 Changes in skin texture: Secondary | ICD-10-CM | POA: Diagnosis not present

## 2020-01-21 DIAGNOSIS — L820 Inflamed seborrheic keratosis: Secondary | ICD-10-CM | POA: Diagnosis not present

## 2020-01-21 DIAGNOSIS — D225 Melanocytic nevi of trunk: Secondary | ICD-10-CM | POA: Diagnosis not present

## 2020-01-24 DIAGNOSIS — M85851 Other specified disorders of bone density and structure, right thigh: Secondary | ICD-10-CM | POA: Diagnosis not present

## 2020-01-24 DIAGNOSIS — M8588 Other specified disorders of bone density and structure, other site: Secondary | ICD-10-CM | POA: Diagnosis not present

## 2020-01-24 DIAGNOSIS — M810 Age-related osteoporosis without current pathological fracture: Secondary | ICD-10-CM | POA: Diagnosis not present

## 2020-01-24 DIAGNOSIS — Z780 Asymptomatic menopausal state: Secondary | ICD-10-CM | POA: Diagnosis not present

## 2020-01-24 DIAGNOSIS — M8589 Other specified disorders of bone density and structure, multiple sites: Secondary | ICD-10-CM | POA: Diagnosis not present

## 2020-01-24 LAB — HM DEXA SCAN

## 2020-01-24 IMAGING — MG MM DIGITAL SCREENING BILAT W/ TOMO W/ CAD
9 of 12 series · 9 of 28 positions shown · non-contrast
Comparison: Previous exam(s).

CLINICAL DATA: Screening.

EXAM:
DIGITAL SCREENING BILATERAL MAMMOGRAM WITH TOMO AND CAD

[R MLO synth-2D]
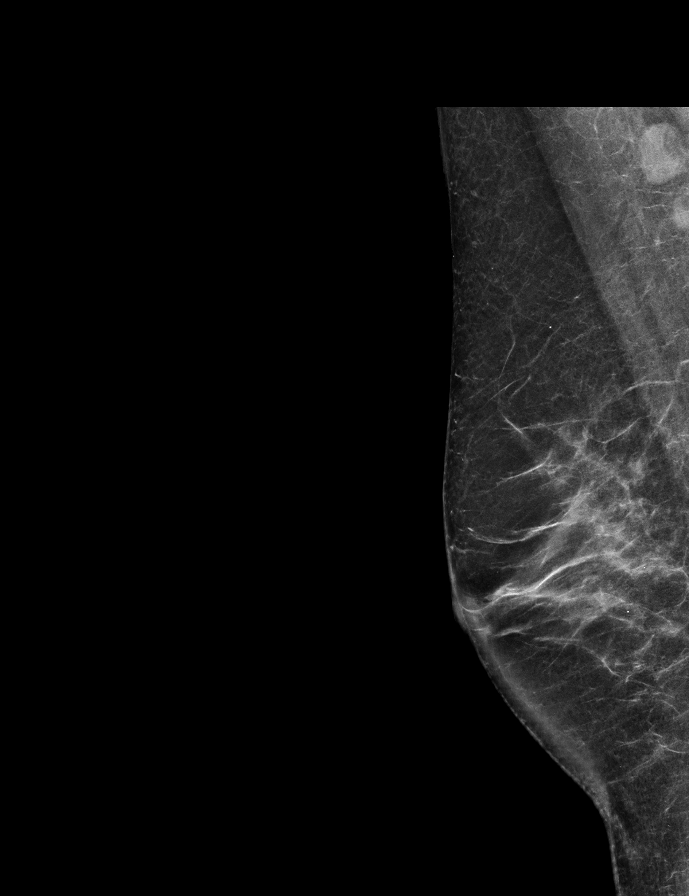

[L MLO synth-2D]
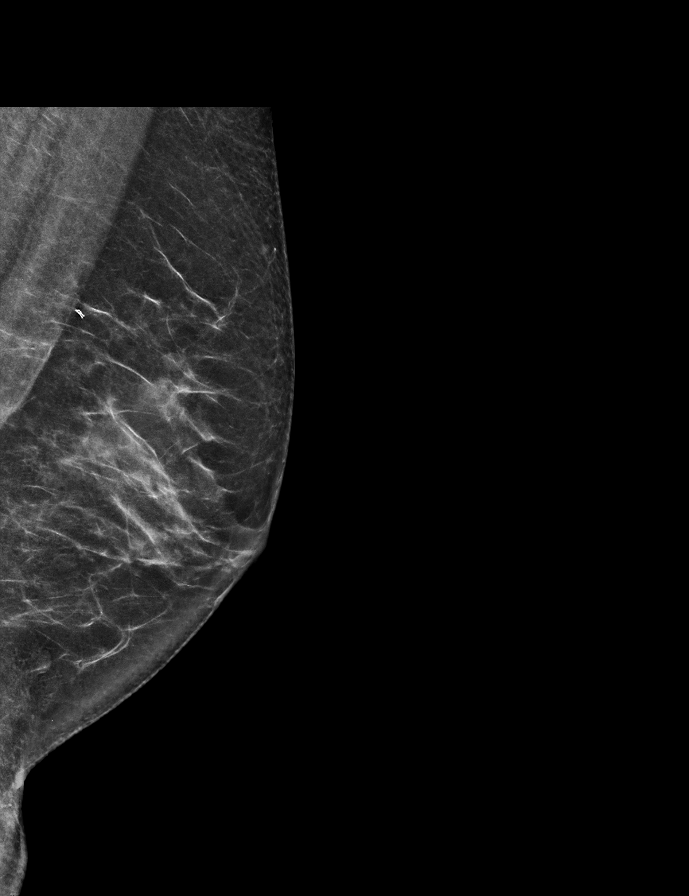

[R CC]
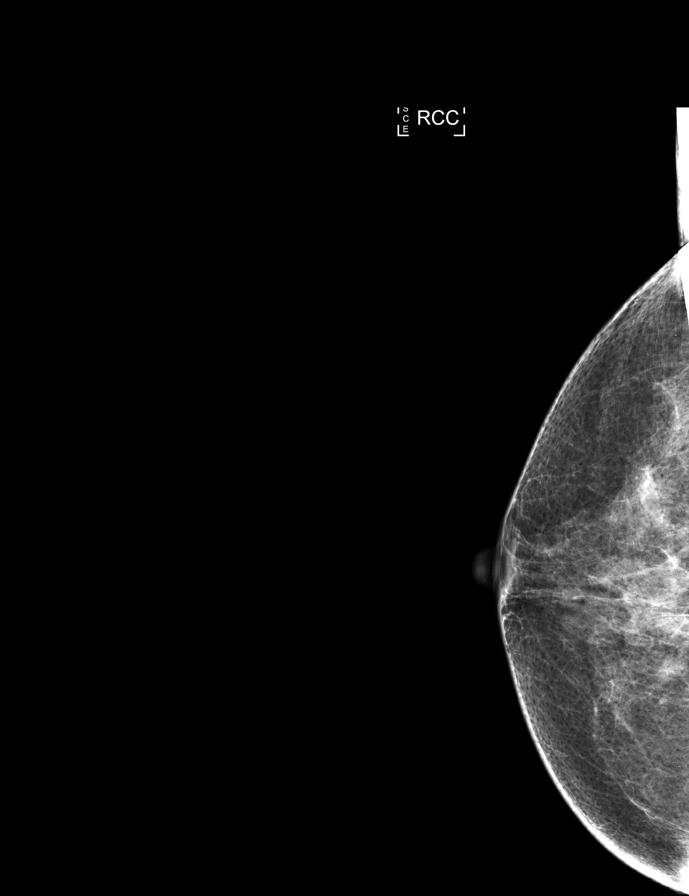

[L CC synth-2D]
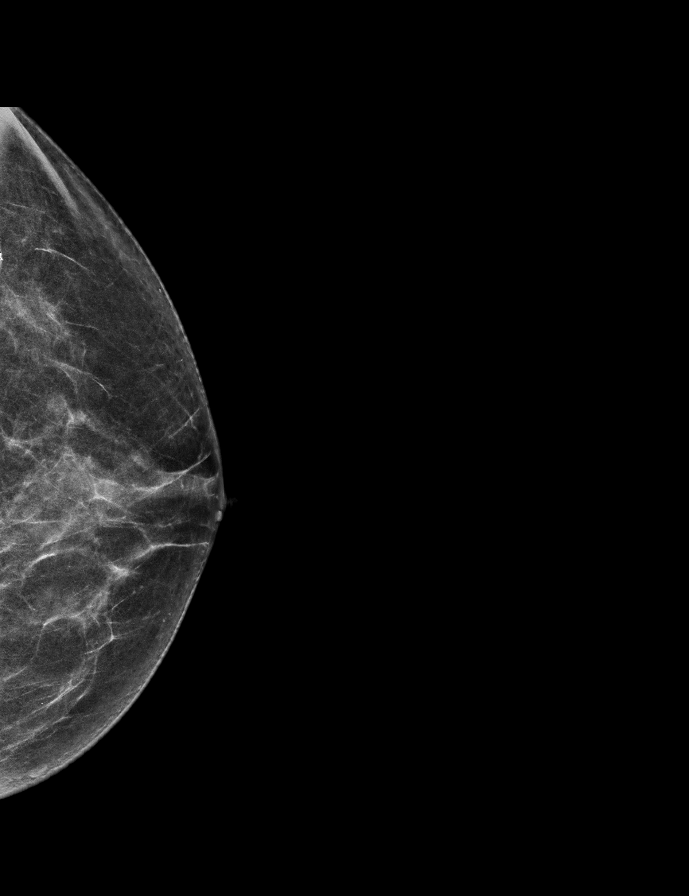

[L MLO]
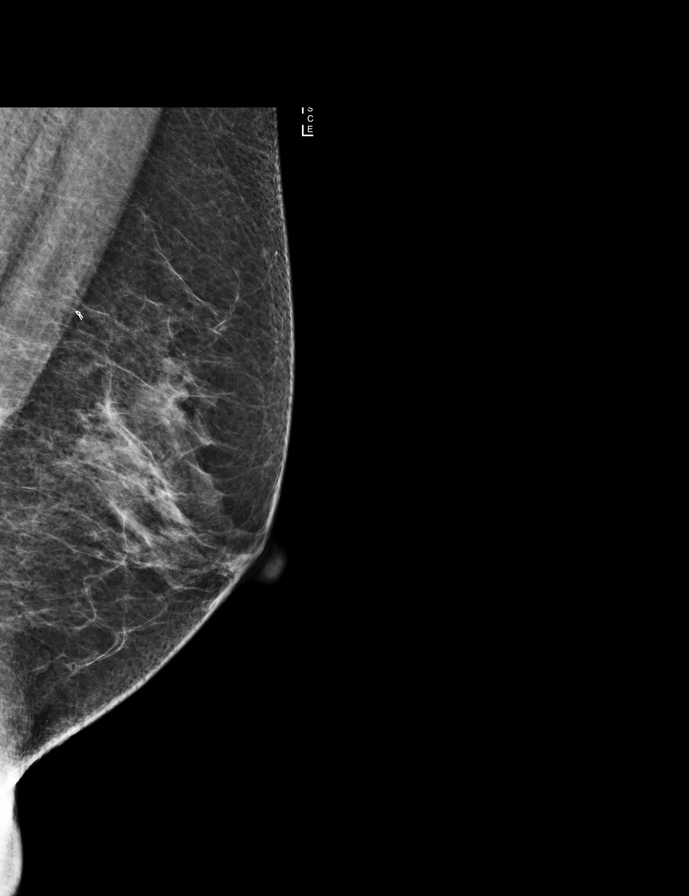

[R MLO]
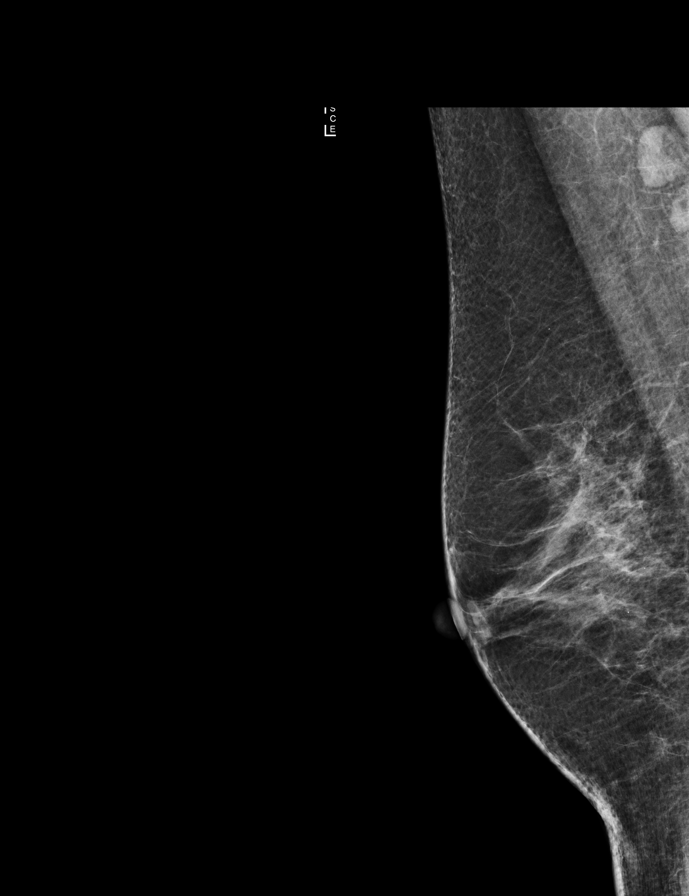

[L CC]
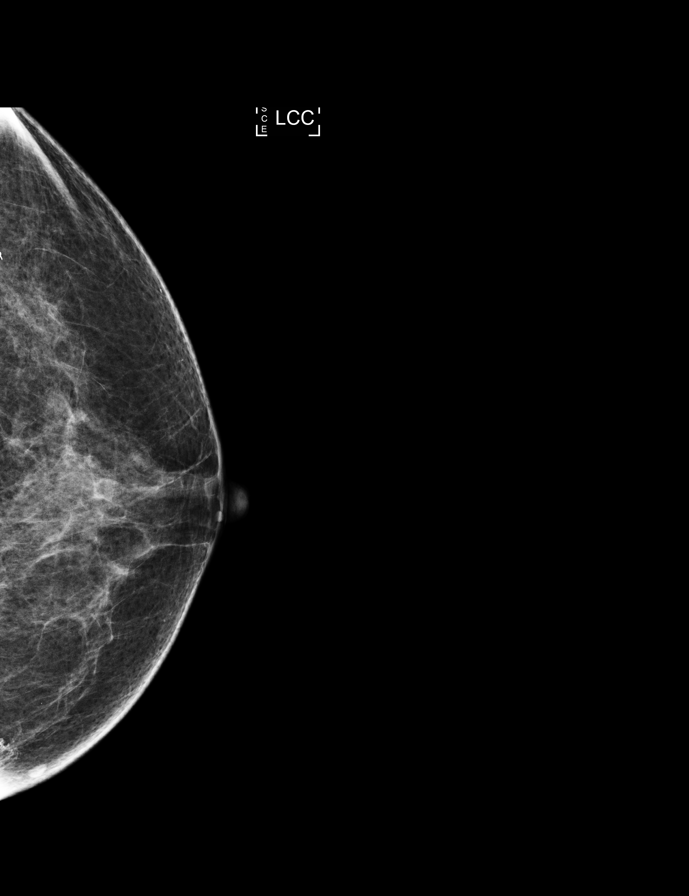

[R CC synth-2D]
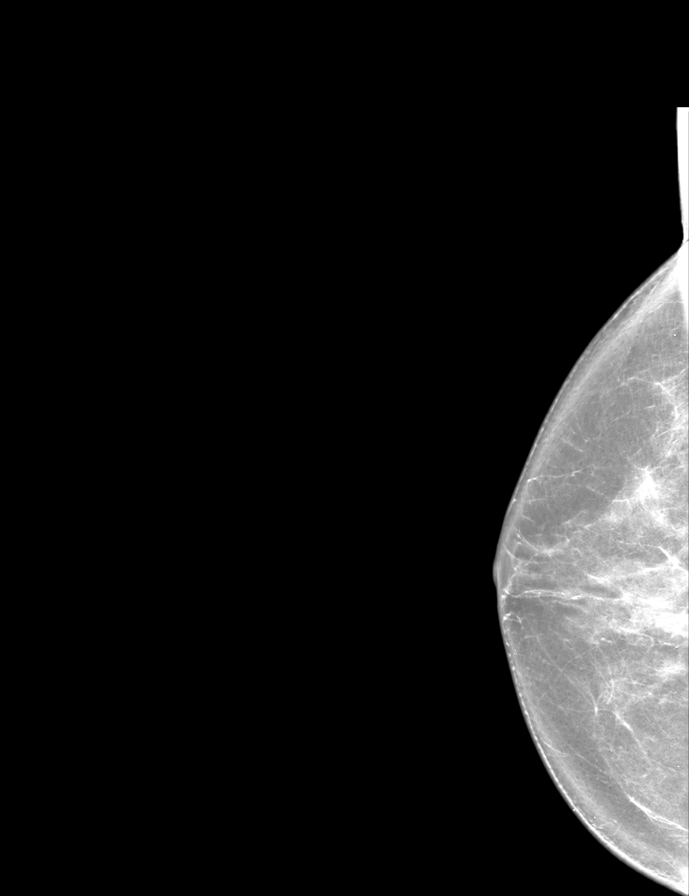

[R MLO tomo · tomo slice 39/76.0]
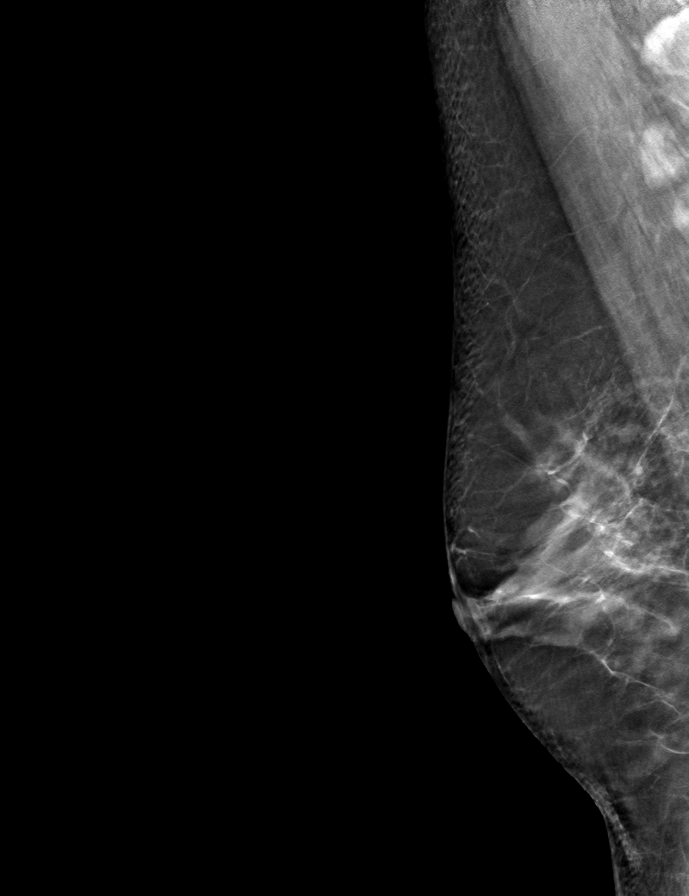

[9 of 28 positions shown; findings below may reference images not displayed]

ACR Breast Density Category c: The breast tissue is heterogeneously
dense, which may obscure small masses.
FINDINGS: There are no findings suspicious for malignancy. Images were
processed with CAD.
IMPRESSION: No mammographic evidence of malignancy. A result letter of this
screening mammogram will be mailed directly to the patient.

RECOMMENDATION:
Screening mammogram in one year. (Code:[id number])

BI-RADS CATEGORY  1: Negative.

## 2020-01-27 ENCOUNTER — Encounter: Payer: Self-pay | Admitting: Physician Assistant

## 2020-01-27 NOTE — Progress Notes (Signed)
Patient advised as below.  

## 2020-01-28 ENCOUNTER — Ambulatory Visit: Payer: Medicare Other | Attending: Internal Medicine

## 2020-01-28 DIAGNOSIS — Z23 Encounter for immunization: Secondary | ICD-10-CM | POA: Insufficient documentation

## 2020-01-28 NOTE — Progress Notes (Signed)
   Covid-19 Vaccination Clinic  Name:  Barbara Gates    MRN: [id number] DOB: 02-07-1951  01/28/2020  Barbara Gates was observed post Covid-19 immunization for 15 minutes without incident. She was provided with Vaccine Information Sheet and instruction to access the V-Safe system.   Barbara Gates was instructed to call 911 with any severe reactions post vaccine: Marland Kitchen Difficulty breathing  . Swelling of face and throat  . A fast heartbeat  . A bad rash all over body  . Dizziness and weakness   Immunizations Administered    Name Date Dose VIS Date Route   Pfizer COVID-19 Vaccine 01/28/2020  9:55 AM 0.3 mL 11/01/2019 Intramuscular   Manufacturer: [place]   Lot: [id number]   [place]: [id number]

## 2020-02-04 ENCOUNTER — Other Ambulatory Visit: Payer: Self-pay

## 2020-02-04 ENCOUNTER — Ambulatory Visit (INDEPENDENT_AMBULATORY_CARE_PROVIDER_SITE_OTHER): Payer: Medicare Other | Admitting: Family Medicine

## 2020-02-04 ENCOUNTER — Encounter: Payer: Self-pay | Admitting: Family Medicine

## 2020-02-04 VITALS — BP 147/82 | HR 56 | Temp 96.6°F | Resp 16 | Ht 67.0 in | Wt 144.0 lb

## 2020-02-04 DIAGNOSIS — M8589 Other specified disorders of bone density and structure, multiple sites: Secondary | ICD-10-CM | POA: Diagnosis not present

## 2020-02-04 DIAGNOSIS — E559 Vitamin D deficiency, unspecified: Secondary | ICD-10-CM | POA: Diagnosis not present

## 2020-02-04 DIAGNOSIS — Z833 Family history of diabetes mellitus: Secondary | ICD-10-CM | POA: Diagnosis not present

## 2020-02-04 DIAGNOSIS — R0609 Other forms of dyspnea: Secondary | ICD-10-CM

## 2020-02-04 DIAGNOSIS — R0600 Dyspnea, unspecified: Secondary | ICD-10-CM

## 2020-02-04 DIAGNOSIS — E538 Deficiency of other specified B group vitamins: Secondary | ICD-10-CM | POA: Diagnosis not present

## 2020-02-04 DIAGNOSIS — R053 Chronic cough: Secondary | ICD-10-CM

## 2020-02-04 DIAGNOSIS — R5382 Chronic fatigue, unspecified: Secondary | ICD-10-CM

## 2020-02-04 DIAGNOSIS — R05 Cough: Secondary | ICD-10-CM | POA: Diagnosis not present

## 2020-02-04 DIAGNOSIS — R739 Hyperglycemia, unspecified: Secondary | ICD-10-CM | POA: Diagnosis not present

## 2020-02-04 NOTE — Progress Notes (Signed)
Patient: Barbara Gates Female    DOB: 1951-03-30   69 y.o.   MRN: [id number] Visit Date: 02/04/2020  Today's Provider: Lavon Paganini, MD   No chief complaint on file.  Subjective:     HPI Patient here today to go over bode density result. Patient reports that she is concerned about the last report. Patient reports that on her graph it is going down. Patient wants to know what she needs to do next.  Patient C/O worsening shortness of breath with exertion, walking up stairs and when she talks to much. Patient reports symptoms have been going on for a very long time >1 yr. Patient reports episode lasts about 5 minutes.    Patient C/O low body temperature. Patient reports that she has been checking since COVID 19 started. Patient reports she uses a digital oral thermometer.. Patient reports reading are as low 94.8, 95.6, 93.6, and 92.9.  She tried to buy a new thermometer and it is also reading low.  Reports that she is low   Allergies  Allergen Reactions  . Other Hives    selamectin active ingredient in Revolution Plus for cats.  . Raloxifene Hcl     cough  . Tamoxifen Cough  . Mango Flavor Itching and Rash     Current Outpatient Medications:  .  Ascorbic Acid (VITAMIN C) 500 MG CAPS, , Disp: , Rfl:  .  BIOTIN 5000 PO, Take 5,000 mcg by mouth daily. , Disp: , Rfl:  .  Cholecalciferol (VITAMIN D3) 50 MCG (2000 UT) capsule, Take 2,000 Units by mouth daily., Disp: , Rfl:  .  Magnesium 400 MG CAPS, Take 1 capsule by mouth daily., Disp: , Rfl:  .  multivitamin-lutein (OCUVITE-LUTEIN) CAPS capsule, Take 1 capsule by mouth daily., Disp: , Rfl:  .  NON FORMULARY, 500 mg daily. Bone Up calcium 1000 mg, Disp: , Rfl:  .  Pediatric Multivitamins-Iron (FLINTSTONES COMPLETE) 18 MG CHEW, , Disp: , Rfl:  .  PHOSPHATIDYLSERINE PO, Take 1 tablet by mouth 2 (two) times daily., Disp: , Rfl:  .  Polyethylene Glycol 3350 (MIRALAX PO), Take 17 g by mouth daily. , Disp: , Rfl:  .   polyvinyl alcohol (LIQUIFILM TEARS) 1.4 % ophthalmic solution, Place 1 drop into both eyes 2 (two) times daily., Disp: , Rfl:  .  senna-docusate (SENOKOT-S) 8.6-50 MG tablet, Take 2 tablets by mouth every evening. , Disp: , Rfl:  .  Turmeric 450 MG CAPS, Take 550 mg by mouth every evening. , Disp: , Rfl:  .  Carboxymethylcellulose Sodium (THERATEARS OP), Apply 1 capsule to eye daily., Disp: , Rfl:  .  ELDERBERRY PO, Take by mouth daily. With Zinc, Disp: , Rfl:  .  famotidine (PEPCID) 20 MG tablet, Take 20 mg by mouth as needed. , Disp: , Rfl:  .  L-Lysine HCl 500 MG TABS, Take 500 mg by mouth daily. , Disp: , Rfl:  .  Magnesium 250 MG TABS, Take 2 tablets by mouth daily. , Disp: , Rfl:  .  Phosphatidylserine 100 MG CAPS, Take 300 mg by mouth 2 (two) times daily., Disp: , Rfl:  .  vitamin B-12 (CYANOCOBALAMIN) 500 MCG tablet, Take 500 mcg by mouth daily. Quick dissolve (cherry), Disp: , Rfl:   Review of Systems  Constitutional: Negative.   Respiratory: Positive for cough (chronic) and shortness of breath (chronic). Negative for wheezing.   Cardiovascular: Negative for palpitations and leg swelling.    Social History  Tobacco Use  . Smoking status: Never Smoker  . Smokeless tobacco: Never Used  Substance Use Topics  . Alcohol use: Yes    Alcohol/week: 1.0 standard drinks    Types: 1 Glasses of wine per week    Comment: 1/2 margarita once a month      Objective:   BP (!) 147/82 (BP Location: Left Arm, Patient Position: Sitting, Cuff Size: Large)   Pulse (!) 56   Temp (!) 96.6 F (35.9 C) (Temporal)   Resp 16   Ht 5\' 7"  (1.702 m)   Wt 144 lb (65.3 kg)   SpO2 99%   BMI 22.55 kg/m  Vitals:   02/04/20 1132 02/04/20 1134  BP: (!) 161/81 (!) 147/82  Pulse: (!) 56   Resp: 16   Temp: (!) 96.6 F (35.9 C)   TempSrc: Temporal   SpO2:  99%  Weight: 144 lb (65.3 kg)   Height: 5\' 7"  (1.702 m)   Body mass index is 22.55 kg/m.   Physical Exam Vitals reviewed.  Constitutional:       General: She is not in acute distress.    Appearance: Normal appearance. She is well-developed. She is not diaphoretic.  HENT:     Head: Normocephalic and atraumatic.  Eyes:     General: No scleral icterus.    Conjunctiva/sclera: Conjunctivae normal.  Neck:     Thyroid: No thyromegaly.  Cardiovascular:     Rate and Rhythm: Normal rate and regular rhythm.     Pulses: Normal pulses.     Heart sounds: Normal heart sounds. No murmur.  Pulmonary:     Effort: Pulmonary effort is normal. No respiratory distress.     Breath sounds: Normal breath sounds. No wheezing, rhonchi or rales.  Musculoskeletal:     Cervical back: Neck supple.     Right lower leg: No edema.     Left lower leg: No edema.  Lymphadenopathy:     Cervical: No cervical adenopathy.  Skin:    General: Skin is warm and dry.     Capillary Refill: Capillary refill takes less than 2 seconds.     Findings: No rash.  Neurological:     Mental Status: She is alert and oriented to person, place, and time. Mental status is at baseline.  Psychiatric:        Mood and Affect: Mood normal.        Behavior: Behavior normal.      No results found for any visits on 02/04/20.     Assessment & Plan    1. Chronic cough 2. DOE (dyspnea on exertion) - longstanding issue - followed by Pulmonology (DR Patsey Berthold) - reviewed recent Echo with normal LVEF, grade 1 diastolic dysfunction, and normal valvular function - will check for any underlying issues, such as thyroid dysfunction or anemia that may be contributing - TSH - CBC w/Diff/Platelet - Comprehensive metabolic panel - VITAMIN D 25 Hydroxy (Vit-D Deficiency, Fractures) - B12 - Magnesium  3. Osteopenia of multiple sites - previously on tamoxifen and raloxifene, but did not tolerate due to cough -Bone density has decreased from last test 2 years ago -Advised her that given her strong family history of osteoporosis and her previous treatment with osteopenia, she would be  a good candidate to see endocrinology to discuss other options, like Prolia -She is concerned about the side effects of bisphosphonates and has never taken 1 of those -Check calcium and vitamin D today -Advised regular weightbearing exercise - Comprehensive metabolic panel -  VITAMIN D 25 Hydroxy (Vit-D Deficiency, Fractures) - Ambulatory referral to Endocrinology  4. Family history of diabetes mellitus - Hemoglobin A1c  5. Chronic fatigue -Longstanding issue -Check labs for any underlying vitamin deficiencies, thyroid dysfunction, anemia, etc. that could be contributing - TSH - CBC w/Diff/Platelet - Comprehensive metabolic panel - VITAMIN D 25 Hydroxy (Vit-D Deficiency, Fractures) - B12 - Magnesium  6. B12 deficiency - B12  7. Avitaminosis D - VITAMIN D 25 Hydroxy (Vit-D Deficiency, Fractures)   Return if symptoms worsen or fail to improve.   The entirety of the information documented in the History of Present Illness, Review of Systems and Physical Exam were personally obtained by me. Portions of this information were initially documented by Lynford Humphrey, CMA and reviewed by me for thoroughness and accuracy.    Judene Logue, Dionne Bucy, MD MPH [facility]

## 2020-02-05 ENCOUNTER — Telehealth: Payer: Self-pay

## 2020-02-05 LAB — COMPREHENSIVE METABOLIC PANEL
ALT: 12 IU/L (ref 0–32)
AST: 21 IU/L (ref 0–40)
Albumin/Globulin Ratio: 1.8 (ref 1.2–2.2)
Albumin: 4.6 g/dL (ref 3.8–4.8)
Alkaline Phosphatase: 83 IU/L (ref 39–117)
BUN/Creatinine Ratio: 19 (ref 12–28)
BUN: 15 mg/dL (ref 8–27)
Bilirubin Total: 0.3 mg/dL (ref 0.0–1.2)
CO2: 23 mmol/L (ref 20–29)
Calcium: 9.8 mg/dL (ref 8.7–10.3)
Chloride: 99 mmol/L (ref 96–106)
Creatinine, Ser: 0.81 mg/dL (ref 0.57–1.00)
GFR calc Af Amer: 86 mL/min/{1.73_m2} (ref >59)
GFR calc non Af Amer: 75 mL/min/{1.73_m2} (ref >59)
Globulin, Total: 2.6 g/dL (ref 1.5–4.5)
Glucose: 89 mg/dL (ref 65–99)
Potassium: 4.1 mmol/L (ref 3.5–5.2)
Sodium: 138 mmol/L (ref 134–144)
Total Protein: 7.2 g/dL (ref 6.0–8.5)

## 2020-02-05 LAB — CBC WITH DIFFERENTIAL/PLATELET
Basophils Absolute: 0.1 10*3/uL (ref 0.0–0.2)
Basos: 1 %
EOS (ABSOLUTE): 0.2 10*3/uL (ref 0.0–0.4)
Eos: 3 %
Hematocrit: 41.4 % (ref 34.0–46.6)
Hemoglobin: 13.8 g/dL (ref 11.1–15.9)
Immature Grans (Abs): 0 10*3/uL (ref 0.0–0.1)
Immature Granulocytes: 0 %
Lymphocytes Absolute: 2.2 10*3/uL (ref 0.7–3.1)
Lymphs: 38 %
MCH: 30.7 pg (ref 26.6–33.0)
MCHC: 33.3 g/dL (ref 31.5–35.7)
MCV: 92 fL (ref 79–97)
Monocytes Absolute: 0.5 10*3/uL (ref 0.1–0.9)
Monocytes: 8 %
Neutrophils Absolute: 3 10*3/uL (ref 1.4–7.0)
Neutrophils: 50 %
Platelets: 243 10*3/uL (ref 150–450)
RBC: 4.49 x10E6/uL (ref 3.77–5.28)
RDW: 11.9 % (ref 11.7–15.4)
WBC: 5.9 10*3/uL (ref 3.4–10.8)

## 2020-02-05 LAB — HEMOGLOBIN A1C
Est. average glucose Bld gHb Est-mCnc: 111 mg/dL
Hgb A1c MFr Bld: 5.5 % (ref 4.8–5.6)

## 2020-02-05 LAB — VITAMIN D 25 HYDROXY (VIT D DEFICIENCY, FRACTURES): Vit D, 25-Hydroxy: 68.8 ng/mL (ref 30.0–100.0)

## 2020-02-05 LAB — MAGNESIUM: Magnesium: 2.3 mg/dL (ref 1.6–2.3)

## 2020-02-05 LAB — VITAMIN B12: Vitamin B-12: 999 pg/mL (ref 232–1245)

## 2020-02-05 LAB — TSH: TSH: 4.3 u[IU]/mL (ref 0.450–4.500)

## 2020-02-05 NOTE — Telephone Encounter (Signed)
Pt called to see if it could be Murelax that is causing her issues/ please advise / leave message if no answer

## 2020-02-05 NOTE — Telephone Encounter (Signed)
Left detailed message for patient on voicemail.

## 2020-02-05 NOTE — Telephone Encounter (Signed)
Pt advised.  She wanted to know what the next step is to find out why she is having symptoms such as Shortness of breath.     Thanks,   -Mickel Baas

## 2020-02-05 NOTE — Telephone Encounter (Signed)
-----   Message from Virginia Crews, MD sent at 02/05/2020  7:59 AM EDT ----- Normal labs

## 2020-02-05 NOTE — Telephone Encounter (Signed)
Miralax should not be causing the symptoms

## 2020-02-05 NOTE — Telephone Encounter (Signed)
Next step is to follow-up with Pulmonology as they are working this up.  Ruled out many underlying causes

## 2020-03-17 ENCOUNTER — Ambulatory Visit
Admission: RE | Admit: 2020-03-17 | Discharge: 2020-03-17 | Disposition: A | Discharge status: Home / Self Care | Payer: Medicare Other | Source: Ambulatory Visit | Attending: Family Medicine | Admitting: Family Medicine

## 2020-03-17 DIAGNOSIS — Z1231 Encounter for screening mammogram for malignant neoplasm of breast: Secondary | ICD-10-CM | POA: Diagnosis not present

## 2020-03-18 ENCOUNTER — Telehealth: Payer: Self-pay

## 2020-03-18 NOTE — Telephone Encounter (Signed)
-----   Message from Virginia Crews, MD sent at 03/18/2020 11:14 AM EDT ----- Normal mammogram. Repeat in 1 yr

## 2020-03-18 NOTE — Telephone Encounter (Signed)
Pt returned call, result note read, verbalizes understanding.

## 2020-03-18 NOTE — Telephone Encounter (Signed)
LMTCB, PEC may give result.  

## 2020-03-25 DIAGNOSIS — M810 Age-related osteoporosis without current pathological fracture: Secondary | ICD-10-CM | POA: Diagnosis not present

## 2020-04-03 DIAGNOSIS — H2513 Age-related nuclear cataract, bilateral: Secondary | ICD-10-CM | POA: Diagnosis not present

## 2020-04-03 DIAGNOSIS — H40003 Preglaucoma, unspecified, bilateral: Secondary | ICD-10-CM | POA: Diagnosis not present

## 2020-05-20 ENCOUNTER — Encounter: Payer: Self-pay | Admitting: Ophthalmology

## 2020-05-20 ENCOUNTER — Other Ambulatory Visit: Payer: Self-pay

## 2020-05-21 NOTE — Discharge Instructions (Signed)

## 2020-05-26 NOTE — Anesthesia Preprocedure Evaluation (Addendum)
Anesthesia Evaluation  Patient identified by MRN, date of birth, ID band Patient awake    Reviewed: Allergy & Precautions, NPO status , Patient's Chart, lab work & pertinent test results, reviewed documented beta blocker date and time   History of Anesthesia Complications Negative for: history of anesthetic complications  Airway Mallampati: II  TM Distance: >3 FB Neck ROM: Full    Dental  (+)    Pulmonary    breath sounds clear to auscultation       Cardiovascular (-) angina(-) DOE  Rhythm:Regular Rate:Normal   HLD   Neuro/Psych    GI/Hepatic neg GERD  ,  Endo/Other   Endometriosis  Renal/GU      Musculoskeletal   Abdominal   Peds  Hematology   Anesthesia Other Findings Vulvar cancer  Reproductive/Obstetrics                            Anesthesia Physical Anesthesia Plan  ASA: II  Anesthesia Plan: MAC   Post-op Pain Management:    Induction: Intravenous  PONV Risk Score and Plan: 2 and TIVA, Midazolam and Treatment may vary due to age or medical condition  Airway Management Planned: Nasal Cannula  Additional Equipment:   Intra-op Plan:   Post-operative Plan:   Informed Consent: I have reviewed the patients History and Physical, chart, labs and discussed the procedure including the risks, benefits and alternatives for the proposed anesthesia with the patient or authorized representative who has indicated his/her understanding and acceptance.       Plan Discussed with: CRNA and Anesthesiologist  Anesthesia Plan Comments:         Anesthesia Quick Evaluation

## 2020-05-27 ENCOUNTER — Other Ambulatory Visit: Payer: Self-pay

## 2020-05-27 ENCOUNTER — Ambulatory Visit: Payer: Medicare Other | Admitting: Anesthesiology

## 2020-05-27 ENCOUNTER — Ambulatory Visit
Admission: RE | Admit: 2020-05-27 | Discharge: 2020-05-27 | Disposition: A | Discharge status: Home / Self Care | Payer: Medicare Other | Attending: Ophthalmology | Admitting: Ophthalmology

## 2020-05-27 ENCOUNTER — Encounter: Payer: Self-pay | Admitting: Ophthalmology

## 2020-05-27 ENCOUNTER — Encounter
Admission: RE | Disposition: A | Discharge status: Home / Self Care | Payer: Self-pay | Source: Home / Self Care | Attending: Ophthalmology

## 2020-05-27 DIAGNOSIS — M810 Age-related osteoporosis without current pathological fracture: Secondary | ICD-10-CM | POA: Insufficient documentation

## 2020-05-27 DIAGNOSIS — Z90710 Acquired absence of both cervix and uterus: Secondary | ICD-10-CM | POA: Diagnosis not present

## 2020-05-27 DIAGNOSIS — H2511 Age-related nuclear cataract, right eye: Secondary | ICD-10-CM | POA: Insufficient documentation

## 2020-05-27 DIAGNOSIS — Z853 Personal history of malignant neoplasm of breast: Secondary | ICD-10-CM | POA: Diagnosis not present

## 2020-05-27 DIAGNOSIS — R0602 Shortness of breath: Secondary | ICD-10-CM | POA: Diagnosis not present

## 2020-05-27 DIAGNOSIS — Z888 Allergy status to other drugs, medicaments and biological substances status: Secondary | ICD-10-CM | POA: Insufficient documentation

## 2020-05-27 DIAGNOSIS — N809 Endometriosis, unspecified: Secondary | ICD-10-CM | POA: Diagnosis not present

## 2020-05-27 DIAGNOSIS — H25811 Combined forms of age-related cataract, right eye: Secondary | ICD-10-CM | POA: Diagnosis not present

## 2020-05-27 HISTORY — DX: Motion sickness, initial encounter: T75.3XXA

## 2020-05-27 HISTORY — PX: CATARACT EXTRACTION W/PHACO: SHX586

## 2020-05-27 SURGERY — PHACOEMULSIFICATION, CATARACT, WITH IOL INSERTION
Anesthesia: Monitor Anesthesia Care | Site: Eye | Laterality: Right

## 2020-05-27 MED ORDER — MOXIFLOXACIN HCL 0.5 % OP SOLN
1.0000 [drp] | OPHTHALMIC | Status: DC | PRN
  Administered 2020-05-27 (×3): 1 [drp] via OPHTHALMIC

## 2020-05-27 MED ORDER — MIDAZOLAM HCL 2 MG/2ML IJ SOLN
INTRAMUSCULAR | Status: DC | PRN
  Administered 2020-05-27: 1 mg via INTRAVENOUS

## 2020-05-27 MED ORDER — ACETAMINOPHEN 10 MG/ML IV SOLN: 1000.0000 mg | Freq: Once | INTRAVENOUS | Status: DC | PRN

## 2020-05-27 MED ORDER — FENTANYL CITRATE (PF) 100 MCG/2ML IJ SOLN
INTRAMUSCULAR | Status: DC | PRN
  Administered 2020-05-27: 50 ug via INTRAVENOUS

## 2020-05-27 MED ORDER — LACTATED RINGERS IV SOLN: INTRAVENOUS | Status: DC

## 2020-05-27 MED ORDER — ONDANSETRON HCL 4 MG/2ML IJ SOLN: 4.0000 mg | Freq: Once | INTRAMUSCULAR | Status: DC | PRN

## 2020-05-27 MED ORDER — ARMC OPHTHALMIC DILATING DROPS
1.0000 "application " | OPHTHALMIC | Status: DC | PRN
  Administered 2020-05-27 (×3): 1 via OPHTHALMIC

## 2020-05-27 MED ORDER — EPINEPHRINE PF 1 MG/ML IJ SOLN
INTRAOCULAR | Status: DC | PRN
  Administered 2020-05-27: 63 mL via OPHTHALMIC

## 2020-05-27 MED ORDER — TETRACAINE HCL 0.5 % OP SOLN
1.0000 [drp] | OPHTHALMIC | Status: DC | PRN
  Administered 2020-05-27 (×2): 1 [drp] via OPHTHALMIC

## 2020-05-27 MED ORDER — CEFUROXIME OPHTHALMIC INJECTION 1 MG/0.1 ML
INJECTION | OPHTHALMIC | Status: DC | PRN
  Administered 2020-05-27: 0.1 mL via INTRACAMERAL

## 2020-05-27 MED ORDER — NA HYALUR & NA CHOND-NA HYALUR 0.4-0.35 ML IO KIT
PACK | INTRAOCULAR | Status: DC | PRN
  Administered 2020-05-27: 1 mL via INTRAOCULAR

## 2020-05-27 MED ORDER — LIDOCAINE HCL (PF) 2 % IJ SOLN: INTRAOCULAR | Status: DC | PRN

## 2020-05-27 MED ORDER — BRIMONIDINE TARTRATE-TIMOLOL 0.2-0.5 % OP SOLN
OPHTHALMIC | Status: DC | PRN
  Administered 2020-05-27: 1 [drp] via OPHTHALMIC

## 2020-05-27 SURGICAL SUPPLY — 29 items
CANNULA ANT/CHMB 27G (MISCELLANEOUS) ×1 IMPLANT
CANNULA ANT/CHMB 27GA (MISCELLANEOUS) ×3 IMPLANT
GLOVE SURG LX 7.5 STRW (GLOVE) ×4
GLOVE SURG LX STRL 7.5 STRW (GLOVE) ×1 IMPLANT
GLOVE SURG TRIUMPH 8.0 PF LTX (GLOVE) ×3 IMPLANT
GOWN STRL REUS W/ TWL LRG LVL3 (GOWN DISPOSABLE) ×2 IMPLANT
GOWN STRL REUS W/TWL LRG LVL3 (GOWN DISPOSABLE) ×6
LENS IOL ACRSF IQ PAN 16.5 IMPLANT
LENS IOL IQ PANOPTIX 16.5 ×3 IMPLANT
MARKER SKIN DUAL TIP RULER LAB (MISCELLANEOUS) ×3 IMPLANT
NDL CAPSULORHEX 25GA (NEEDLE) ×1 IMPLANT
NDL FILTER BLUNT 18X1 1/2 (NEEDLE) ×2 IMPLANT
NDL RETROBULBAR .5 NSTRL (NEEDLE) IMPLANT
NEEDLE CAPSULORHEX 25GA (NEEDLE) ×3 IMPLANT
NEEDLE FILTER BLUNT 18X 1/2SAF (NEEDLE) ×4
NEEDLE FILTER BLUNT 18X1 1/2 (NEEDLE) ×2 IMPLANT
PACK CATARACT BRASINGTON (MISCELLANEOUS) ×3 IMPLANT
PACK EYE AFTER SURG (MISCELLANEOUS) ×3 IMPLANT
PACK OPTHALMIC (MISCELLANEOUS) ×3 IMPLANT
RING MALYGIN 7.0 (MISCELLANEOUS) IMPLANT
SOLUTION OPHTHALMIC SALT (MISCELLANEOUS) ×3 IMPLANT
SUT ETHILON 10-0 CS-B-6CS-B-6 (SUTURE)
SUT VICRYL  9 0 (SUTURE)
SUT VICRYL 9 0 (SUTURE) IMPLANT
SUTURE EHLN 10-0 CS-B-6CS-B-6 (SUTURE) IMPLANT
SYR 3ML LL SCALE MARK (SYRINGE) ×6 IMPLANT
SYR TB 1ML LUER SLIP (SYRINGE) ×3 IMPLANT
WATER STERILE IRR 250ML POUR (IV SOLUTION) ×3 IMPLANT
WIPE NON LINTING 3.25X3.25 (MISCELLANEOUS) ×3 IMPLANT

## 2020-05-27 NOTE — Op Note (Signed)
LOCATION:  [place]   PREOPERATIVE DIAGNOSIS:    Nuclear sclerotic cataract right eye. H25.11   POSTOPERATIVE DIAGNOSIS:  Nuclear sclerotic cataract right eye.     PROCEDURE:  Phacoemusification with posterior chamber intraocular lens placement of the right eye   ULTRASOUND TIME: Procedure(s) with comments: CATARACT EXTRACTION PHACO AND INTRAOCULAR LENS PLACEMENT (IOC) RIGHT PANOPTIC LENS (Right) - 2.14 0:43.8 4.8%  LENS:   Implant Name Type Inv. Item Serial No. Manufacturer Lot No. LRB No. Used Action  ACRYSOF IQ PANOPTIX IOL Intraocular Lens  [id number] ALCON  Right 1 Implanted         SURGEON:  Wyonia Hough, MD   ANESTHESIA:  Topical with tetracaine drops and 2% Xylocaine jelly, augmented with 1% preservative-free intracameral lidocaine.    COMPLICATIONS:  None.   DESCRIPTION OF PROCEDURE:  The patient was identified in the holding room and transported to the operating room and placed in the supine position under the operating microscope.  The right eye was identified as the operative eye and it was prepped and draped in the usual sterile ophthalmic fashion.   A 1 millimeter clear-corneal paracentesis was made at the 12:00 position.  0.5 ml of preservative-free 1% lidocaine was injected into the anterior chamber. The anterior chamber was filled with Viscoat viscoelastic.  A 2.4 millimeter keratome was used to make a near-clear corneal incision at the 9:00 position.  A curvilinear capsulorrhexis was made with a cystotome and capsulorrhexis forceps.  Balanced salt solution was used to hydrodissect and hydrodelineate the nucleus.   Phacoemulsification was then used in stop and chop fashion to remove the lens nucleus and epinucleus.  The remaining cortex was then removed using the irrigation and aspiration handpiece. Provisc was then placed into the capsular bag to distend it for lens placement.  A lens was then injected into the capsular bag.  The remaining  viscoelastic was aspirated.   Wounds were hydrated with balanced salt solution.  The anterior chamber was inflated to a physiologic pressure with balanced salt solution.  No wound leaks were noted. Cefuroxime 0.1 ml of a 10mg /ml solution was injected into the anterior chamber for a dose of 1 mg of intracameral antibiotic at the completion of the case.   Timolol and Brimonidine drops were applied to the eye.  The patient was taken to the recovery room in stable condition without complications of anesthesia or surgery.   Wilsie Kern 05/27/2020, 9:02 AM

## 2020-05-27 NOTE — Transfer of Care (Signed)
Immediate Anesthesia Transfer of Care Note  Patient: Barbara Gates  Procedure(s) Performed: CATARACT EXTRACTION PHACO AND INTRAOCULAR LENS PLACEMENT (IOC) RIGHT PANOPTIC LENS (Right Eye)  Patient Location: PACU  Anesthesia Type: MAC  Level of Consciousness: awake, alert  and patient cooperative  Airway and Oxygen Therapy: Patient Spontanous Breathing   Post-op Assessment: Post-op Vital signs reviewed, Patient's Cardiovascular Status Stable, Respiratory Function Stable, Patent Airway and No signs of Nausea or vomiting  Post-op Vital Signs: Reviewed and stable  Complications: No complications documented.

## 2020-05-27 NOTE — H&P (Signed)

## 2020-05-27 NOTE — Anesthesia Postprocedure Evaluation (Signed)
Anesthesia Post Note  Patient: Barbara Gates  Procedure(s) Performed: CATARACT EXTRACTION PHACO AND INTRAOCULAR LENS PLACEMENT (IOC) RIGHT PANOPTIC LENS (Right Eye)     Patient location during evaluation: PACU Anesthesia Type: MAC Level of consciousness: awake and alert Pain management: pain level controlled Vital Signs Assessment: post-procedure vital signs reviewed and stable Respiratory status: spontaneous breathing, nonlabored ventilation, respiratory function stable and patient connected to nasal cannula oxygen Cardiovascular status: stable and blood pressure returned to baseline Postop Assessment: no apparent nausea or vomiting Anesthetic complications: no   No complications documented.  Barbara Haran A  Jiali Gates

## 2020-05-27 NOTE — Anesthesia Procedure Notes (Signed)
Procedure Name: MAC Performed by: Vanetta Shawl, CRNA Pre-anesthesia Checklist: Patient identified, Emergency Drugs available, Suction available, Timeout performed and Patient being monitored Patient Re-evaluated:Patient Re-evaluated prior to induction Oxygen Delivery Method: Nasal cannula Placement Confirmation: positive ETCO2

## 2020-05-28 ENCOUNTER — Encounter: Payer: Self-pay | Admitting: Ophthalmology

## 2020-06-17 DIAGNOSIS — H2512 Age-related nuclear cataract, left eye: Secondary | ICD-10-CM | POA: Diagnosis not present

## 2020-06-24 ENCOUNTER — Encounter: Payer: Self-pay | Admitting: Ophthalmology

## 2020-06-25 ENCOUNTER — Encounter: Payer: Self-pay | Admitting: Ophthalmology

## 2020-06-25 ENCOUNTER — Other Ambulatory Visit: Payer: Self-pay

## 2020-06-25 ENCOUNTER — Telehealth: Payer: Self-pay

## 2020-06-25 NOTE — Telephone Encounter (Signed)
Patient advised.

## 2020-06-25 NOTE — Telephone Encounter (Signed)
Is she questioning the echocardiogram she had in February?  It looks normal.

## 2020-06-25 NOTE — Telephone Encounter (Signed)
Copied from [place] [id number]. Topic: General - Inquiry >> Jun 25, 2020 10:38 AM Percell Belt A wrote: Reason for CRM: pt called in and stated that she seen the eco and wanted to know if there was any concerns with it?  She is going to have another eye surgery and Dr told her to check with PCP about her eco results  Best number [phone]

## 2020-06-29 ENCOUNTER — Other Ambulatory Visit: Admission: RE | Admit: 2020-06-29 | Payer: Medicare Other | Source: Ambulatory Visit

## 2020-07-14 ENCOUNTER — Encounter: Payer: Self-pay | Admitting: Ophthalmology

## 2020-07-20 ENCOUNTER — Other Ambulatory Visit: Payer: Self-pay

## 2020-07-20 ENCOUNTER — Other Ambulatory Visit
Admission: RE | Admit: 2020-07-20 | Discharge: 2020-07-20 | Disposition: A | Discharge status: Home / Self Care | Payer: Medicare Other | Source: Ambulatory Visit | Attending: Ophthalmology | Admitting: Ophthalmology

## 2020-07-20 DIAGNOSIS — Z20822 Contact with and (suspected) exposure to covid-19: Secondary | ICD-10-CM | POA: Diagnosis not present

## 2020-07-20 DIAGNOSIS — Z01812 Encounter for preprocedural laboratory examination: Secondary | ICD-10-CM | POA: Diagnosis not present

## 2020-07-20 LAB — SARS CORONAVIRUS 2 (TAT 6-24 HRS): SARS Coronavirus 2: NEGATIVE

## 2020-07-21 NOTE — Discharge Instructions (Signed)

## 2020-07-22 ENCOUNTER — Encounter: Payer: Self-pay | Admitting: Ophthalmology

## 2020-07-22 ENCOUNTER — Ambulatory Visit
Admission: RE | Admit: 2020-07-22 | Discharge: 2020-07-22 | Disposition: A | Discharge status: Home / Self Care | Payer: Medicare Other | Attending: Ophthalmology | Admitting: Ophthalmology

## 2020-07-22 ENCOUNTER — Ambulatory Visit: Payer: Medicare Other | Admitting: Anesthesiology

## 2020-07-22 ENCOUNTER — Other Ambulatory Visit: Payer: Self-pay

## 2020-07-22 ENCOUNTER — Encounter
Admission: RE | Disposition: A | Discharge status: Home / Self Care | Payer: Self-pay | Source: Home / Self Care | Attending: Ophthalmology

## 2020-07-22 DIAGNOSIS — C50919 Malignant neoplasm of unspecified site of unspecified female breast: Secondary | ICD-10-CM | POA: Diagnosis not present

## 2020-07-22 DIAGNOSIS — Z79899 Other long term (current) drug therapy: Secondary | ICD-10-CM | POA: Diagnosis not present

## 2020-07-22 DIAGNOSIS — Z7983 Long term (current) use of bisphosphonates: Secondary | ICD-10-CM | POA: Insufficient documentation

## 2020-07-22 DIAGNOSIS — K219 Gastro-esophageal reflux disease without esophagitis: Secondary | ICD-10-CM | POA: Diagnosis not present

## 2020-07-22 DIAGNOSIS — H25812 Combined forms of age-related cataract, left eye: Secondary | ICD-10-CM | POA: Diagnosis not present

## 2020-07-22 DIAGNOSIS — Z888 Allergy status to other drugs, medicaments and biological substances status: Secondary | ICD-10-CM | POA: Insufficient documentation

## 2020-07-22 DIAGNOSIS — M810 Age-related osteoporosis without current pathological fracture: Secondary | ICD-10-CM | POA: Diagnosis not present

## 2020-07-22 DIAGNOSIS — E785 Hyperlipidemia, unspecified: Secondary | ICD-10-CM | POA: Insufficient documentation

## 2020-07-22 DIAGNOSIS — H2512 Age-related nuclear cataract, left eye: Secondary | ICD-10-CM | POA: Insufficient documentation

## 2020-07-22 HISTORY — DX: Dyspnea, unspecified: R06.00

## 2020-07-22 HISTORY — PX: CATARACT EXTRACTION W/PHACO: SHX586

## 2020-07-22 HISTORY — DX: Other specified congenital malformations of intestine: Q43.8

## 2020-07-22 SURGERY — PHACOEMULSIFICATION, CATARACT, WITH IOL INSERTION
Anesthesia: Monitor Anesthesia Care | Site: Eye | Laterality: Left

## 2020-07-22 MED ORDER — ACETAMINOPHEN 160 MG/5ML PO SOLN: 325.0000 mg | Freq: Once | ORAL | Status: DC

## 2020-07-22 MED ORDER — CEFUROXIME OPHTHALMIC INJECTION 1 MG/0.1 ML
INJECTION | OPHTHALMIC | Status: DC | PRN
  Administered 2020-07-22: 0.1 mL via INTRACAMERAL

## 2020-07-22 MED ORDER — FENTANYL CITRATE (PF) 100 MCG/2ML IJ SOLN
INTRAMUSCULAR | Status: DC | PRN
Start: 2020-07-22 — End: 2020-07-22
  Administered 2020-07-22: 50 ug via INTRAVENOUS

## 2020-07-22 MED ORDER — MOXIFLOXACIN HCL 0.5 % OP SOLN
1.0000 [drp] | OPHTHALMIC | Status: DC | PRN
  Administered 2020-07-22 (×3): 1 [drp] via OPHTHALMIC

## 2020-07-22 MED ORDER — MIDAZOLAM HCL 2 MG/2ML IJ SOLN
INTRAMUSCULAR | Status: DC | PRN
  Administered 2020-07-22: 2 mg via INTRAVENOUS

## 2020-07-22 MED ORDER — ACETAMINOPHEN 325 MG PO TABS: 325.0000 mg | ORAL_TABLET | Freq: Once | ORAL | Status: DC

## 2020-07-22 MED ORDER — BRIMONIDINE TARTRATE-TIMOLOL 0.2-0.5 % OP SOLN
OPHTHALMIC | Status: DC | PRN
  Administered 2020-07-22: 1 [drp] via OPHTHALMIC

## 2020-07-22 MED ORDER — LACTATED RINGERS IV SOLN: INTRAVENOUS | Status: DC

## 2020-07-22 MED ORDER — ARMC OPHTHALMIC DILATING DROPS
1.0000 "application " | OPHTHALMIC | Status: DC | PRN
  Administered 2020-07-22 (×3): 1 via OPHTHALMIC

## 2020-07-22 MED ORDER — TETRACAINE HCL 0.5 % OP SOLN
1.0000 [drp] | OPHTHALMIC | Status: DC | PRN
  Administered 2020-07-22 (×3): 1 [drp] via OPHTHALMIC

## 2020-07-22 MED ORDER — LIDOCAINE HCL (PF) 2 % IJ SOLN
INTRAOCULAR | Status: DC | PRN
  Administered 2020-07-22: 2 mL

## 2020-07-22 MED ORDER — NA HYALUR & NA CHOND-NA HYALUR 0.4-0.35 ML IO KIT
PACK | INTRAOCULAR | Status: DC | PRN
  Administered 2020-07-22: 1 mL via INTRAOCULAR

## 2020-07-22 MED ORDER — EPINEPHRINE PF 1 MG/ML IJ SOLN
INTRAOCULAR | Status: DC | PRN
  Administered 2020-07-22: 51 mL via OPHTHALMIC

## 2020-07-22 SURGICAL SUPPLY — 23 items
CANNULA ANT/CHMB 27G (MISCELLANEOUS) ×1 IMPLANT
CANNULA ANT/CHMB 27GA (MISCELLANEOUS) ×3 IMPLANT
GLOVE SURG LX 7.5 STRW (GLOVE) ×2
GLOVE SURG LX STRL 7.5 STRW (GLOVE) ×1 IMPLANT
GLOVE SURG TRIUMPH 8.0 PF LTX (GLOVE) ×3 IMPLANT
GOWN STRL REUS W/ TWL LRG LVL3 (GOWN DISPOSABLE) ×2 IMPLANT
GOWN STRL REUS W/TWL LRG LVL3 (GOWN DISPOSABLE) ×6
LENS IOL ACRSF IQ PAN 17.0 IMPLANT
LENS IOL IQ PANOPTIX 17.0 ×3 IMPLANT
MARKER SKIN DUAL TIP RULER LAB (MISCELLANEOUS) ×3 IMPLANT
NDL CAPSULORHEX 25GA (NEEDLE) ×1 IMPLANT
NDL FILTER BLUNT 18X1 1/2 (NEEDLE) ×2 IMPLANT
NEEDLE CAPSULORHEX 25GA (NEEDLE) ×3 IMPLANT
NEEDLE FILTER BLUNT 18X 1/2SAF (NEEDLE) ×4
NEEDLE FILTER BLUNT 18X1 1/2 (NEEDLE) ×2 IMPLANT
PACK CATARACT BRASINGTON (MISCELLANEOUS) ×3 IMPLANT
PACK EYE AFTER SURG (MISCELLANEOUS) ×3 IMPLANT
PACK OPTHALMIC (MISCELLANEOUS) ×3 IMPLANT
SOLUTION OPHTHALMIC SALT (MISCELLANEOUS) ×3 IMPLANT
SYR 3ML LL SCALE MARK (SYRINGE) ×6 IMPLANT
SYR TB 1ML LUER SLIP (SYRINGE) ×3 IMPLANT
WATER STERILE IRR 250ML POUR (IV SOLUTION) ×3 IMPLANT
WIPE NON LINTING 3.25X3.25 (MISCELLANEOUS) ×3 IMPLANT

## 2020-07-22 NOTE — Anesthesia Procedure Notes (Signed)
Procedure Name: MAC Date/Time: 07/22/2020 9:52 AM Performed by: Jeannene Patella, CRNA Pre-anesthesia Checklist: Patient identified, Emergency Drugs available, Suction available, Timeout performed and Patient being monitored Patient Re-evaluated:Patient Re-evaluated prior to induction Oxygen Delivery Method: Nasal cannula Placement Confirmation: positive ETCO2

## 2020-07-22 NOTE — Transfer of Care (Signed)
Immediate Anesthesia Transfer of Care Note  Patient: Barbara Gates  Procedure(s) Performed: CATARACT EXTRACTION PHACO AND INTRAOCULAR LENS PLACEMENT (IOC) LEFT PANOPTIX LENS 5.31 00:49.1 10.8% (Left Eye)  Patient Location: PACU  Anesthesia Type: MAC  Level of Consciousness: awake, alert  and patient cooperative  Airway and Oxygen Therapy: Patient Spontanous Breathing and Patient connected to supplemental oxygen  Post-op Assessment: Post-op Vital signs reviewed, Patient's Cardiovascular Status Stable, Respiratory Function Stable, Patent Airway and No signs of Nausea or vomiting  Post-op Vital Signs: Reviewed and stable  Complications: No complications documented.

## 2020-07-22 NOTE — Anesthesia Postprocedure Evaluation (Signed)
Anesthesia Post Note  Patient: Barbara Gates  Procedure(s) Performed: CATARACT EXTRACTION PHACO AND INTRAOCULAR LENS PLACEMENT (IOC) LEFT PANOPTIX LENS 5.31 00:49.1 10.8% (Left Eye)     Patient location during evaluation: PACU Anesthesia Type: MAC Level of consciousness: awake and alert and oriented Pain management: satisfactory to patient Vital Signs Assessment: post-procedure vital signs reviewed and stable Respiratory status: spontaneous breathing, nonlabored ventilation and respiratory function stable Cardiovascular status: blood pressure returned to baseline and stable Postop Assessment: Adequate PO intake and No signs of nausea or vomiting Anesthetic complications: no   No complications documented.  Raliegh Ip

## 2020-07-22 NOTE — H&P (Signed)

## 2020-07-22 NOTE — Op Note (Signed)
OPERATIVE NOTE  LATIFA NOBLE [id number] 07/22/2020   PREOPERATIVE DIAGNOSIS:  Nuclear sclerotic cataract left eye. H25.12   POSTOPERATIVE DIAGNOSIS:    Nuclear sclerotic cataract left eye.     PROCEDURE:  Phacoemusification with posterior chamber intraocular lens placement of the left eye  Ultrasound time: Procedure(s): CATARACT EXTRACTION PHACO AND INTRAOCULAR LENS PLACEMENT (IOC) LEFT PANOPTIX LENS 5.31 00:49.1 10.8% (Left)  LENS:   Implant Name Type Inv. Item Serial No. Manufacturer Lot No. LRB No. Used Action  LENS ACRYSOF TORIC TRIFO 17.0 - [id number]  LENS ACRYSOF TORIC TRIFO 17.0 [id number] ALCON  Left 1 Implanted      SURGEON:  Wyonia Hough, MD   ANESTHESIA:  Topical with tetracaine drops and 2% Xylocaine jelly, augmented with 1% preservative-free intracameral lidocaine.    COMPLICATIONS:  None.   DESCRIPTION OF PROCEDURE:  The patient was identified in the holding room and transported to the operating room and placed in the supine position under the operating microscope.  The left eye was identified as the operative eye and it was prepped and draped in the usual sterile ophthalmic fashion.   A 1 millimeter clear-corneal paracentesis was made at the 1:30 position.  0.5 ml of preservative-free 1% lidocaine was injected into the anterior chamber.  The anterior chamber was filled with Viscoat viscoelastic.  A 2.4 millimeter keratome was used to make a near-clear corneal incision at the 10:30 position.  .  A curvilinear capsulorrhexis was made with a cystotome and capsulorrhexis forceps.  Balanced salt solution was used to hydrodissect and hydrodelineate the nucleus.   Phacoemulsification was then used in stop and chop fashion to remove the lens nucleus and epinucleus.  The remaining cortex was then removed using the irrigation and aspiration handpiece. Provisc was then placed into the capsular bag to distend it for lens placement.  A lens was then injected into  the capsular bag.  The remaining viscoelastic was aspirated.   Wounds were hydrated with balanced salt solution.  The anterior chamber was inflated to a physiologic pressure with balanced salt solution.  No wound leaks were noted. .;cbcef   Timolol and Brimonidine drops were applied to the eye.  The patient was taken to the recovery room in stable condition without complications of anesthesia or surgery.  Ardenia Stiner 07/22/2020, 10:11 AM

## 2020-07-22 NOTE — Anesthesia Preprocedure Evaluation (Signed)
Anesthesia Evaluation  Patient identified by MRN, date of birth, ID band Patient awake    Reviewed: Allergy & Precautions, H&P , NPO status , Patient's Chart, lab work & pertinent test results, reviewed documented beta blocker date and time   History of Anesthesia Complications Negative for: history of anesthetic complications  Airway Mallampati: II  TM Distance: >3 FB Neck ROM: full    Dental no notable dental hx. (+)    Pulmonary    Pulmonary exam normal breath sounds clear to auscultation       Cardiovascular (-) angina(-) DOE Normal cardiovascular exam Rhythm:regular Rate:Normal   HLD   Neuro/Psych    GI/Hepatic neg GERD  ,  Endo/Other   Endometriosis  Renal/GU      Musculoskeletal   Abdominal   Peds  Hematology   Anesthesia Other Findings Vulvar cancer  Reproductive/Obstetrics                             Anesthesia Physical  Anesthesia Plan  ASA: II  Anesthesia Plan: MAC   Post-op Pain Management:    Induction: Intravenous  PONV Risk Score and Plan: 2 and Treatment may vary due to age or medical condition, TIVA and Midazolam  Airway Management Planned: Nasal Cannula  Additional Equipment:   Intra-op Plan:   Post-operative Plan:   Informed Consent: I have reviewed the patients History and Physical, chart, labs and discussed the procedure including the risks, benefits and alternatives for the proposed anesthesia with the patient or authorized representative who has indicated his/her understanding and acceptance.     Dental Advisory Given  Plan Discussed with: CRNA  Anesthesia Plan Comments:         Anesthesia Quick Evaluation

## 2020-07-23 ENCOUNTER — Encounter: Payer: Self-pay | Admitting: Ophthalmology

## 2020-07-29 IMAGING — US US EXTREM LOW*L* LIMITED
1 series · 14 of 21 positions shown · non-contrast
Comparison: None.

CLINICAL DATA: Lump on LEFT labia majora.  Suspect sebaceous cyst.

EXAM:
LIMITED ULTRASOUND OF PELVIS
TECHNIQUE: Limited transabdominal ultrasound examination of the pelvis was
performed.

[Series 1: us extrem low*left* limited · 0.06mm/px · 14 of 21 slices shown]
[im 1/21]
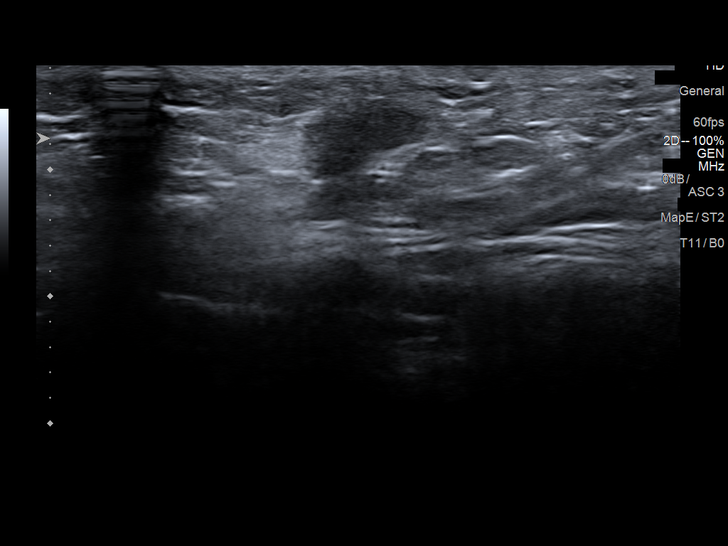
[im 3/21]
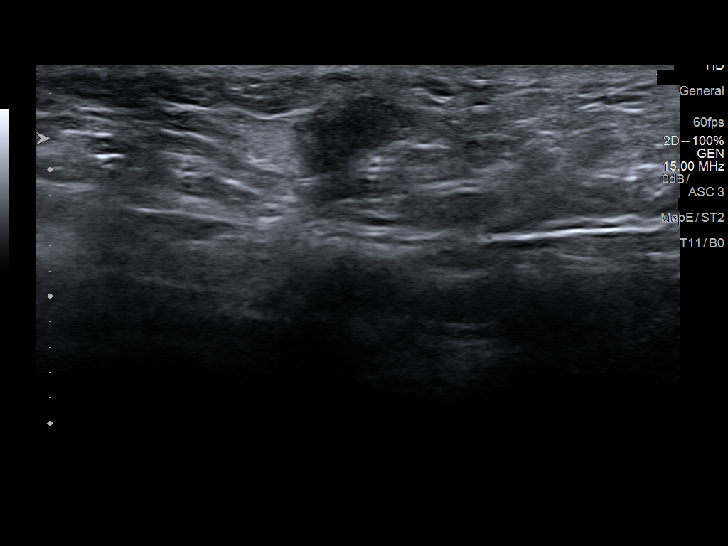
[im 4/21]
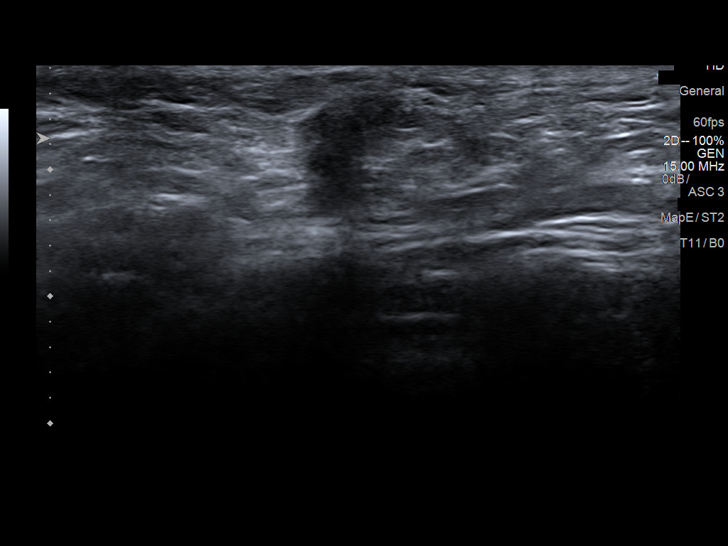
[im 6/21]
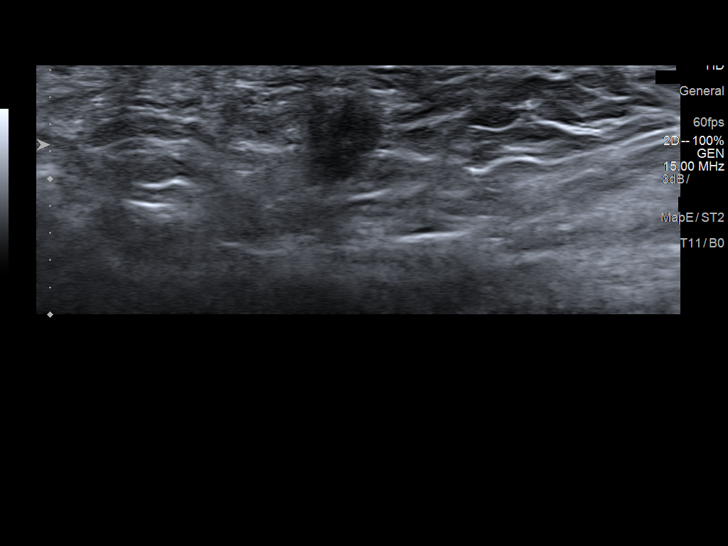
[im 7/21]
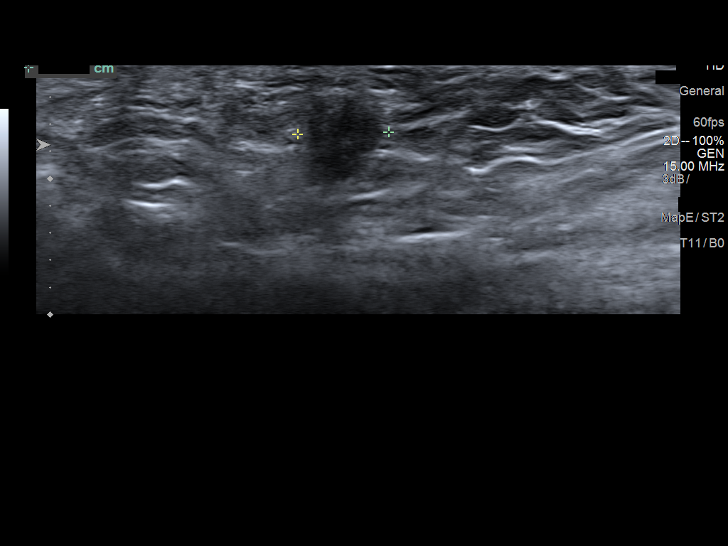
[im 9/21]
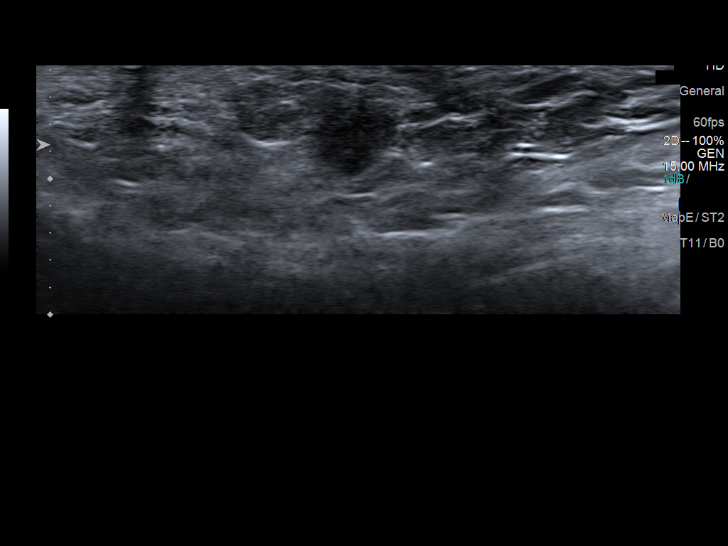
[im 10/21]
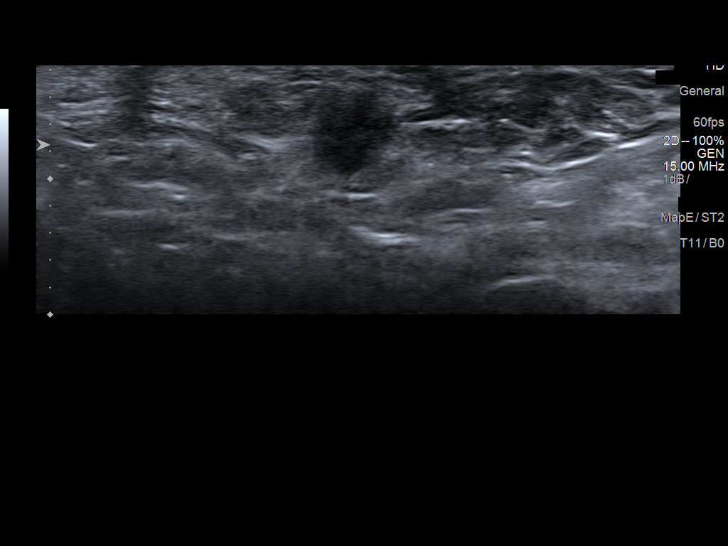
[im 12/21]
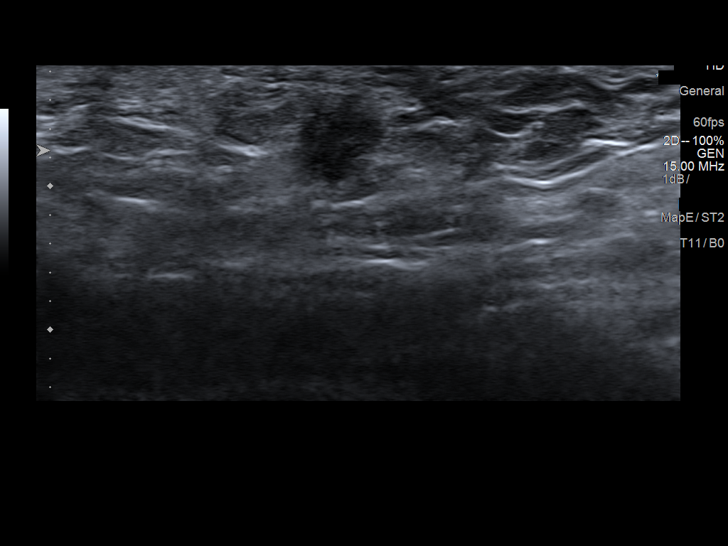
[im 13/21]
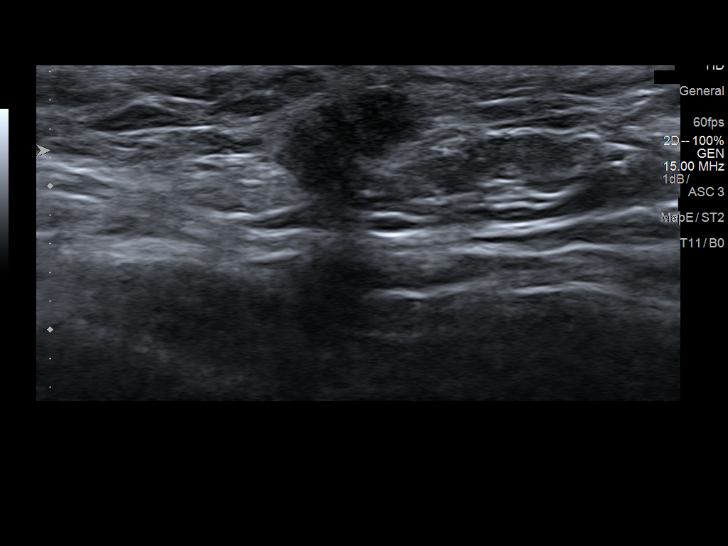
[im 15/21]
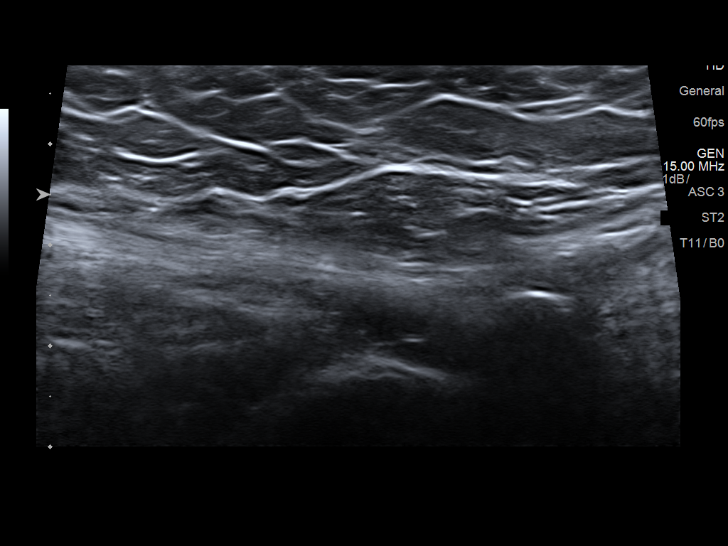
[im 16/21]
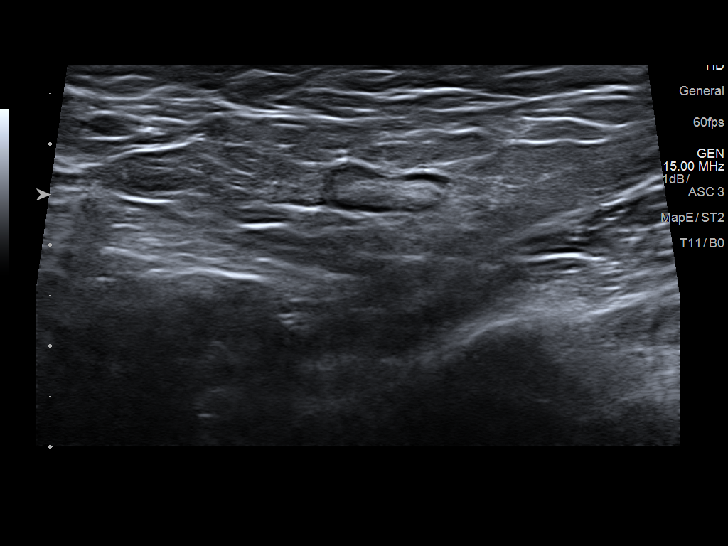
[im 18/21]
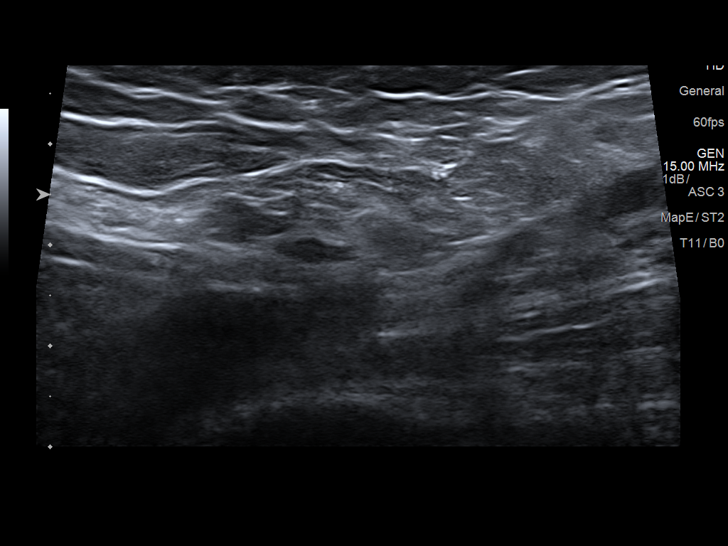
[im 19/21]
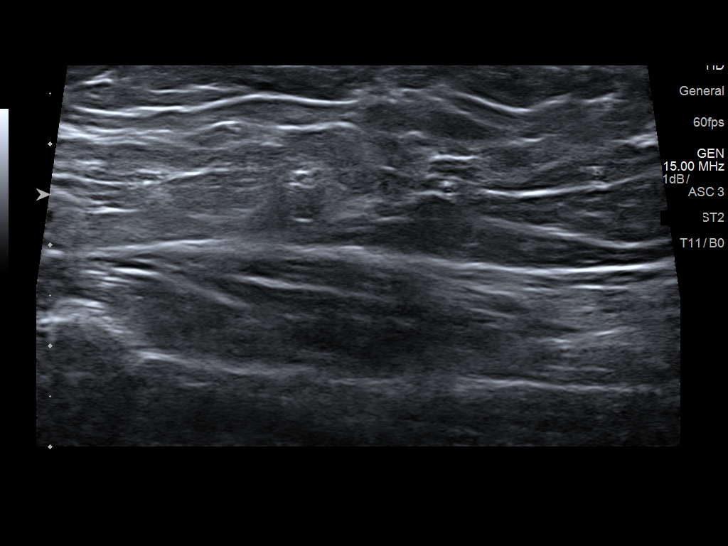
[im 21/21]
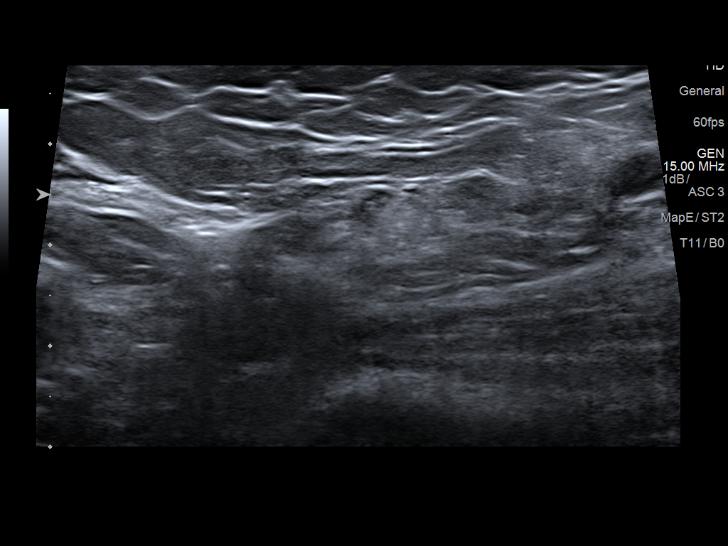

[14 of 21 positions shown; findings below may reference images not displayed]

FINDINGS: There is an irregular hypoechoic mass within the superficial soft
tissues of the LEFT labia, corresponding to the area of palpable
concern, measuring 1.1 x 0.6 x 0.7 cm. There is no tract seen to the
skin to suggest sebaceous cyst.

There are no enlarged or morphologically abnormal lymph nodes seen
within either groin region.
IMPRESSION: Irregular mass within the superficial soft tissues of the LEFT
labia, measuring 1.1 cm, corresponding to the area of palpable
concern. This mass is not suggestive of a sebaceous cyst. Neoplastic
mass is not excluded. Tissue sampling and/or surgical excision
recommended.

No lymphadenopathy within either groin region.

## 2020-08-04 ENCOUNTER — Ambulatory Visit (INDEPENDENT_AMBULATORY_CARE_PROVIDER_SITE_OTHER): Payer: Medicare Other | Admitting: Dermatology

## 2020-08-04 ENCOUNTER — Other Ambulatory Visit: Payer: Self-pay

## 2020-08-04 DIAGNOSIS — L814 Other melanin hyperpigmentation: Secondary | ICD-10-CM | POA: Diagnosis not present

## 2020-08-04 DIAGNOSIS — D1800 Hemangioma unspecified site: Secondary | ICD-10-CM | POA: Diagnosis not present

## 2020-08-04 DIAGNOSIS — L820 Inflamed seborrheic keratosis: Secondary | ICD-10-CM

## 2020-08-04 DIAGNOSIS — L821 Other seborrheic keratosis: Secondary | ICD-10-CM | POA: Diagnosis not present

## 2020-08-04 DIAGNOSIS — D229 Melanocytic nevi, unspecified: Secondary | ICD-10-CM | POA: Diagnosis not present

## 2020-08-04 DIAGNOSIS — Z1283 Encounter for screening for malignant neoplasm of skin: Secondary | ICD-10-CM | POA: Diagnosis not present

## 2020-08-04 DIAGNOSIS — D485 Neoplasm of uncertain behavior of skin: Secondary | ICD-10-CM

## 2020-08-04 DIAGNOSIS — D239 Other benign neoplasm of skin, unspecified: Secondary | ICD-10-CM

## 2020-08-04 DIAGNOSIS — D225 Melanocytic nevi of trunk: Secondary | ICD-10-CM

## 2020-08-04 DIAGNOSIS — L578 Other skin changes due to chronic exposure to nonionizing radiation: Secondary | ICD-10-CM

## 2020-08-04 HISTORY — DX: Other benign neoplasm of skin, unspecified: D23.9

## 2020-08-04 NOTE — Patient Instructions (Addendum)
Seborrheic Keratosis  What causes seborrheic keratoses? Seborrheic keratoses are harmless, common skin growths that first appear during adult life.  As time goes by, more growths appear.  Some people may develop a large number of them.  Seborrheic keratoses appear on both covered and uncovered body parts.  They are not caused by sunlight.  The tendency to develop seborrheic keratoses can be inherited.  They vary in color from skin-colored to gray, brown, or even black.  They can be either smooth or have a rough, warty surface.   Seborrheic keratoses are superficial and look as if they were stuck on the skin.  Under the microscope this type of keratosis looks like layers upon layers of skin.  That is why at times the top layer may seem to fall off, but the rest of the growth remains and re-grows.    Treatment Seborrheic keratoses do not need to be treated, but can easily be removed in the office.  Seborrheic keratoses often cause symptoms when they rub on clothing or jewelry.  Lesions can be in the way of shaving.  If they become inflamed, they can cause itching, soreness, or burning.  Removal of a seborrheic keratosis can be accomplished by freezing, burning, or surgery. If any spot bleeds, scabs, or grows rapidly, please return to have it checked, as these can be an indication of a skin cancer.  Wound Care Instructions  1. Cleanse wound gently with soap and water once a day then pat dry with clean gauze. Apply a thing coat of Petrolatum (petroleum jelly, "Vaseline") over the wound (unless you have an allergy to this). We recommend that you use a new, sterile tube of Vaseline. Do not pick or remove scabs. Do not remove the yellow or white "healing tissue" from the base of the wound.  2. Cover the wound with fresh, clean, nonstick gauze and secure with paper tape. You may use Band-Aids in place of gauze and tape if the would is small enough, but would recommend trimming much of the tape off as there is  often too much. Sometimes Band-Aids can irritate the skin.  3. You should call the office for your biopsy report after 1 week if you have not already been contacted.  4. If you experience any problems, such as abnormal amounts of bleeding, swelling, significant bruising, significant pain, or evidence of infection, please call the office immediately.  5. FOR ADULT SURGERY PATIENTS: If you need something for pain relief you may take 1 extra strength Tylenol (acetaminophen) AND 2 Ibuprofen (200mg  each) together every 4 hours as needed for pain. (do not take these if you are allergic to them or if you have a reason you should not take them.) Typically, you may only need pain medication for 1 to 3 days.   Melanoma ABCDEs  Melanoma is the most dangerous type of skin cancer, and is the leading cause of death from skin disease.  You are more likely to develop melanoma if you:  Have light-colored skin, light-colored eyes, or red or blond hair  Spend a lot of time in the sun  Tan regularly, either outdoors or in a tanning bed  Have had blistering sunburns, especially during childhood  Have a close family member who has had a melanoma  Have atypical moles or large birthmarks  Early detection of melanoma is key since treatment is typically straightforward and cure rates are extremely high if we catch it early.   The first sign of melanoma is often  a change in a mole or a new dark spot.  The ABCDE system is a way of remembering the signs of melanoma.  A for asymmetry:  The two halves do not match. B for border:  The edges of the growth are irregular. C for color:  A mixture of colors are present instead of an even brown color. D for diameter:  Melanomas are usually (but not always) greater than 32mm - the size of a pencil eraser. E for evolution:  The spot keeps changing in size, shape, and color.  Please check your skin once per month between visits. You can use a small mirror in front and a  large mirror behind you to keep an eye on the back side or your body.   If you see any new or changing lesions before your next follow-up, please call to schedule a visit.  Please continue daily skin protection including broad spectrum sunscreen SPF 30+ to sun-exposed areas, reapplying every 2 hours as needed when you're outdoors.   Cryotherapy Aftercare  . Wash gently with soap and water everyday.   Marland Kitchen Apply Vaseline and Band-Aid daily until healed.

## 2020-08-04 NOTE — Progress Notes (Signed)
Follow-Up Visit   Subjective  Barbara Gates is a 69 y.o. female who presents for the following: Annual Exam (Patient with itchy spots at back. ) Mole check today.      The following portions of the chart were reviewed this encounter and updated as appropriate:      Review of Systems:  No other skin or systemic complaints except as noted in HPI or Assessment and Plan.  Objective  Well appearing patient in no apparent distress; mood and affect are within normal limits.  A full examination was performed including scalp, head, eyes, ears, nose, lips, neck, chest, axillae, abdomen, back, buttocks, bilateral upper extremities, bilateral lower extremities, hands, feet, fingers, toes, fingernails, and toenails. All findings within normal limits unless otherwise noted below.  Objective  R Mid Back at braline x 3, R upper back at braline x 1, spinal lower back x 1 (5): Erythematous keratotic or waxy stuck-on papule   Objective  Right lower flank: 3.67mm medium dark brown macule R lower flank Multiple brown macules abdomen- see photo  Images      Objective  Right lateral buttock: 26mm brown macule with irregular pigment      Assessment & Plan  Inflamed seborrheic keratosis (5) R Mid Back at braline x 3, R upper back at braline x 1, spinal lower back x 1  Destruction of lesion - R Mid Back at braline x 3, R upper back at braline x 1, spinal lower back x 1 Complexity: simple   Destruction method: cryotherapy   Informed consent: discussed and consent obtained   Lesion destroyed using liquid nitrogen: Yes   Region frozen until ice ball extended beyond lesion: Yes   Outcome: patient tolerated procedure well with no complications   Post-procedure details: wound care instructions given    Nevus Right lower flank  Benign-appearing.  Observation.  Call clinic for new or changing moles.  Recommend daily use of broad spectrum spf 30+ sunscreen to sun-exposed areas.      Neoplasm of uncertain behavior of skin Right lateral buttock  Skin / nail biopsy Type of biopsy: tangential   Informed consent: discussed and consent obtained   Patient was prepped and draped in usual sterile fashion: Area prepped with alcohol. Anesthesia: the lesion was anesthetized in a standard fashion   Anesthetic:  1% lidocaine w/ epinephrine 1-100,000 buffered w/ 8.4% NaHCO3 Instrument used: flexible razor blade   Hemostasis achieved with: pressure, aluminum chloride and electrodesiccation   Outcome: patient tolerated procedure well   Post-procedure details: wound care instructions given   Post-procedure details comment:  Ointment and small bandage applied  Specimen 1 - Surgical pathology Differential Diagnosis: Nevus r/o Dysplasia Check Margins: No 14mm brown macule with irregular pigment  Nevus r/o dysplasia- biopsy today   Lentigines - Scattered tan macules at arms, back - Discussed due to sun exposure - Benign, observe - Call for any changes  Seborrheic Keratoses - Stuck-on, waxy, tan-brown papules and plaques at arms, back - Discussed benign etiology and prognosis. - Observe - Call for any changes  Melanocytic Nevi - Tan-brown and/or pink-flesh-colored symmetric macules and papules at back, abdomen, chest, breast - Benign appearing on exam today - Observation - Call clinic for new or changing moles - Recommend daily use of broad spectrum spf 30+ sunscreen to sun-exposed areas.   Hemangiomas - Red papules at back - Discussed benign nature - Observe - Call for any changes  Actinic Damage - diffuse scaly erythematous macules with underlying  dyspigmentation - Recommend daily broad spectrum sunscreen SPF 30+ to sun-exposed areas, reapply every 2 hours as needed.  - Call for new or changing lesions.  Skin cancer screening performed today.   Return in about 1 year (around 08/04/2021) for TBSE.  Graciella Belton, RMA, am acting as scribe for Brendolyn Patty, MD .  Documentation: I have reviewed the above documentation for accuracy and completeness, and I agree with the above.  Brendolyn Patty MD

## 2020-08-10 ENCOUNTER — Telehealth: Payer: Self-pay

## 2020-08-10 NOTE — Telephone Encounter (Signed)
Advised pt of bx results/sh ?

## 2020-08-10 NOTE — Telephone Encounter (Signed)
-----   Message from Brendolyn Patty, MD sent at 08/10/2020 12:43 PM EDT ----- Skin , right lateral buttock DYSPLASTIC COMPOUND NEVUS WITH MODERATE ATYPIA, LIMITED MARGINS FREE  Mole with some atypical cells, removed with biopsy, will observe and recheck on f/up

## 2020-08-26 DIAGNOSIS — Z23 Encounter for immunization: Secondary | ICD-10-CM | POA: Diagnosis not present

## 2020-09-02 ENCOUNTER — Encounter: Payer: Self-pay | Admitting: Internal Medicine

## 2020-09-02 ENCOUNTER — Ambulatory Visit (INDEPENDENT_AMBULATORY_CARE_PROVIDER_SITE_OTHER): Payer: Medicare Other | Admitting: Internal Medicine

## 2020-09-02 ENCOUNTER — Other Ambulatory Visit: Payer: Self-pay

## 2020-09-02 VITALS — BP 130/84 | HR 62 | Ht 67.5 in | Wt 138.0 lb

## 2020-09-02 DIAGNOSIS — R0789 Other chest pain: Secondary | ICD-10-CM

## 2020-09-02 DIAGNOSIS — R0600 Dyspnea, unspecified: Secondary | ICD-10-CM

## 2020-09-02 DIAGNOSIS — R0609 Other forms of dyspnea: Secondary | ICD-10-CM

## 2020-09-02 DIAGNOSIS — R0602 Shortness of breath: Secondary | ICD-10-CM

## 2020-09-02 NOTE — Progress Notes (Signed)
New Outpatient Visit Date: 09/02/2020  Primary Care provider: [facility] [address] [place],  [place] [address]  Chief Complaint: Shortness of breath  HPI:  Barbara Gates is a 69 y.o. female who is being seen today for the evaluation of shortness of breath as a self-referral. She has a history of hyperlipidemia and endometriosis. For several years, Barbara Gates has experienced shortness of breath with activity, such as climbing stairs.  At times, she also has a "funny wheeze" that multiple providers have been unable to explain.  At times, she also feels short of breath with associated tightness in her chest when talking for extended periods.  Barbara Gates was plagued by a chronic cough for several years that ultimately was attributed to tamoxifen and raloxifene.  Since stopping these medications, the cough has significantly improved.  She was also told in the past that GERD and sinus issues could have been contributing.  Barbara Gates underwent an exercise tolerance test >10 years ago at [facility], which she reports as being normal.  Barbara Gates deniespalpitations, lightheadedness, edema, orthopnea, and PND.  --------------------------------------------------------------------------------------------------  Cardiovascular History & Procedures: Cardiovascular Problems:  Shortness of breath and chest tightness  Risk Factors:  Hyperlipidemia and age greater than 65  Cath/PCI:  None  CV Surgery:  None  EP Procedures and Devices:  None  Non-Invasive Evaluation(s):  TTE (01/07/2020): Normal LV size with mild LVH.  LVEF 60-65% with grade 1 diastolic dysfunction.  Normal RV size and function.  No significant valvular abnormality.  Recent CV Pertinent Labs: Lab Results  Component Value Date   CHOL 207 (H) 11/22/2018   HDL 83 11/22/2018   LDLCALC 106 (H) 11/22/2018   LDLCALC 87 10/19/2017   TRIG 92 11/22/2018   CHOLHDL 2.5 11/22/2018    CHOLHDL 2.1 10/19/2017   INR 1.01 08/10/2018   K 4.1 02/04/2020   MG 2.3 02/04/2020   BUN 15 02/04/2020   CREATININE 0.81 02/04/2020   CREATININE 0.83 10/19/2017    --------------------------------------------------------------------------------------------------  Past Medical History:  Diagnosis Date  . Dyspnea    with exertion  . Endometriosis   . 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 Fransisca Connors  . Hypercalcemia   . Hyperlipidemia    Has history of this  . Motion sickness   . Nodule of vagina   . Osteoporosis   . Osteoporosis   . Redundant colon     Past Surgical History:  Procedure Laterality Date  . ABDOMINAL HYSTERECTOMY  1994   Total including ovarie. Due to endometriosis  . APPENDECTOMY     with hysterectomy   . BREAST BIOPSY Left 11/08/2012   Dr. Bary Castilla, neg  . CATARACT EXTRACTION W/PHACO Right 05/27/2020   Procedure: CATARACT EXTRACTION PHACO AND INTRAOCULAR LENS PLACEMENT ([place]) RIGHT PANOPTIC LENS;  Surgeon: Leandrew Koyanagi, MD;  Location: [place];  Service: Ophthalmology;  Laterality: Right;  2.14 0:43.8 4.8%  . CATARACT EXTRACTION W/PHACO Left 07/22/2020   Procedure: CATARACT EXTRACTION PHACO AND INTRAOCULAR LENS PLACEMENT ([facility]) LEFT PANOPTIX LENS 5.31 00:49.1 10.8%;  Surgeon: Leandrew Koyanagi, MD;  Location: [place];  Service: Ophthalmology;  Laterality: Left;  . granular cell tumor removed    . OTHER SURGICAL HISTORY    . Surgical Lipoma Removal; 12/13/2012  12/13/2012  . VULVAR LESION REMOVAL Left 08/22/2018   Procedure: Excision left vulvar mass, possible sentinel node mapping with blue dye, possible left inguinal lymph node biopsy or dissection;  Surgeon: Mellody Drown, MD;  Location:  [facility];  Service: Gynecology;  Laterality: Left;    Current Meds  Medication Sig  . Cholecalciferol (VITAMIN D3) 50 MCG (2000 UT) capsule Take 2,000 Units by mouth daily.  . Magnesium (V-R MAGNESIUM) 250 MG TABS Take by mouth.  .  Multiple Vitamins-Minerals (OCUVITE-LUTEIN PO) Take by mouth.  Marland Kitchen PHOSPHATIDYLSERINE PO 100 mg Herbal Name: Phosphatidylserine  . Polyethyl Glycol-Propyl Glycol (SYSTANE ULTRA) 0.4-0.3 % SOLN Apply to eye.  . Polyethylene Glycol 3350 (MIRALAX PO) Take 17 g by mouth daily.   Marland Kitchen senna-docusate (SENOKOT-S) 8.6-50 MG tablet Take 2 tablets by mouth every evening.   . Turmeric 450 MG CAPS Take 550 mg by mouth every evening.   . vitamin B-12 (CYANOCOBALAMIN) 500 MCG tablet Take 500 mcg by mouth daily. Quick dissolve (cherry)    Allergies: Other, Raloxifene hcl, Tamoxifen, and Mango flavor  Social History   Tobacco Use  . Smoking status: Never Smoker  . Smokeless tobacco: Never Used  Vaping Use  . Vaping Use: Never used  Substance Use Topics  . Alcohol use: Yes    Alcohol/week: 2.0 standard drinks    Types: 1 Glasses of wine, 1 Standard drinks or equivalent per week    Comment: 1/2 margarita on Sat  . Drug use: No    Family History  Problem Relation Age of Onset  . Cancer Mother        lung, pancreas, melanoma, carcinoid tumors  . Hypertension Mother   . Hyperlipidemia Mother   . Diabetes Mother   . Hypertension Father   . Diabetes Father   . Hyperlipidemia Father   . Parkinson's disease Father   . CAD Father   . Vascular Disease Father        "hardening of the arteries"  . Diabetes Brother   . Hypertension Brother   . Breast cancer Sister 71  . Arthritis Sister   . Hypertension Sister   . Dupuytren's contracture Sister     Review of Systems: A 12-system review of systems was performed and was negative except as noted in the HPI.  --------------------------------------------------------------------------------------------------  Physical Exam: BP 130/84 (BP Location: Right Arm, Patient Position: Sitting, Cuff Size: Normal)   Pulse 62   Ht 5' 7.5" (1.715 m)   Wt 138 lb (62.6 kg)   SpO2 98%   BMI 21.29 kg/m   General:  NAD. HEENT: No conjunctival pallor or scleral  icterus. Facemask in place. Neck: Supple without lymphadenopathy, thyromegaly, JVD, or HJR. No carotid bruit. Lungs: Normal work of breathing. Clear to auscultation bilaterally without wheezes or crackles. Heart: Regular rate and rhythm without murmurs, rubs, or gallops. Non-displaced PMI. Abd: Bowel sounds present. Soft, NT/ND without hepatosplenomegaly Ext: No lower extremity edema. Radial, PT, and DP pulses are 2+ bilaterally Skin: Warm and dry without rash. Neuro: CNIII-XII intact. Strength and fine-touch sensation intact in upper and lower extremities bilaterally. Psych: Normal mood and affect.  EKG:  Normal sinus rhythm without abnormality.  Lab Results  Component Value Date   WBC 5.9 02/04/2020   HGB 13.8 02/04/2020   HCT 41.4 02/04/2020   MCV 92 02/04/2020   PLT 243 02/04/2020    Lab Results  Component Value Date   NA 138 02/04/2020   K 4.1 02/04/2020   CL 99 02/04/2020   CO2 23 02/04/2020   BUN 15 02/04/2020   CREATININE 0.81 02/04/2020   GLUCOSE 89 02/04/2020   ALT 12 02/04/2020    Lab Results  Component Value Date   CHOL 207 (  H) 11/22/2018   HDL 83 11/22/2018   LDLCALC 106 (H) 11/22/2018   TRIG 92 11/22/2018   CHOLHDL 2.5 11/22/2018     --------------------------------------------------------------------------------------------------  ASSESSMENT AND PLAN: Dyspnea on exertion and chest tightness: Symptoms have been present for a few years and initially were accompanied by a chronic cough.  Though the cough has improved, dyspnea persists.  Examination is unremarkable other than unusual Carmine Youngberg-expiratory wheeze with forceful exhalation.  Notably, PFT's last year were normal  EKG today is normal.  Echocardiogram in February was largely unremarkable, noting only mild diastolic dysfunction and LVH).  I have recommended that we obtain an exercise tolerance to assess for underlying ischemic heart disease.  If this is normal and symptoms persist, cross-sectional imaging  of the chest may be necessary to exclude a vascular anomaly, as a vascular ring could cause tracheal compression leading to dyspnea and wheezing.  Follow-up: Return to clinic in 6 weeks.  Nelva Bush, MD 09/02/2020 12:17 PM

## 2020-09-02 NOTE — Patient Instructions (Signed)
Medication Instructions:  Your physician recommends that you continue on your current medications as directed. Please refer to the Current Medication list given to you today.  *If you need a refill on your cardiac medications before your next appointment, please call your pharmacy*   Lab Work: COVID PRE- TEST: You will need a COVID TEST prior to the procedure:  LOCATION: [address] site.  DATE/TIME:  ___________________Anytime between 8 am or 1 pm.   If you have labs (blood work) drawn today and your tests are completely normal, you will receive your results only by: Marland Kitchen MyChart Message (if you have MyChart) OR . A paper copy in the mail If you have any lab test that is abnormal or we need to change your treatment, we will call you to review the results.   Testing/Procedures: Your physician has requested that you have an exercise tolerance test. For further information please visit [email or web address]. Please also follow instruction sheet, as given.   DO NOT drink or eat foods with caffeine for 24 hours before the test. (Chocolate, coffee, tea, decaf coffee/tea, or energy drinks)  DO NOT smoke for 4 hours before your test.  If you use an inhaler, bring it with you to the test.  Wear comfortable shoes and clothing. Women do not wear dresses.   Follow-Up: At [facility], you and your health needs are our priority.  As part of our continuing mission to provide you with exceptional heart care, we have created designated Provider Care Teams.  These Care Teams include your primary Cardiologist (physician) and Advanced Practice Providers (APPs -  Physician Assistants and Nurse Practitioners) who all work together to provide you with the care you need, when you need it.  We recommend signing up for the patient portal called "MyChart".  Sign up information is provided on this After Visit Summary.  MyChart is used to connect with patients for Virtual Visits  (Telemedicine).  Patients are able to view lab/test results, encounter notes, upcoming appointments, etc.  Non-urgent messages can be sent to your provider as well.   To learn more about what you can do with MyChart, go to [email or web address].    Your next appointment:   6 week(s)  The format for your next appointment:   In Person  Provider:   You may see DR Harrell Gave END or one of the following Advanced Practice Providers on your designated Care Team:    Murray Hodgkins, NP  Christell Faith, PA-C  Marrianne Mood, PA-C  Cadence Kathlen Mody, [place]     Exercise Stress Test An exercise stress test is a test to check how your heart works during exercise. You will need to walk on a treadmill or ride an exercise bike for this test. An electrocardiogram (ECG) will record your heartbeat when you are at rest and when you are exercising. You may have an ultrasound or nuclear test after the exercise test. The test is done to check for coronary artery disease (CAD). It is also done to:  See how well you can exercise.  Watch for high blood pressure during exercise.  Test how well you can exercise after treatment.  Check the blood flow to your arms and legs. If your test result is not normal, more testing may be needed. What happens before the procedure?  Follow instructions from your doctor about what you cannot eat or drink. ? Do not have any drinks or foods that have caffeine in them for 24 hours before the  test, or as told by your doctor. This includes coffee, tea (even decaf tea), sodas, chocolate, and cocoa.  Ask your doctor about changing or stopping your normal medicines. This is important if you: ? Take diabetes medicines. ? Take beta-blocker medicines. ? Wear a nitroglycerin patch.  If you use an inhaler, bring it with you to the test.  Do not put lotions, powders, creams, or oils on your chest before the test.  Wear comfortable shoes and clothing.  Do not use any products  that have nicotine or tobacco in them, such as cigarettes and e-cigarettes. Stop using them at least 4 hours before the test. If you need help quitting, ask your doctor. What happens during the procedure?   Patches (electrodes) will be put on your chest.  Wires will be connected to the patches. The wires will send signals to a machine to record your heartbeat.  Your heart rate will be watched while you are resting and while you are exercising. Your blood pressure will also be watched during the test.  You will walk on a treadmill or use a stationary bike. If you cannot use these, you may be asked to turn a crank with your hands.  The activity will get harder and will raise your heart rate.  You may be asked to breathe into a tube a few times during the test. This measures the gases that you breathe out.  You will be asked how you are feeling throughout the test.  You will exercise until your heart reaches a target heart rate. You will stop early if: ? You feel dizzy. ? You have chest pain. ? You are out of breath. ? Your blood pressure is too high or too low. ? You have an irregular heartbeat. ? You have pain or aching in your arms or legs. The procedure may vary among doctors and hospitals. What happens after the procedure?  Your blood pressure, heart rate, breathing rate, and blood oxygen level will be watched after the test.  You may return to your normal diet and activities as told by your doctor.  It is up to you to get the results of your test. Ask your doctor, or the department that is doing the test, when your results will be ready. Summary  An exercise stress test is a test to check how your heart works during exercise.  This test is done to check for coronary artery disease.  Your heart rate will be watched while you are resting and while you are exercising.  Follow instructions from your doctor about what you cannot eat or drink before the test. This information  is not intended to replace advice given to you by your health care provider. Make sure you discuss any questions you have with your health care provider. Document Revised: 02/19/2019 Document Reviewed: 02/07/2017 Elsevier Patient Education  [place].

## 2020-09-03 ENCOUNTER — Encounter: Payer: Self-pay | Admitting: Internal Medicine

## 2020-09-03 ENCOUNTER — Other Ambulatory Visit
Admission: RE | Admit: 2020-09-03 | Discharge: 2020-09-03 | Disposition: A | Discharge status: Home / Self Care | Payer: Medicare Other | Source: Ambulatory Visit | Attending: Internal Medicine | Admitting: Internal Medicine

## 2020-09-03 DIAGNOSIS — Z01812 Encounter for preprocedural laboratory examination: Secondary | ICD-10-CM | POA: Insufficient documentation

## 2020-09-03 DIAGNOSIS — R0789 Other chest pain: Secondary | ICD-10-CM | POA: Insufficient documentation

## 2020-09-03 DIAGNOSIS — Z20822 Contact with and (suspected) exposure to covid-19: Secondary | ICD-10-CM | POA: Insufficient documentation

## 2020-09-03 LAB — SARS CORONAVIRUS 2 (TAT 6-24 HRS): SARS Coronavirus 2: NEGATIVE

## 2020-09-04 ENCOUNTER — Other Ambulatory Visit: Payer: Self-pay

## 2020-09-04 ENCOUNTER — Ambulatory Visit (INDEPENDENT_AMBULATORY_CARE_PROVIDER_SITE_OTHER): Payer: Medicare Other

## 2020-09-04 DIAGNOSIS — R0600 Dyspnea, unspecified: Secondary | ICD-10-CM

## 2020-09-04 DIAGNOSIS — R0789 Other chest pain: Secondary | ICD-10-CM

## 2020-09-04 DIAGNOSIS — R0609 Other forms of dyspnea: Secondary | ICD-10-CM

## 2020-09-08 ENCOUNTER — Telehealth: Payer: Self-pay | Admitting: Internal Medicine

## 2020-09-08 DIAGNOSIS — R9430 Abnormal result of cardiovascular function study, unspecified: Secondary | ICD-10-CM

## 2020-09-08 DIAGNOSIS — R079 Chest pain, unspecified: Secondary | ICD-10-CM

## 2020-09-08 DIAGNOSIS — R931 Abnormal findings on diagnostic imaging of heart and coronary circulation: Secondary | ICD-10-CM

## 2020-09-08 LAB — EXERCISE TOLERANCE TEST
Estimated workload: 8.2 METS
Exercise duration (min): 6 min
Exercise duration (sec): 50 s
MPHR: 152 {beats}/min
Peak HR: 133 {beats}/min
Percent HR: 87 %
Rest HR: 61 {beats}/min

## 2020-09-08 MED ORDER — ASPIRIN EC 81 MG PO TBEC: 81.0000 mg | DELAYED_RELEASE_TABLET | Freq: Every day | ORAL | Status: DC

## 2020-09-08 NOTE — Telephone Encounter (Signed)
I spoke with Barbara Gates regarding the results of her GXT, which was intermediate risk.  She reports feeling tired after the stress test and having transient "pinprick" chest pain the following day.  Given her continued albeit atypical symptoms and intermediate risk stress test, we discussed further evaluation with cardiac catheterization versus coronary CTA.  Barbara Gates would like to proceed with CTA.  We will make arrangements for this to be done at her earliest convenience.  In the meantime, I have recommended that she begin taking ASA 81 mg daily.  Nelva Bush, MD [facility]

## 2020-09-09 NOTE — Addendum Note (Signed)
Addended by: Annia Belt on: 09/09/2020 12:17 PM   Modules accepted: Orders

## 2020-09-10 NOTE — Addendum Note (Signed)
Addended by: Annia Belt on: 09/10/2020 04:48 PM   Modules accepted: Orders

## 2020-09-10 NOTE — Telephone Encounter (Signed)
Called patient to go over instructions and Cardiac CTA process. She is currently visiting her family in [place] and is out to eat at [employer]. Advised her I would send MyChart message with pre-procedural instructions and when she returns from her trip to call me to discuss. She is aware that she may receive a call to schedule the appointment.   The following posted to patient's MyChart:  Your cardiac CT will be scheduled at one of the below locations:   [facility] [address] [place], [place] [address] [phone]  [facility] [address] [place], [place] [address] [phone]  If scheduled at [facility], please arrive at the [facility] main entrance of [facility] 30 minutes prior to test start time. Proceed to the [facility] Radiology Department (first floor) to check-in and test prep.  If scheduled at [facility], please arrive 15 mins early for check-in and test prep.  Please follow these instructions carefully (unless otherwise directed):  On the Night Before the Test:  Be sure to Drink plenty of water.  Do not consume any caffeinated/decaffeinated beverages or chocolate 12 hours prior to your test.  Do not take any antihistamines 12 hours prior to your test.  On the Day of the Test:  Drink plenty of water. Do not drink any water within one hour of the test.  Do not eat any food 4 hours prior to the test.  You may take your regular medications prior to the test.   Take metoprolol (Lopressor) two hours prior to test.  FEMALES- please wear underwire-free bra if available  After the Test:  Drink plenty of water.  After receiving IV contrast, you may experience a mild flushed feeling. This is normal.  On occasion, you may experience a mild rash up to 24 hours after the test. This is not dangerous. If this occurs, you can take Benadryl 25 mg and  increase your fluid intake.  If you experience trouble breathing, this can be serious. If it is severe call 911 IMMEDIATELY. If it is mild, please call our office.  If you take any of these medications: Glipizide/Metformin, Avandament, Glucavance, please do not take 48 hours after completing test unless otherwise instructed.   Once we have confirmed authorization from your insurance company, we will call you to set up a date and time for your test. Based on how quickly your insurance processes prior authorizations requests, please allow up to 4 weeks to be contacted for scheduling your Cardiac CT appointment. Be advised that routine Cardiac CT appointments could be scheduled as many as 8 weeks after your provider has ordered it.  For non-scheduling related questions, please contact the cardiac imaging nurse navigator should you have any questions/concerns: Marchia Bond, Cardiac Imaging Nurse Navigator Burley Saver, Interim Cardiac Imaging Nurse [place] [facility] Direct Office Dial: [phone]   For scheduling needs, including cancellations and rescheduling, please call Vivien Rota at [phone], option 3.

## 2020-09-14 MED ORDER — METOPROLOL TARTRATE 50 MG PO TABS: 50.0000 mg | ORAL_TABLET | Freq: Once | ORAL | 0 refills | Status: DC

## 2020-09-14 NOTE — Telephone Encounter (Signed)
Patient verbalized understanding of the pre-procedural instructions. She is planning to print them off [employer].  Her only concern was that her heart rate has been historically low even less than 60. At her last office visit it was 23. In looking through her chart, it is noted that heart rate is sometimes in the 50's to low 60's. She is concerned about taking the high dose of metoprolol prior to the Cardiac CTA. Advised I will make Dr End aware of her concerns and get back with her with his recommendations.   According to the protocol, patient would receive Lopressor 100 mg 2 hours prior to the Cardiac CTA.

## 2020-09-14 NOTE — Telephone Encounter (Signed)
Given heart rate concerns, I think it is reasonable for her to take metoprolol tartrate 50 mg 2 hours before CTA.  However, she should be aware that if her HR at the time of the scan is too high, it may need to be rescheduled after she takes a higher dose of metoprolol.

## 2020-09-14 NOTE — Addendum Note (Signed)
Addended by: Annia Belt on: 09/14/2020 11:44 AM   Modules accepted: Orders

## 2020-09-14 NOTE — Telephone Encounter (Signed)
Patient aware and verbalized understanding of Dr Darnelle Bos recommendations. She feels better with taking the lopressor 50 mg prior to CTA. Rx sent.

## 2020-09-22 ENCOUNTER — Telehealth: Payer: Self-pay

## 2020-09-22 NOTE — Telephone Encounter (Signed)
Copied from [place] [id number]. Topic: Appointment Scheduling - Scheduling Inquiry for Clinic >> Sep 22, 2020  4:02 PM Rainey Pines A wrote: Patient would like a callback to reschedule AWV

## 2020-09-28 ENCOUNTER — Other Ambulatory Visit
Admission: RE | Admit: 2020-09-28 | Discharge: 2020-09-28 | Disposition: A | Discharge status: Home / Self Care | Payer: Medicare Other | Source: Ambulatory Visit | Attending: Internal Medicine | Admitting: Internal Medicine

## 2020-09-28 DIAGNOSIS — R9430 Abnormal result of cardiovascular function study, unspecified: Secondary | ICD-10-CM | POA: Diagnosis not present

## 2020-09-28 DIAGNOSIS — R079 Chest pain, unspecified: Secondary | ICD-10-CM | POA: Diagnosis not present

## 2020-09-28 DIAGNOSIS — R931 Abnormal findings on diagnostic imaging of heart and coronary circulation: Secondary | ICD-10-CM | POA: Diagnosis not present

## 2020-09-28 LAB — BASIC METABOLIC PANEL
Anion gap: 7 (ref 5–15)
BUN: 16 mg/dL (ref 8–23)
CO2: 29 mmol/L (ref 22–32)
Calcium: 9.5 mg/dL (ref 8.9–10.3)
Chloride: 103 mmol/L (ref 98–111)
Creatinine, Ser: 0.86 mg/dL (ref 0.44–1.00)
GFR, Estimated: >60 mL/min (ref >60)
Glucose, Bld: 176 mg/dL — ABNORMAL HIGH (ref 70–99)
Potassium: 3.7 mmol/L (ref 3.5–5.1)
Sodium: 139 mmol/L (ref 135–145)

## 2020-09-28 NOTE — Telephone Encounter (Signed)
Telephonic AWV r/s to 11/04/20 @ 2:40 PM.

## 2020-09-29 ENCOUNTER — Telehealth (HOSPITAL_COMMUNITY): Payer: Self-pay | Admitting: *Deleted

## 2020-09-29 NOTE — Telephone Encounter (Signed)
Reaching out to patient to offer assistance regarding upcoming cardiac imaging study; pt verbalizes understanding of appt date/time, parking situation and where to check in, pre-test NPO status and medications ordered, and verified current allergies; name and call back number provided for further questions should they arise ° °Lance Huaracha Tai RN Navigator Cardiac Imaging °[facility] °[phone] office °[phone] cell ° °

## 2020-09-30 ENCOUNTER — Ambulatory Visit
Admission: RE | Admit: 2020-09-30 | Discharge: 2020-09-30 | Disposition: A | Discharge status: Home / Self Care | Payer: Medicare Other | Source: Ambulatory Visit | Attending: Internal Medicine | Admitting: Internal Medicine

## 2020-09-30 ENCOUNTER — Other Ambulatory Visit: Payer: Self-pay

## 2020-09-30 DIAGNOSIS — R9430 Abnormal result of cardiovascular function study, unspecified: Secondary | ICD-10-CM

## 2020-09-30 DIAGNOSIS — R079 Chest pain, unspecified: Secondary | ICD-10-CM | POA: Insufficient documentation

## 2020-09-30 MED ORDER — IOHEXOL 350 MG/ML SOLN
75.0000 mL | Freq: Once | INTRAVENOUS | Status: AC | PRN
  Administered 2020-09-30: 75 mL via INTRAVENOUS

## 2020-09-30 MED ORDER — NITROGLYCERIN 0.4 MG SL SUBL
0.8000 mg | SUBLINGUAL_TABLET | Freq: Once | SUBLINGUAL | Status: AC
  Administered 2020-09-30: 0.8 mg via SUBLINGUAL

## 2020-09-30 NOTE — Progress Notes (Signed)
Patient tolerated CT well. Drinking water sitting in chair. Vital signs stable encourage to drink water throughout day.Reasons explained and verbalized understanding. Ambulated steady gait.   

## 2020-10-14 ENCOUNTER — Ambulatory Visit (INDEPENDENT_AMBULATORY_CARE_PROVIDER_SITE_OTHER): Payer: Medicare Other | Admitting: Family

## 2020-10-14 ENCOUNTER — Encounter: Payer: Self-pay | Admitting: Family

## 2020-10-14 ENCOUNTER — Other Ambulatory Visit: Payer: Self-pay

## 2020-10-14 VITALS — BP 144/76 | HR 62 | Ht 67.5 in | Wt 140.0 lb

## 2020-10-14 DIAGNOSIS — E785 Hyperlipidemia, unspecified: Secondary | ICD-10-CM

## 2020-10-14 DIAGNOSIS — R0609 Other forms of dyspnea: Secondary | ICD-10-CM

## 2020-10-14 DIAGNOSIS — R0600 Dyspnea, unspecified: Secondary | ICD-10-CM

## 2020-10-14 DIAGNOSIS — I25118 Atherosclerotic heart disease of native coronary artery with other forms of angina pectoris: Secondary | ICD-10-CM

## 2020-10-14 NOTE — Progress Notes (Signed)
Office Visit    Patient Name: Barbara Gates Date of Encounter: 10/14/2020  Primary Care Provider:  Mar Daring, PA-C Primary Cardiologist:  Nelva Bush, MD Electrophysiologist:  None   Chief Complaint    Barbara Gates is a 69 y.o. female with a hx of mild nonobstructive CAD, hyperlipidemia, endometriosis, shortness of breath, GERD presents today for follow-up after cardiac CTA  Past Medical History    Past Medical History:  Diagnosis Date  . Dyspnea    with exertion  . Endometriosis   . 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 Fransisca Connors  . Hypercalcemia   . Hyperlipidemia    Has history of this  . Motion sickness   . Nodule of vagina   . Osteoporosis   . Osteoporosis   . Redundant colon    Past Surgical History:  Procedure Laterality Date  . ABDOMINAL HYSTERECTOMY  1994   Total including ovarie. Due to endometriosis  . APPENDECTOMY     with hysterectomy   . BREAST BIOPSY Left 11/08/2012   Dr. Bary Castilla, neg  . CATARACT EXTRACTION W/PHACO Right 05/27/2020   Procedure: CATARACT EXTRACTION PHACO AND INTRAOCULAR LENS PLACEMENT ([place]) RIGHT PANOPTIC LENS;  Surgeon: Leandrew Koyanagi, MD;  Location: [facility];  Service: Ophthalmology;  Laterality: Right;  2.14 0:43.8 4.8%  . CATARACT EXTRACTION W/PHACO Left 07/22/2020   Procedure: CATARACT EXTRACTION PHACO AND INTRAOCULAR LENS PLACEMENT ([facility]) LEFT PANOPTIX LENS 5.31 00:49.1 10.8%;  Surgeon: Leandrew Koyanagi, MD;  Location: [facility];  Service: Ophthalmology;  Laterality: Left;  . granular cell tumor removed    . OTHER SURGICAL HISTORY    . Surgical Lipoma Removal; 12/13/2012  12/13/2012  . VULVAR LESION REMOVAL Left 08/22/2018   Procedure: Excision left vulvar mass, possible sentinel node mapping with blue dye, possible left inguinal lymph node biopsy or dissection;  Surgeon: Mellody Drown, MD;  Location: [facility];  Service: Gynecology;  Laterality: Left;    Allergies  Allergies  Allergen Reactions  . Other Hives    selamectin active ingredient in Revolution Plus for cats.  . Raloxifene Hcl Cough       . Tamoxifen Cough  . Mango Flavor Itching and Rash    History of Present Illness    Barbara Gates is a 69 y.o. female with a hx of mild nonobstructive CAD, hyperlipidemia, endometriosis, shortness of breath, GERD last seen 09/02/2020 by Dr. Saunders Revel.  She was evaluated by Dr. Saunders Revel 09/02/2020 due to shortness of breath with activities such as climbing stairs.  She also noted a "funny wheeze ".  She had PFTs 06/2019 which were unremarkable.  Echo 01/07/2020 with LVEF 60 to 65%, no wall motion abnormalities, mild LVH, grade 1 diastolic dysfunction, no significant valvular abnormalities.  History of chronic cough that was attributed to tamoxifen and raloxifene with significant improvement after discontinuation of his medications.  Her EKG in clinic 09/02/2020 was normal.  She was recommended for GXT.  Subsequent GXT was intermediate risk with 1 mm of upsloping ST depression in inferolateral leads at peak stress, rare PVCs.  She was started on aspirin 81 mg daily.Marland Kitchen  She was recommended for coronary CTA.  Subsequent cardiac CTA 09/30/2020 with coronary calcium score 76.2 placing her in the 73rd percentile.  She had mild calcified plaque in the proximal to mid LAD with minimal stenosis (less than 25%).  We reviewed her GXT and subsequent cardiac CTA.  Discussed secondary prevention of cardiovascular disease including medical therapy and lifestyle changes.  She has a strong family history of coronary disease. Shares with me that her younger sister recently started cholesterol medication. She is hesitant regarding cholesterol medications, has never taken previously. She endorses eating a heart healthy diet and has recently reduced her intake of carbohydrates and notices weight loss. Enjoys eating salmon, oatmeal, walnuts for heart healthy options. She has no  formal exercise routine but does walk her stairs at home multiple times per day.  She denies chest pain, pressure, tightness.  Reports dyspnea on exertion when walking up the stairs while talking her phone which is at her baseline.   EKGs/Labs/Other Studies Reviewed:   The following studies were reviewed today:  ETT 09/04/20    Baseline EKG demonstrates normal sinus rhythm without significant abnormalities.  The patient exhibited fair exercise capacy. She did not experience angina during stress. Heart rate and blood pressure responses were normal.  There is 1 mm of upsloping ST depression in the inferolateral leads at peak stress.  Rare PVC's were observed during stress. No significant arrhythmia occurred.  [facility] treadmill score = +2 (intermediate risk)   Mild inferolateral ST depression noted at peak stress with associated shortness of breath.  This is an intermediate risk stress test.  Cardiac CTA   IMPRESSION: 1. Coronary calcium score of 76.2. This was 73rd percentile for age and sex matched control.   2. Normal coronary origin with right dominance.   3. Mild calcified plaque in the proximal to mid LAD causing minimal stenosis (<25%)   4. CAD-RADS 1. Minimal non-obstructive CAD (0-24%). Consider non-atherosclerotic causes of chest pain. Consider preventive therapy and risk factor modification.  EKG:  No EKG is ordered today.  The ekg independently reviewed from 09/02/20 demonstrated NSR 62 bpm with no acute ST/T wave changes.  Recent Labs: 02/04/2020: ALT 12; Hemoglobin 13.8; Magnesium 2.3; Platelets 243; TSH 4.300 09/28/2020: BUN 16; Creatinine, Ser 0.86; Potassium 3.7; Sodium 139  Recent Lipid Panel    Component Value Date/Time   CHOL 207 (H) 11/22/2018 0921   TRIG 92 11/22/2018 0921   HDL 83 11/22/2018 0921   CHOLHDL 2.5 11/22/2018 0921   CHOLHDL 2.1 10/19/2017 1012   LDLCALC 106 (H) 11/22/2018 0921   LDLCALC 87 10/19/2017 1012    Home Medications   Current  Meds  Medication Sig  . Alendronate Sodium 70 MG TBEF Take 1 tablet by mouth once a week.  Marland Kitchen aspirin 81 MG EC tablet Take 81 mg by mouth 2 (two) times a week. Swallow whole.  . Cholecalciferol (VITAMIN D3) 50 MCG (2000 UT) capsule Take 2,000 Units by mouth daily.  Javier Docker Oil 500 MG CAPS Take by mouth. Two caps 500 mg each daily  . Lifitegrast (XIIDRA) 5 % SOLN Apply to eye. Both eyes one drop BID  . Magnesium (V-R MAGNESIUM) 250 MG TABS Take 2 tablets by mouth daily.   . Multiple Vitamins-Minerals (OCUVITE-LUTEIN PO) Take by mouth.  Marland Kitchen PHOSPHATIDYLSERINE PO 100 mg Herbal Name: Phosphatidylserine  . Polyethyl Glycol-Propyl Glycol (SYSTANE ULTRA) 0.4-0.3 % SOLN Apply to eye.  . Polyethylene Glycol 3350 (MIRALAX PO) Take 17 g by mouth daily.   Marland Kitchen senna-docusate (SENOKOT-S) 8.6-50 MG tablet Take 2 tablets by mouth every evening.   . Turmeric 450 MG CAPS Take 550 mg by mouth every evening.   . vitamin B-12 (CYANOCOBALAMIN) 500 MCG tablet Take 500 mcg by mouth daily. Quick dissolve (cherry)     Review of Systems  All other systems reviewed and are otherwise negative except as  noted above.  Physical Exam    VS:  BP (!) 144/76   Pulse 62   Ht 5' 7.5" (1.715 m)   Wt 140 lb (63.5 kg)   SpO2 100%   BMI 21.60 kg/m  , BMI Body mass index is 21.6 kg/m.  Wt Readings from Last 3 Encounters:  10/14/20 140 lb (63.5 kg)  09/02/20 138 lb (62.6 kg)  07/22/20 139 lb 6.4 oz (63.2 kg)    GEN: Well nourished, well developed, in no acute distress. HEENT: normal. Neck: Supple, no JVD, carotid bruits, or masses. Cardiac: RRR, no murmurs, rubs, or gallops. No clubbing, cyanosis, edema.  Radials/DP/PT 2+ and equal bilaterally.  Respiratory:  Respirations regular and unlabored, clear to auscultation bilaterally. GI: Soft, nontender, nondistended. MS: No deformity or atrophy. Skin: Warm and dry, no rash. Neuro:  Strength and sensation are intact. Psych: Normal affect.  Assessment & Plan    1. DOE  Vonna Kotyk tightness -symptoms present for a few years and accompanied by chronic cough.  Cough improved, dyspnea persists.  PFTs last year unremarkable.  Echo 12/2019 normal LVEF, mild diastolic dysfunction, mild LVH.  ETT was intermediate risk and as such, cardiac CTA performed which showed mild nonobstructive coronary disease.  Management, as below.  She reports continued dyspnea on exertion when walking up the stairs while talking on the phone.  Anticipate some element of deconditioning is contributory.  Will discuss with Dr. Saunders Revel whether he recommends to proceed with cross-sectional imaging of the chest to exclude vascular anomaly.  2. CAD-cardiac CTA 09/30/2020 with mild calcified plaque in the proximal to mid LAD causing minimal less than 25% stenosis.  Continue aspirin 81 mg-tells me she can only tolerate twice per week.  We discussed secondary prevention including lifestyle changes and medication management.  We will plan to recheck lipid panel next week for guidance regarding cholesterol management.  Will defer beta-blocker at this time due to low normal resting heart rate.  Discussed that her shortness of breath significant coronary disease.  3. HLD, LDL goal less than 70-discussed LDL goal less than 70 in the setting of nonobstructive coronary disease.  She is very hesitant regarding cholesterol medication.  Plan for fasting lipid panel next week anticipate utilization of Crestor will likely start 3 times per week per her preference.  We discussed that lifestyle changes are best paired with a statin and this would be recommended in the setting of coronary disease as well as strong family history of coronary disease.   Disposition: Follow up in 3 month(s) with Dr. Saunders Revel or APP   Signed, Loel Dubonnet, NP 10/14/2020, 12:23 PM [place]

## 2020-10-14 NOTE — Patient Instructions (Addendum)
Medication Instructions:  None ordered today  *If you need a refill on your cardiac medications before your next appointment, please call your pharmacy*  Lab Work: Your provider recommends that you return for lab work next week at the [facility]: fasting lipid panel and CBC  Your provider recommends that you return for lab work a few days before your follow up to the [facility] for fasting lipid panel/CMP.  If you have labs (blood work) drawn today and your tests are completely normal, you will receive your results only by: Marland Kitchen MyChart Message (if you have MyChart) OR . A paper copy in the mail If you have any lab test that is abnormal or we need to change your treatment, we will call you to review the results.  Testing/Procedures: Your cardiac CTA showed mild coronary plaque with no significant blockages.   Follow-Up: At [facility], you and your health needs are our priority.  As part of our continuing mission to provide you with exceptional heart care, we have created designated Provider Care Teams.  These Care Teams include your primary Cardiologist (physician) and Advanced Practice Providers (APPs -  Physician Assistants and Nurse Practitioners) who all work together to provide you with the care you need, when you need it.  We recommend signing up for the patient portal called "MyChart".  Sign up information is provided on this After Visit Summary.  MyChart is used to connect with patients for Virtual Visits (Telemedicine).  Patients are able to view lab/test results, encounter notes, upcoming appointments, etc.  Non-urgent messages can be sent to your provider as well.   To learn more about what you can do with MyChart, go to [email or web address].    Your next appointment:   3 month(s)  The format for your next appointment:   In Person  Provider:   You may see Nelva Bush, MD or one of the following Advanced Practice Providers on your designated Care Team:     Murray Hodgkins, NP  Christell Faith, PA-C  Marrianne Mood, PA-C  Cadence Kathlen Mody, [place]  Laurann Montana, NP  Other Instructions  Rosuvastatin capsules What is this medicine? ROSUVASTATIN (roe SOO va sta tin) is known as an HMG-CoA reductase inhibitor or 'statin'. It lowers cholesterol and triglycerides in the blood. Diet and lifestyle changes are often used with this drug. This medicine may be used for other purposes; ask your health care provider or pharmacist if you have questions. COMMON BRAND NAME(S): Ezallor What should I tell my health care provider before I take this medicine? They need to know if you have any of these conditions:  diabetes  if you often drink alcohol  history of stroke  kidney disease  liver disease  muscle aches or weakness  thyroid disease  an unusual or allergic reaction to rosuvastatin, other medicines, foods, dyes, or preservatives  pregnant or trying to get pregnant  breast-feeding How should I use this medicine? Take this medicine by mouth with a glass of water. Follow the directions on the prescription label. Swallow the capsules whole. Do not crush or chew this medicine. You may open the capsule and put the contents in 1 teaspoon of applesauce, chocolate pudding, or vanilla pudding. Swallow the medicine with applesauce or pudding within 60 minutes (1 hour). Do not chew the medicine, applesauce, or pudding. You can take this medicine with or without food. Take your doses at regular intervals. Do not take your medicine more often than directed. Talk to your pediatrician about the  use of this medicine in children. Special care may be needed. Overdosage: If you think you have taken too much of this medicine contact a poison control center or emergency room at once. NOTE: This medicine is only for you. Do not share this medicine with others. What if I miss a dose? If you miss a dose, take it as soon as you can. If your next dose is to be taken  in less than 12 hours, then do not take the missed dose. Take the next dose at your regular time. Do not take double or extra doses. What may interact with this medicine? Do not take this medicine with any of the following medications:  herbal medicines like red yeast rice This medicine may also interact with the following medications:  alcohol  antacids containing aluminum hydroxide or magnesium hydroxide  cyclosporine  other medicines for high cholesterol  some medicines for HIV infection  warfarin This list may not describe all possible interactions. Give your health care provider a list of all the medicines, herbs, non-prescription drugs, or dietary supplements you use. Also tell them if you smoke, drink alcohol, or use illegal drugs. Some items may interact with your medicine. What should I watch for while using this medicine? Visit your doctor or health care professional for regular check-ups. You may need regular tests to make sure your liver is working properly. Your health care professional may tell you to stop taking this medicine if you develop muscle problems. If your muscle problems do not go away after stopping this medicine, contact your health care professional. Do not become pregnant while taking this medicine. Women should inform their health care professional if they wish to become pregnant or think they might be pregnant. There is a potential for serious side effects to an unborn child. Talk to your health care professional or pharmacist for more information. Do not breast-feed an infant while taking this medicine. This medicine may increase blood sugar. Ask your healthcare provider if changes in diet or medicines are needed if you have diabetes. If you are going to need surgery or other procedure, tell your doctor that you are using this medicine. This drug is only part of a total heart-health program. Your doctor or a dietician can suggest a low-cholesterol and low-fat  diet to help. Avoid alcohol and smoking, and keep a proper exercise schedule. This medicine may cause a decrease in Co-Enzyme Q-10. You should make sure that you get enough Co-Enzyme Q-10 while you are taking this medicine. Discuss the foods you eat and the vitamins you take with your health care professional. What side effects may I notice from receiving this medicine? Side effects that you should report to your doctor or health care professional as soon as possible:  allergic reactions like skin rash, itching or hives, swelling of the face, lips, or tongue  dark urine  fever  joint pain  muscle cramps, pain  redness, blistering, peeling or loosening of the skin, including inside the mouth  signs and symptoms of high blood sugar such as being more thirsty or hungry or having to urinate more than normal. You may also feel very tired or have blurry vision.  trouble passing urine or change in the amount of urine  unusually weak  yellowing of the eyes or skin Side effects that usually do not require medical attention (report these to your doctor or health care professional if they continue or are bothersome):  constipation  heartburn  nausea  stomach  gas, pain, upset This list may not describe all possible side effects. Call your doctor for medical advice about side effects. You may report side effects to FDA at 1-800-FDA-1088. Where should I keep my medicine? Keep out of the reach of children. Store at room temperature between 20 and 25 degrees C (68 and 77 degrees F). Keep container tightly closed (protect from moisture). Throw away any unused medicine after the expiration date. NOTE: This sheet is a summary. It may not cover all possible information. If you have questions about this medicine, talk to your doctor, pharmacist, or health care provider.  2020 Elsevier/Gold Standard (2019-03-12 10:34:28)

## 2020-10-21 ENCOUNTER — Other Ambulatory Visit: Payer: Self-pay

## 2020-10-21 ENCOUNTER — Other Ambulatory Visit
Admission: RE | Admit: 2020-10-21 | Discharge: 2020-10-21 | Disposition: A | Discharge status: Home / Self Care | Payer: Medicare Other | Attending: Family | Admitting: Family

## 2020-10-21 ENCOUNTER — Telehealth: Payer: Self-pay

## 2020-10-21 DIAGNOSIS — E785 Hyperlipidemia, unspecified: Secondary | ICD-10-CM | POA: Diagnosis not present

## 2020-10-21 DIAGNOSIS — E7800 Pure hypercholesterolemia, unspecified: Secondary | ICD-10-CM

## 2020-10-21 DIAGNOSIS — R0609 Other forms of dyspnea: Secondary | ICD-10-CM

## 2020-10-21 DIAGNOSIS — R0600 Dyspnea, unspecified: Secondary | ICD-10-CM | POA: Insufficient documentation

## 2020-10-21 LAB — COMPREHENSIVE METABOLIC PANEL
ALT: 17 U/L (ref 0–44)
AST: 22 U/L (ref 15–41)
Albumin: 4.1 g/dL (ref 3.5–5.0)
Alkaline Phosphatase: 50 U/L (ref 38–126)
Anion gap: 9 (ref 5–15)
BUN: 17 mg/dL (ref 8–23)
CO2: 28 mmol/L (ref 22–32)
Calcium: 9.4 mg/dL (ref 8.9–10.3)
Chloride: 100 mmol/L (ref 98–111)
Creatinine, Ser: 0.84 mg/dL (ref 0.44–1.00)
GFR, Estimated: >60 mL/min (ref >60)
Glucose, Bld: 105 mg/dL — ABNORMAL HIGH (ref 70–99)
Potassium: 4.3 mmol/L (ref 3.5–5.1)
Sodium: 137 mmol/L (ref 135–145)
Total Bilirubin: 0.9 mg/dL (ref 0.3–1.2)
Total Protein: 7.1 g/dL (ref 6.5–8.1)

## 2020-10-21 LAB — LIPID PANEL
Cholesterol: 234 mg/dL — ABNORMAL HIGH (ref 0–200)
HDL: 84 mg/dL (ref >40)
LDL Cholesterol: 135 mg/dL — ABNORMAL HIGH (ref 0–99)
Total CHOL/HDL Ratio: 2.8 RATIO
Triglycerides: 73 mg/dL (ref <150)
VLDL: 15 mg/dL (ref 0–40)

## 2020-10-21 LAB — CBC
HCT: 43.2 % (ref 36.0–46.0)
Hemoglobin: 14.2 g/dL (ref 12.0–15.0)
MCH: 31.1 pg (ref 26.0–34.0)
MCHC: 32.9 g/dL (ref 30.0–36.0)
MCV: 94.5 fL (ref 80.0–100.0)
Platelets: 225 10*3/uL (ref 150–400)
RBC: 4.57 MIL/uL (ref 3.87–5.11)
RDW: 12.2 % (ref 11.5–15.5)
WBC: 5.2 10*3/uL (ref 4.0–10.5)
nRBC: 0 % (ref 0.0–0.2)

## 2020-10-21 MED ORDER — ROSUVASTATIN CALCIUM 20 MG PO TABS: 20.0000 mg | ORAL_TABLET | ORAL | 2 refills | Status: DC

## 2020-10-21 NOTE — Telephone Encounter (Signed)
Was able to reach pt via mobile, advised pt of Caitly, NP recommendation of recent labs "Normal kidney/liver function and electrolytes. Cholesterol panel shows total cholesterol is above goal of 200 and LDL is above goal of 70. We discussed lipid-lowering therapy in her clinic visit. She did have some hesitation, so we agreed for a slow initiation. As such, recommend starting Rosuvastatin (Crestor) 20mg  three times per week. She will have repeat labs prior to her follow up in February and these have been ordered already.".  Pt is a hesitate on starting Crestor 20mg  three times a week, d/t her mother's reaction to "statins", however has agreed to start medication and will call the clinic for any concerns or lower dosing if need be. She will pick up this new medication at her pharmacy. Pt aware of repeat labs in Feb, order will be placed. No other concerns or questions at this time, pt verbalized understanding and okay with plan.

## 2020-10-21 NOTE — Telephone Encounter (Signed)
-----   Message from Loel Dubonnet, NP sent at 10/21/2020 10:02 AM EST ----- Normal kidney/liver function and electrolytes. Cholesterol panel shows total cholesterol is above goal of 200 and LDL is above goal of 70. We discussed lipid-lowering therapy in her clinic visit. She did have some hesitation, so we agreed for a slow initiation. As such, recommend starting Rosuvastatin (Crestor) 20mg  three times per week. She will have repeat labs prior to her follow up in February and these have been ordered already.

## 2020-11-03 NOTE — Progress Notes (Signed)
Subjective:   Barbara Gates is a 69 y.o. female who presents for Medicare Annual (Subsequent) preventive examination.  I connected with Richardson Landry today by telephone and verified that I am speaking with the correct person using two identifiers. Location patient: home Location provider: work Persons participating in the virtual visit: patient, provider.   I discussed the limitations, risks, security and privacy concerns of performing an evaluation and management service by telephone and the availability of in person appointments. I also discussed with the patient that there may be a patient responsible charge related to this service. The patient expressed understanding and verbally consented to this telephonic visit.    Interactive audio and video telecommunications were attempted between this provider and patient, however failed, due to patient having technical difficulties OR patient did not have access to video capability.  We continued and completed visit with audio only.   Review of Systems    N/A  Cardiac Risk Factors include: advanced age (>26men, >35 women);dyslipidemia     Objective:    Today's Vitals   11/04/20 1105  BP: 118/76  Pulse: (!) 52  Temp: 97.6 F (36.4 C)  TempSrc: Oral  Weight: 137 lb (62.1 kg)  Height: 5\' 8"  (1.727 m)   Body mass index is 20.83 kg/m.  Advanced Directives 11/04/2020 07/22/2020 05/27/2020 10/29/2019 10/24/2018 09/19/2018 08/22/2018  Does Patient Have a Medical Advance Directive? Yes Yes No Yes Yes Yes Yes  Type of Paramedic of [place];Living will [place];Living will - [place];Living will [place];Living will [place];Living will [place];Living will  Does patient want to make changes to medical advance directive? - No - Patient declined - - - No - Patient declined No - Patient declined  Copy of [place] in Chart? Yes - validated most recent copy scanned in chart (See row information) No - copy requested - Yes - validated most recent copy scanned in chart (See row information) Yes - validated most recent copy scanned in chart (See row information) Yes Yes  Would patient like information on creating a medical advance directive? - - No - Patient declined - - - -    Current Medications (verified) Outpatient Encounter Medications as of 11/04/2020  Medication Sig  . Alendronate Sodium 70 MG TBEF Take 1 tablet by mouth once a week.  Marland Kitchen aspirin 81 MG EC tablet Take 81 mg by mouth 2 (two) times a week. Swallow whole.  . carboxymethylcellulose (REFRESH PLUS) 0.5 % SOLN Place 1 drop into both eyes 3 (three) times daily as needed. Six times daily as needed  . Cholecalciferol (VITAMIN D3) 50 MCG (2000 UT) capsule Take 2,000 Units by mouth daily.  Javier Docker Oil 500 MG CAPS Take by mouth. Two caps 500 mg each daily  . Magnesium 250 MG TABS Take 2 tablets by mouth daily.   . melatonin 1 MG TABS tablet Take 1 mg by mouth at bedtime as needed.  . Multiple Vitamins-Minerals (OCUVITE-LUTEIN PO) Take by mouth.  . NON FORMULARY Take 3 capsules by mouth daily at 6 (six) AM. Jarro's Bone Up  . PHOSPHATIDYLSERINE PO 100 mg Herbal Name: Phosphatidylserine  . Polyethylene Glycol 3350 (MIRALAX PO) Take 17 g by mouth daily.   . rosuvastatin (CRESTOR) 20 MG tablet Take 1 tablet (20 mg total) by mouth 3 (three) times a week.  . senna-docusate (SENOKOT-S) 8.6-50 MG tablet Take 2 tablets by mouth  every evening.   . Turmeric 450 MG CAPS Take 550 mg by mouth every evening.   . vitamin B-12 (CYANOCOBALAMIN) 500 MCG tablet Take 500 mcg by mouth daily. Quick dissolve (cherry)  . Lifitegrast (XIIDRA) 5 % SOLN Apply to eye. Both eyes one drop BID (Patient not taking: Reported on 11/04/2020)  . Polyethyl Glycol-Propyl Glycol (SYSTANE ULTRA) 0.4-0.3 % SOLN Apply to eye. (Patient not taking: Reported on 11/04/2020)    No facility-administered encounter medications on file as of 11/04/2020.    Allergies (verified) Other, Raloxifene hcl, Systane [polyethyl glycol-propyl glycol], Tamoxifen, Xiidra [lifitegrast], and Mango flavor   History: Past Medical History:  Diagnosis Date  . Dry eye   . Dyspnea    with exertion  . Endometriosis   . 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 Fransisca Connors  . Hypercalcemia   . Hyperlipidemia    Has history of this  . Motion sickness   . Nodule of vagina   . Osteoporosis   . Osteoporosis   . Redundant colon   . SOB (shortness of breath)    Past Surgical History:  Procedure Laterality Date  . ABDOMINAL HYSTERECTOMY  1994   Total including ovarie. Due to endometriosis  . APPENDECTOMY     with hysterectomy   . BREAST BIOPSY Left 11/08/2012   Dr. Bary Castilla, neg  . CATARACT EXTRACTION W/PHACO Right 05/27/2020   Procedure: CATARACT EXTRACTION PHACO AND INTRAOCULAR LENS PLACEMENT ([place]) RIGHT PANOPTIC LENS;  Surgeon: Leandrew Koyanagi, MD;  Location: [facility];  Service: Ophthalmology;  Laterality: Right;  2.14 0:43.8 4.8%  . CATARACT EXTRACTION W/PHACO Left 07/22/2020   Procedure: CATARACT EXTRACTION PHACO AND INTRAOCULAR LENS PLACEMENT ([facility]) LEFT PANOPTIX LENS 5.31 00:49.1 10.8%;  Surgeon: Leandrew Koyanagi, MD;  Location: [place];  Service: Ophthalmology;  Laterality: Left;  . granular cell tumor removed    . OTHER SURGICAL HISTORY    . Surgical Lipoma Removal; 12/13/2012  12/13/2012  . VULVAR LESION REMOVAL Left 08/22/2018   Procedure: Excision left vulvar mass, possible sentinel node mapping with blue dye, possible left inguinal lymph node biopsy or dissection;  Surgeon: Mellody Drown, MD;  Location: [facility];  Service: Gynecology;  Laterality: Left;   Family History  Problem Relation Age of Onset  . Cancer Mother        lung, pancreas, melanoma, carcinoid tumors  . Hypertension Mother   . Hyperlipidemia Mother   . Diabetes  Mother   . Hypertension Father   . Diabetes Father   . Hyperlipidemia Father   . Parkinson's disease Father   . CAD Father   . Vascular Disease Father        "hardening of the arteries"  . Diabetes Brother   . Hypertension Brother   . Breast cancer Sister 70  . Arthritis Sister   . Hypertension Sister   . Dupuytren's contracture Sister    Social History   Socioeconomic History  . Marital status: Married    Spouse name: Not on file  . Number of children: 0  . Years of education: Not on file  . Highest education level: Bachelor's degree (e.g., BA, AB, BS)  Occupational History  . Occupation: retired  Tobacco Use  . Smoking status: Never Smoker  . Smokeless tobacco: Never Used  Vaping Use  . Vaping Use: Never used  Substance and Sexual Activity  . Alcohol use: Yes    Alcohol/week: 2.0 standard drinks    Types: 1 Glasses of wine, 1 Standard drinks or  equivalent per week    Comment: 1/2 margarita on Sat  . Drug use: No  . Sexual activity: Not Currently    Birth control/protection: Surgical    Comment: Hysterecetomy  Other Topics Concern  . Not on file  Social History Narrative  . Not on file   Social Determinants of Health   Financial Resource Strain: Low Risk   . Difficulty of Paying Living Expenses: Not hard at all  Food Insecurity: No Food Insecurity  . Worried About Charity fundraiser in the Last Year: Never true  . Ran Out of Food in the Last Year: Never true  Transportation Needs: No Transportation Needs  . Lack of Transportation (Medical): No  . Lack of Transportation (Non-Medical): No  Physical Activity: Sufficiently Active  . Days of Exercise per Week: 5 days  . Minutes of Exercise per Session: 40 min  Stress: No Stress Concern Present  . Feeling of Stress : Not at all  Social Connections: Moderately Integrated  . Frequency of Communication with Friends and Family: More than three times a week  . Frequency of Social Gatherings with Friends and Family:  More than three times a week  . Attends Religious Services: Never  . Active Member of Clubs or Organizations: Yes  . Attends Archivist Meetings: More than 4 times per year  . Marital Status: Married    Tobacco Counseling Counseling given: Not Answered   Clinical Intake:  Pre-visit preparation completed: Yes  Pain : No/denies pain     Nutritional Risks: None Diabetes: No  How often do you need to have someone help you when you read instructions, pamphlets, or other written materials from your doctor or pharmacy?: 1 - Never  Diabetic? No  Interpreter Needed?: No  Information entered by :: [facility], LPN   Activities of Daily Living In your present state of health, do you have any difficulty performing the following activities: 11/04/2020 07/22/2020  Hearing? N N  Vision? Y N  Comment Due to dry eye issues. -  Difficulty concentrating or making decisions? N N  Walking or climbing stairs? N N  Dressing or bathing? N N  Doing errands, shopping? N -  Preparing Food and eating ? N -  Using the Toilet? N -  In the past six months, have you accidently leaked urine? Y -  Comment slight urine incontinence -  Do you have problems with loss of bowel control? N -  Managing your Medications? N -  Managing your Finances? N -  Housekeeping or managing your Housekeeping? N -  Some recent data might be hidden    Patient Care Team: Mar Daring, PA-C as PCP - General (Family Medicine) End, Harrell Gave, MD as PCP - Cardiology (Cardiology) Brendolyn Patty, MD as Consulting Physician (Dermatology) Tyler Pita, MD as Consulting Physician (Pulmonary Disease) Clyde Canterbury, MD as Referring Physician (Otolaryngology) Leandrew Koyanagi, MD as Referring Physician (Ophthalmology) Dingeldein, Remo Lipps, MD (Ophthalmology)  Indicate any recent Medical Services you may have received from other than [facility] providers in the past year (date may be approximate).      Assessment:   This is a routine wellness examination for [place].  Hearing/Vision screen No exam data present  Dietary issues and exercise activities discussed: Current Exercise Habits: Structured exercise class, Type of exercise: walking;strength training/weights;stretching, Time (Minutes): 45, Frequency (Times/Week): 5, Weekly Exercise (Minutes/Week): 225, Intensity: Mild, Exercise limited by: None identified  Goals   None    Depression Screen PHQ 2/9 Scores  11/04/2020 10/29/2019 10/24/2018 10/18/2017 10/05/2016  PHQ - 2 Score 0 0 0 0 0    Fall Risk Fall Risk  11/04/2020 10/29/2019 10/15/2019 10/24/2018 10/18/2017  Falls in the past year? 0 0 0 0 No  Comment - - Emmi Telephone Survey: data to providers prior to load - -  Number falls in past yr: 0 0 - - -  Injury with Fall? 0 0 - - -    FALL RISK PREVENTION PERTAINING TO THE HOME:  Any stairs in or around the home? Yes  If so, are there any without handrails? No  Home free of loose throw rugs in walkways, pet beds, electrical cords, etc? Yes  Adequate lighting in your home to reduce risk of falls? Yes   ASSISTIVE DEVICES UTILIZED TO PREVENT FALLS:  Life alert? No  Use of a cane, walker or w/c? No  Grab bars in the bathroom? No  Shower chair or bench in shower? No  Elevated toilet seat or a handicapped toilet? Yes    Cognitive Function: Normal cognitive status assessed by observation by this Nurse Health Advisor. No abnormalities found.       6CIT Screen 10/18/2017 10/05/2016  What Year? 0 points 0 points  What month? 0 points 0 points  What time? 0 points 0 points  Count back from 20 0 points 0 points  Months in reverse 0 points 0 points  Repeat phrase 0 points 0 points  Total Score 0 0    Immunizations Immunization History  Administered Date(s) Administered  . Hepatitis A, Adult 09/20/2017, 04/05/2018  . Influenza Split 09/06/2011, 09/11/2012  . Influenza, High Dose Seasonal PF 10/17/2016, 09/03/2018  .  Influenza,inj,Quad PF,6+ Mos 09/13/2013, 12/07/2015, 08/23/2017  . Influenza-Unspecified 08/26/2020  . PFIZER SARS-COV-2 Vaccination 01/03/2020, 01/28/2020, 08/26/2020  . Pneumococcal Conjugate-13 10/17/2016  . Pneumococcal Polysaccharide-23 10/18/2017  . Tdap 09/02/2010  . Typhoid Inactivated 09/20/2017  . Zoster 02/05/2014  . Zoster Recombinat (Shingrix) 10/23/2018, 03/20/2019    TDAP status: Due, Education has been provided regarding the importance of this vaccine. Advised may receive this vaccine at local pharmacy or Health Dept. Aware to provide a copy of the vaccination record if obtained from local pharmacy or Health Dept. Verbalized acceptance and understanding.  Flu Vaccine status: Up to date  Pneumococcal vaccine status: Up to date  Covid-19 vaccine status: Completed vaccines  Qualifies for Shingles Vaccine? Yes   Zostavax completed Yes   Shingrix Completed?: Yes  Screening Tests Health Maintenance  Topic Date Due  . TETANUS/TDAP  09/02/2020  . COVID-19 Vaccine (4 - Booster for Pfizer series) 02/24/2021  . DEXA SCAN  01/23/2022  . MAMMOGRAM  03/17/2022  . COLONOSCOPY  03/21/2024  . INFLUENZA VACCINE  Completed  . Hepatitis C Screening  Completed  . PNA vac Low Risk Adult  Completed    Health Maintenance  Health Maintenance Due  Topic Date Due  . TETANUS/TDAP  09/02/2020    Colorectal cancer screening: Type of screening: Colonoscopy. Completed 03/21/14. Repeat every 10 years  Mammogram status: Completed 03/17/20. Repeat every year  Bone Density status: Completed 01/24/20. Results reflect: Bone density results: OSTEOPENIA. Repeat every 2 years.  Lung Cancer Screening: (Low Dose CT Chest recommended if Age 68-80 years, 30 pack-year currently smoking OR have quit w/in 15years.) does not qualify.   Additional Screening:  Hepatitis C Screening: Up to date  Vision Screening: Recommended annual ophthalmology exams for early detection of glaucoma and other disorders  of the eye. Is the patient up  to date with their annual eye exam?  Yes  Who is the provider or what is the name of the office in which the patient attends annual eye exams? Dr Wallace Going @ [place] If pt is not established with a provider, would they like to be referred to a provider to establish care? No .   Dental Screening: Recommended annual dental exams for proper oral hygiene  Community Resource Referral / Chronic Care Management: CRR required this visit?  No   CCM required this visit?  No      Plan:     I have personally reviewed and noted the following in the patient's chart:   . Medical and social history . Use of alcohol, tobacco or illicit drugs  . Current medications and supplements . Functional ability and status . Nutritional status . Physical activity . Advanced directives . List of other physicians . Hospitalizations, surgeries, and ER visits in previous 12 months . Vitals . Screenings to include cognitive, depression, and falls . Referrals and appointments  In addition, I have reviewed and discussed with patient certain preventive protocols, quality metrics, and best practice recommendations. A written personalized care plan for preventive services as well as general preventive health recommendations were provided to patient.     Silas Sedam Miller, [place]   [id number]   Nurse Notes: None.

## 2020-11-04 ENCOUNTER — Other Ambulatory Visit: Payer: Self-pay

## 2020-11-04 ENCOUNTER — Ambulatory Visit (INDEPENDENT_AMBULATORY_CARE_PROVIDER_SITE_OTHER): Payer: Medicare Other

## 2020-11-04 VITALS — BP 118/76 | HR 52 | Temp 97.6°F | Ht 68.0 in | Wt 137.0 lb

## 2020-11-04 DIAGNOSIS — Z0000 Encounter for general adult medical examination without abnormal findings: Secondary | ICD-10-CM

## 2020-11-04 NOTE — Patient Instructions (Signed)
Barbara Gates , Thank you for taking time to come for your Medicare Wellness Visit. I appreciate your ongoing commitment to your health goals. Please review the following plan we discussed and let me know if I can assist you in the future.   Screening recommendations/referrals: Colonoscopy: Up to date, due 03/2024 Recommended yearly ophthalmology/optometry visit for glaucoma screening and checkup Recommended yearly dental visit for hygiene and checkup  Vaccinations: Influenza vaccine: Done 08/26/20 Pneumococcal vaccine: Completed series Tdap vaccine: Currently due, declined receiving.  Shingles vaccine: Completed series    Advanced directives: Currently on file.  Conditions/risks identified: None.  Next appointment: 11/10/21 @ 10:20 AM for an AWV. Declined scheduling a follow up with PCP at this time.   Preventive Care 66 Years and Older, Female Preventive care refers to lifestyle choices and visits with your health care provider that can promote health and wellness. What does preventive care include?  A yearly physical exam. This is also called an annual well check.  Dental exams once or twice a year.  Routine eye exams. Ask your health care provider how often you should have your eyes checked.  Personal lifestyle choices, including:  Daily care of your teeth and gums.  Regular physical activity.  Eating a healthy diet.  Avoiding tobacco and drug use.  Limiting alcohol use.  Practicing safe sex.  Taking low doses of aspirin every day.  Taking vitamin and mineral supplements as recommended by your health care provider. What happens during an annual well check? The services and screenings done by your health care provider during your annual well check will depend on your age, overall health, lifestyle risk factors, and family history of disease. Counseling  Your health care provider may ask you questions about your:  Alcohol use.  Tobacco use.  Drug use.  Emotional  well-being.  Home and relationship well-being.  Sexual activity.  Eating habits.  History of falls.  Memory and ability to understand (cognition).  Work and work Statistician. Screening  You may have the following tests or measurements:  Height, weight, and BMI.  Blood pressure.  Lipid and cholesterol levels. These may be checked every 5 years, or more frequently if you are over 15 years old.  Skin check.  Lung cancer screening. You may have this screening every year starting at age 21 if you have a 30-pack-year history of smoking and currently smoke or have quit within the past 15 years.  Fecal occult blood test (FOBT) of the stool. You may have this test every year starting at age 67.  Flexible sigmoidoscopy or colonoscopy. You may have a sigmoidoscopy every 5 years or a colonoscopy every 10 years starting at age 48.  Prostate cancer screening. Recommendations will vary depending on your family history and other risks.  Hepatitis C blood test.  Hepatitis B blood test.  Sexually transmitted disease (STD) testing.  Diabetes screening. This is done by checking your blood sugar (glucose) after you have not eaten for a while (fasting). You may have this done every 1-3 years.  Abdominal aortic aneurysm (AAA) screening. You may need this if you are a current or former smoker.  Osteoporosis. You may be screened starting at age 38 if you are at high risk. Talk with your health care provider about your test results, treatment options, and if necessary, the need for more tests. Vaccines  Your health care provider may recommend certain vaccines, such as:  Influenza vaccine. This is recommended every year.  Tetanus, diphtheria, and acellular pertussis (  Tdap, Td) vaccine. You may need a Td booster every 10 years.  Zoster vaccine. You may need this after age 68.  Pneumococcal 13-valent conjugate (PCV13) vaccine. One dose is recommended after age 13.  Pneumococcal  polysaccharide (PPSV23) vaccine. One dose is recommended after age 44. Talk to your health care provider about which screenings and vaccines you need and how often you need them. This information is not intended to replace advice given to you by your health care provider. Make sure you discuss any questions you have with your health care provider. Document Released: 12/04/2015 Document Revised: 07/27/2016 Document Reviewed: 09/08/2015 Elsevier Interactive Patient Education  2017 [employer] Prevention in the Home Falls can cause injuries. They can happen to people of all ages. There are many things you can do to make your home safe and to help prevent falls. What can I do on the outside of my home?  Regularly fix the edges of walkways and driveways and fix any cracks.  Remove anything that might make you trip as you walk through a door, such as a raised step or threshold.  Trim any bushes or trees on the path to your home.  Use bright outdoor lighting.  Clear any walking paths of anything that might make someone trip, such as rocks or tools.  Regularly check to see if handrails are loose or broken. Make sure that both sides of any steps have handrails.  Any raised decks and porches should have guardrails on the edges.  Have any leaves, snow, or ice cleared regularly.  Use sand or salt on walking paths during winter.  Clean up any spills in your garage right away. This includes oil or grease spills. What can I do in the bathroom?  Use night lights.  Install grab bars by the toilet and in the tub and shower. Do not use towel bars as grab bars.  Use non-skid mats or decals in the tub or shower.  If you need to sit down in the shower, use a plastic, non-slip stool.  Keep the floor dry. Clean up any water that spills on the floor as soon as it happens.  Remove soap buildup in the tub or shower regularly.  Attach bath mats securely with double-sided non-slip rug  tape.  Do not have throw rugs and other things on the floor that can make you trip. What can I do in the bedroom?  Use night lights.  Make sure that you have a light by your bed that is easy to reach.  Do not use any sheets or blankets that are too big for your bed. They should not hang down onto the floor.  Have a firm chair that has side arms. You can use this for support while you get dressed.  Do not have throw rugs and other things on the floor that can make you trip. What can I do in the kitchen?  Clean up any spills right away.  Avoid walking on wet floors.  Keep items that you use a lot in easy-to-reach places.  If you need to reach something above you, use a strong step stool that has a grab bar.  Keep electrical cords out of the way.  Do not use floor polish or wax that makes floors slippery. If you must use wax, use non-skid floor wax.  Do not have throw rugs and other things on the floor that can make you trip. What can I do with my stairs?  Do  not leave any items on the stairs.  Make sure that there are handrails on both sides of the stairs and use them. Fix handrails that are broken or loose. Make sure that handrails are as long as the stairways.  Check any carpeting to make sure that it is firmly attached to the stairs. Fix any carpet that is loose or worn.  Avoid having throw rugs at the top or bottom of the stairs. If you do have throw rugs, attach them to the floor with carpet tape.  Make sure that you have a light switch at the top of the stairs and the bottom of the stairs. If you do not have them, ask someone to add them for you. What else can I do to help prevent falls?  Wear shoes that:  Do not have high heels.  Have rubber bottoms.  Are comfortable and fit you well.  Are closed at the toe. Do not wear sandals.  If you use a stepladder:  Make sure that it is fully opened. Do not climb a closed stepladder.  Make sure that both sides of the  stepladder are locked into place.  Ask someone to hold it for you, if possible.  Clearly mark and make sure that you can see:  Any grab bars or handrails.  First and last steps.  Where the edge of each step is.  Use tools that help you move around (mobility aids) if they are needed. These include:  Canes.  Walkers.  Scooters.  Crutches.  Turn on the lights when you go into a dark area. Replace any light bulbs as soon as they burn out.  Set up your furniture so you have a clear path. Avoid moving your furniture around.  If any of your floors are uneven, fix them.  If there are any pets around you, be aware of where they are.  Review your medicines with your doctor. Some medicines can make you feel dizzy. This can increase your chance of falling. Ask your doctor what other things that you can do to help prevent falls. This information is not intended to replace advice given to you by your health care provider. Make sure you discuss any questions you have with your health care provider. Document Released: 09/03/2009 Document Revised: 04/14/2016 Document Reviewed: 12/12/2014 Elsevier Interactive Patient Education  2017 [employer].

## 2020-11-10 DIAGNOSIS — H16223 Keratoconjunctivitis sicca, not specified as Sjogren's, bilateral: Secondary | ICD-10-CM | POA: Diagnosis not present

## 2020-11-30 DIAGNOSIS — H16223 Keratoconjunctivitis sicca, not specified as Sjogren's, bilateral: Secondary | ICD-10-CM | POA: Diagnosis not present

## 2020-12-04 DIAGNOSIS — H04123 Dry eye syndrome of bilateral lacrimal glands: Secondary | ICD-10-CM | POA: Diagnosis not present

## 2020-12-21 DIAGNOSIS — H16223 Keratoconjunctivitis sicca, not specified as Sjogren's, bilateral: Secondary | ICD-10-CM | POA: Diagnosis not present

## 2020-12-22 ENCOUNTER — Encounter: Payer: Self-pay | Admitting: Physician Assistant

## 2020-12-22 ENCOUNTER — Other Ambulatory Visit: Payer: Self-pay

## 2020-12-22 ENCOUNTER — Ambulatory Visit (INDEPENDENT_AMBULATORY_CARE_PROVIDER_SITE_OTHER): Payer: Medicare Other | Admitting: Physician Assistant

## 2020-12-22 VITALS — BP 136/62 | HR 62 | Temp 98.3°F | Wt 143.0 lb

## 2020-12-22 DIAGNOSIS — R2681 Unsteadiness on feet: Secondary | ICD-10-CM | POA: Diagnosis not present

## 2020-12-22 DIAGNOSIS — R296 Repeated falls: Secondary | ICD-10-CM

## 2020-12-22 NOTE — Progress Notes (Signed)
Established patient visit   Patient: Barbara Gates   DOB: 03/31/1951   70 y.o. Female  MRN: [id number] Visit Date: 12/22/2020  Today's healthcare provider: Mar Daring, PA-C   Chief Complaint  Patient presents with  . Fall    Pt reports having several falls in the last month.  Gait has been off.    Subjective    HPI HPI    Fall     Additional comments: Pt reports having several falls in the last month.  Gait has been off.        Last edited by Kizzie Furnish, CMA on 12/22/2020  2:25 PM. (History)      Patient reports having increased falls, gait instability. Feels like she is tripping over her own feet. Some "off balance" sensations when she goes from sitting to standing. Reports she has to hold on to something when she stands as she feels off and like she may fall again. She denies any lightheadedness or vertigo. Denies tremors. Denies significant memory loss but does report some mild memory loss. Does have some associated vision changes but recently had cataract surgery. Ophthalmologist states eyes have healed well and should not be contributing.   Patient Active Problem List   Diagnosis Date Noted  . Chest tightness 09/03/2020  . Vulvar lesion 07/31/2018  . Nodule of tendon sheath 07/19/2018  . Chronic pain of both shoulders 04/05/2018  . DOE (dyspnea on exertion) 06/01/2016  . Memory impairment of gradual onset 12/30/2015  . Adult idiopathic generalized osteoporosis 06/11/2015  . Colon polyp 06/11/2015  . Closed fracture of proximal end of radius 06/11/2015  . Auditory impairment 06/11/2015  . Hypercholesteremia 06/11/2015  . Gonalgia 06/11/2015  . Herpes zona 06/11/2015  . Female genuine stress incontinence 06/11/2015  . Chronic constipation 09/02/2014  . Lipoma of back-left 07/18/2012   Past Medical History:  Diagnosis Date  . Dry eye   . Dyspnea    with exertion  . Endometriosis   . 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 Fransisca Connors  .  Hypercalcemia   . Hyperlipidemia    Has history of this  . Motion sickness   . Nodule of vagina   . Osteoporosis   . Osteoporosis   . Redundant colon   . SOB (shortness of breath)    Social History   Tobacco Use  . Smoking status: Never Smoker  . Smokeless tobacco: Never Used  Vaping Use  . Vaping Use: Never used  Substance Use Topics  . Alcohol use: Yes    Alcohol/week: 2.0 standard drinks    Types: 1 Glasses of wine, 1 Standard drinks or equivalent per week    Comment: 1/2 margarita on Sat  . Drug use: No   Allergies  Allergen Reactions  . Other Hives    selamectin active ingredient in Revolution Plus for cats.  . Raloxifene Hcl Cough       . Systane [Polyethyl Glycol-Propyl Glycol] Itching  . Tamoxifen Cough  . Xiidra [Lifitegrast] Itching  . Mango Flavor Itching and Rash     Medications: Outpatient Medications Prior to Visit  Medication Sig  . Alendronate Sodium 70 MG TBEF Take 1 tablet by mouth once a week.  Marland Kitchen aspirin 81 MG EC tablet Take 81 mg by mouth 2 (two) times a week. Swallow whole.  . carboxymethylcellulose (REFRESH PLUS) 0.5 % SOLN Place 1 drop into both eyes 3 (three) times daily as needed. Six times daily as needed  .  Cholecalciferol (VITAMIN D3) 50 MCG (2000 UT) capsule Take 2,000 Units by mouth daily.  . cycloSPORINE (RESTASIS) 0.05 % ophthalmic emulsion 1 drop 2 (two) times daily.  . Magnesium 250 MG TABS Take 2 tablets by mouth daily.   . melatonin 1 MG TABS tablet Take 1 mg by mouth at bedtime as needed.  . Multiple Vitamins-Minerals (OCUVITE-LUTEIN PO) Take by mouth.  . NON FORMULARY Take 3 capsules by mouth daily at 6 (six) AM. Jarro's Bone Up  . PHOSPHATIDYLSERINE PO 100 mg Herbal Name: Phosphatidylserine  . Polyethylene Glycol 3350 (MIRALAX PO) Take 17 g by mouth daily.   . rosuvastatin (CRESTOR) 20 MG tablet Take 1 tablet (20 mg total) by mouth 3 (three) times a week.  . senna-docusate (SENOKOT-S) 8.6-50 MG tablet Take 2 tablets by mouth  every evening.   . Turmeric 450 MG CAPS Take 550 mg by mouth every evening.   . Varenicline Tartrate (TYRVAYA) 0.03 MG/ACT SOLN Place into the nose.  . vitamin B-12 (CYANOCOBALAMIN) 500 MCG tablet Take 500 mcg by mouth daily. Quick dissolve (cherry)  . Krill Oil 500 MG CAPS Take by mouth. Two caps 500 mg each daily (Patient not taking: Reported on 12/22/2020)  . Lifitegrast (XIIDRA) 5 % SOLN Apply to eye. Both eyes one drop BID (Patient not taking: Reported on 11/04/2020)  . Polyethyl Glycol-Propyl Glycol (SYSTANE ULTRA) 0.4-0.3 % SOLN Apply to eye. (Patient not taking: No sig reported)   No facility-administered medications prior to visit.    Review of Systems  Constitutional: Negative.   Respiratory: Positive for shortness of breath. Negative for apnea, cough, choking, chest tightness, wheezing and stridor.   Cardiovascular: Negative.   Gastrointestinal: Negative.   Musculoskeletal: Positive for gait problem. Negative for arthralgias, back pain, joint swelling, myalgias, neck pain and neck stiffness.  Neurological: Positive for weakness. Negative for dizziness, tremors, seizures, syncope, light-headedness, numbness and headaches.       Frequent falls and off balance      Objective    BP 136/62 (BP Location: Left Arm, Patient Position: Sitting, Cuff Size: Large)   Pulse 62   Temp 98.3 F (36.8 C) (Oral)   Wt 143 lb (64.9 kg)   BMI 21.74 kg/m    Physical Exam Vitals reviewed.  Constitutional:      General: She is not in acute distress.    Appearance: Normal appearance. She is well-developed, normal weight and well-nourished. She is not ill-appearing or diaphoretic.  Eyes:     General: No visual field deficit. Neck:     Thyroid: No thyromegaly.     Vascular: No carotid bruit or JVD.     Trachea: No tracheal deviation.  Cardiovascular:     Rate and Rhythm: Normal rate and regular rhythm.     Pulses: Normal pulses.     Heart sounds: Normal heart sounds. No murmur heard. No  friction rub. No gallop.   Pulmonary:     Effort: Pulmonary effort is normal. No respiratory distress.     Breath sounds: Normal breath sounds. No wheezing or rales.  Musculoskeletal:     Cervical back: Normal range of motion and neck supple. No tenderness.  Lymphadenopathy:     Cervical: No cervical adenopathy.  Neurological:     General: No focal deficit present.     Mental Status: She is alert and oriented to person, place, and time. Mental status is at baseline.     Cranial Nerves: Cranial nerves are intact. No cranial nerve deficit or facial  asymmetry.     Sensory: Sensation is intact.     Motor: Motor function is intact. No weakness, tremor or pronator drift.     Coordination: Coordination is intact. Romberg sign negative. Coordination normal. Finger-Nose-Finger Test and Heel to [facility] Test normal.     Gait: Gait is intact. Gait normal.  Psychiatric:        Mood and Affect: Mood normal.        Thought Content: Thought content normal.      No results found for any visits on 12/22/20.  Assessment & Plan     1. Gait instability DDx: overall generalized weakness, mass/tumor, MS, Parkinsonism. Will refer to Neurology for further evaluation. Consult appreciated. - Ambulatory referral to Neurology  2. Frequent falls See above medical treatment plan. - Ambulatory referral to Neurology   No follow-ups on file.      Reynolds Bowl, PA-C, have reviewed all documentation for this visit. The documentation on 12/23/20 for the exam, diagnosis, procedures, and orders are all accurate and complete.   Rubye Beach  [facility] [phone] (phone) [phone] (fax)  [place]

## 2020-12-23 ENCOUNTER — Encounter: Payer: Self-pay | Admitting: Physician Assistant

## 2020-12-23 NOTE — Patient Instructions (Signed)
Fall Prevention in the Home, Adult Falls can cause injuries and can happen to people of all ages. There are many things you can do to make your home safe and to help prevent falls. Ask for help when making these changes. What actions can I take to prevent falls? General Instructions  Use good lighting in all rooms. Replace any light bulbs that burn out.  Turn on the lights in dark areas. Use night-lights.  Keep items that you use often in easy-to-reach places. Lower the shelves around your home if needed.  Set up your furniture so you have a clear path. Avoid moving your furniture around.  Do not have throw rugs or other things on the floor that can make you trip.  Avoid walking on wet floors.  If any of your floors are uneven, fix them.  Add color or contrast paint or tape to clearly mark and help you see: ? Grab bars or handrails. ? First and last steps of staircases. ? Where the edge of each step is.  If you use a stepladder: ? Make sure that it is fully opened. Do not climb a closed stepladder. ? Make sure the sides of the stepladder are locked in place. ? Ask someone to hold the stepladder while you use it.  Know where your pets are when moving through your home. What can I do in the bathroom?  Keep the floor dry. Clean up any water on the floor right away.  Remove soap buildup in the tub or shower.  Use nonskid mats or decals on the floor of the tub or shower.  Attach bath mats securely with double-sided, nonslip rug tape.  If you need to sit down in the shower, use a plastic, nonslip stool.  Install grab bars by the toilet and in the tub and shower. Do not use towel bars as grab bars.      What can I do in the bedroom?  Make sure that you have a light by your bed that is easy to reach.  Do not use any sheets or blankets for your bed that hang to the floor.  Have a firm chair with side arms that you can use for support when you get dressed. What can I do in  the kitchen?  Clean up any spills right away.  If you need to reach something above you, use a step stool with a grab bar.  Keep electrical cords out of the way.  Do not use floor polish or wax that makes floors slippery. What can I do with my stairs?  Do not leave any items on the stairs.  Make sure that you have a light switch at the top and the bottom of the stairs.  Make sure that there are handrails on both sides of the stairs. Fix handrails that are broken or loose.  Install nonslip stair treads on all your stairs.  Avoid having throw rugs at the top or bottom of the stairs.  Choose a carpet that does not hide the edge of the steps on the stairs.  Check carpeting to make sure that it is firmly attached to the stairs. Fix carpet that is loose or worn. What can I do on the outside of my home?  Use bright outdoor lighting.  Fix the edges of walkways and driveways and fix any cracks.  Remove anything that might make you trip as you walk through a door, such as a raised step or threshold.  Trim any   bushes or trees on paths to your home.  Check to see if handrails are loose or broken and that both sides of all steps have handrails.  Install guardrails along the edges of any raised decks and porches.  Clear paths of anything that can make you trip, such as tools or rocks.  Have leaves, snow, or ice cleared regularly.  Use sand or salt on paths during winter.  Clean up any spills in your garage right away. This includes grease or oil spills. What other actions can I take?  Wear shoes that: ? Have a low heel. Do not wear high heels. ? Have rubber bottoms. ? Feel good on your feet and fit well. ? Are closed at the toe. Do not wear open-toe sandals.  Use tools that help you move around if needed. These include: ? Canes. ? Walkers. ? Scooters. ? Crutches.  Review your medicines with your doctor. Some medicines can make you feel dizzy. This can increase your chance  of falling. Ask your doctor what else you can do to help prevent falls. Where to find more information  Centers for Disease Control and Prevention, STEAD[employer]: www.cdc.gov  National Institute on Aging: www.nia.nih.gov Contact a doctor if:  You are afraid of falling at home.  You feel weak, drowsy, or dizzy at home.  You fall at home. Summary  There are many simple things that you can do to make your home safe and to help prevent falls.  Ways to make your home safe include removing things that can make you trip and installing grab bars in the bathroom.  Ask for help when making these changes in your home. This information is not intended to replace advice given to you by your health care provider. Make sure you discuss any questions you have with your health care provider. Document Revised: 06/10/2020 Document Reviewed: 06/10/2020 Elsevier Patient Education  2021 Elsevier Inc.  

## 2021-01-01 DIAGNOSIS — R259 Unspecified abnormal involuntary movements: Secondary | ICD-10-CM | POA: Diagnosis not present

## 2021-01-01 DIAGNOSIS — E538 Deficiency of other specified B group vitamins: Secondary | ICD-10-CM | POA: Diagnosis not present

## 2021-01-01 DIAGNOSIS — E559 Vitamin D deficiency, unspecified: Secondary | ICD-10-CM | POA: Diagnosis not present

## 2021-01-01 DIAGNOSIS — R2689 Other abnormalities of gait and mobility: Secondary | ICD-10-CM | POA: Diagnosis not present

## 2021-01-01 DIAGNOSIS — Z79899 Other long term (current) drug therapy: Secondary | ICD-10-CM | POA: Diagnosis not present

## 2021-01-01 DIAGNOSIS — R0602 Shortness of breath: Secondary | ICD-10-CM | POA: Diagnosis not present

## 2021-01-04 ENCOUNTER — Other Ambulatory Visit (HOSPITAL_COMMUNITY): Payer: Self-pay | Admitting: Neurology

## 2021-01-04 ENCOUNTER — Telehealth: Payer: Self-pay | Admitting: Internal Medicine

## 2021-01-04 ENCOUNTER — Other Ambulatory Visit: Payer: Self-pay | Admitting: Neurology

## 2021-01-04 DIAGNOSIS — E785 Hyperlipidemia, unspecified: Secondary | ICD-10-CM

## 2021-01-04 DIAGNOSIS — I25118 Atherosclerotic heart disease of native coronary artery with other forms of angina pectoris: Secondary | ICD-10-CM

## 2021-01-04 DIAGNOSIS — R2689 Other abnormalities of gait and mobility: Secondary | ICD-10-CM

## 2021-01-04 NOTE — Telephone Encounter (Signed)
CMP order entered. THanks!

## 2021-01-04 NOTE — Telephone Encounter (Signed)
Patient called to confirm need for labs prior to upcoming visit.  Order in for lipid but no order for cmp avs notes .    Your provider recommends that you return for lab work a few days before your follow up to the [place] for fasting lipid panel/CMP.    [facility] .

## 2021-01-05 ENCOUNTER — Other Ambulatory Visit
Admission: RE | Admit: 2021-01-05 | Discharge: 2021-01-05 | Disposition: A | Discharge status: Home / Self Care | Payer: Medicare Other | Attending: Neurology | Admitting: Neurology

## 2021-01-05 ENCOUNTER — Other Ambulatory Visit
Admission: RE | Admit: 2021-01-05 | Discharge: 2021-01-05 | Disposition: A | Discharge status: Home / Self Care | Payer: Medicare Other | Source: Home / Self Care | Attending: Family | Admitting: Family

## 2021-01-05 ENCOUNTER — Telehealth: Payer: Self-pay | Admitting: *Deleted

## 2021-01-05 DIAGNOSIS — E559 Vitamin D deficiency, unspecified: Secondary | ICD-10-CM | POA: Insufficient documentation

## 2021-01-05 DIAGNOSIS — Z79899 Other long term (current) drug therapy: Secondary | ICD-10-CM | POA: Diagnosis not present

## 2021-01-05 DIAGNOSIS — E7800 Pure hypercholesterolemia, unspecified: Secondary | ICD-10-CM | POA: Diagnosis not present

## 2021-01-05 DIAGNOSIS — E538 Deficiency of other specified B group vitamins: Secondary | ICD-10-CM | POA: Insufficient documentation

## 2021-01-05 DIAGNOSIS — R2689 Other abnormalities of gait and mobility: Secondary | ICD-10-CM | POA: Insufficient documentation

## 2021-01-05 LAB — COMPREHENSIVE METABOLIC PANEL
ALT: 16 U/L (ref 0–44)
AST: 23 U/L (ref 15–41)
Albumin: 4.5 g/dL (ref 3.5–5.0)
Alkaline Phosphatase: 47 U/L (ref 38–126)
Anion gap: 9 (ref 5–15)
BUN: 17 mg/dL (ref 8–23)
CO2: 28 mmol/L (ref 22–32)
Calcium: 9.5 mg/dL (ref 8.9–10.3)
Chloride: 102 mmol/L (ref 98–111)
Creatinine, Ser: 0.81 mg/dL (ref 0.44–1.00)
GFR, Estimated: >60 mL/min (ref >60)
Glucose, Bld: 95 mg/dL (ref 70–99)
Potassium: 3.7 mmol/L (ref 3.5–5.1)
Sodium: 139 mmol/L (ref 135–145)
Total Bilirubin: 0.8 mg/dL (ref 0.3–1.2)
Total Protein: 7.7 g/dL (ref 6.5–8.1)

## 2021-01-05 LAB — CBC WITH DIFFERENTIAL/PLATELET
Abs Immature Granulocytes: 0.01 10*3/uL (ref 0.00–0.07)
Basophils Absolute: 0 10*3/uL (ref 0.0–0.1)
Basophils Relative: 1 %
Eosinophils Absolute: 0.1 10*3/uL (ref 0.0–0.5)
Eosinophils Relative: 1 %
HCT: 42.9 % (ref 36.0–46.0)
Hemoglobin: 14.1 g/dL (ref 12.0–15.0)
Immature Granulocytes: 0 %
Lymphocytes Relative: 40 %
Lymphs Abs: 2.2 10*3/uL (ref 0.7–4.0)
MCH: 30.9 pg (ref 26.0–34.0)
MCHC: 32.9 g/dL (ref 30.0–36.0)
MCV: 94.1 fL (ref 80.0–100.0)
Monocytes Absolute: 0.4 10*3/uL (ref 0.1–1.0)
Monocytes Relative: 8 %
Neutro Abs: 2.8 10*3/uL (ref 1.7–7.7)
Neutrophils Relative %: 50 %
Platelets: 221 10*3/uL (ref 150–400)
RBC: 4.56 MIL/uL (ref 3.87–5.11)
RDW: 12.2 % (ref 11.5–15.5)
WBC: 5.6 10*3/uL (ref 4.0–10.5)
nRBC: 0 % (ref 0.0–0.2)

## 2021-01-05 LAB — VITAMIN B12: Vitamin B-12: 975 pg/mL — ABNORMAL HIGH (ref 180–914)

## 2021-01-05 LAB — LIPID PANEL
Cholesterol: 199 mg/dL (ref 0–200)
HDL: 99 mg/dL (ref >40)
LDL Cholesterol: 88 mg/dL (ref 0–99)
Total CHOL/HDL Ratio: 2 RATIO
Triglycerides: 60 mg/dL (ref <150)
VLDL: 12 mg/dL (ref 0–40)

## 2021-01-05 LAB — VITAMIN D 25 HYDROXY (VIT D DEFICIENCY, FRACTURES): Vit D, 25-Hydroxy: 69.35 ng/mL (ref 30–100)

## 2021-01-05 LAB — TSH: TSH: 4.057 u[IU]/mL (ref 0.350–4.500)

## 2021-01-05 NOTE — Telephone Encounter (Signed)
Spoke to pt. Notified of lab results and provider's recc.  Pt verbalized understanding, however, does have hesitation to make changes in Crestor dose at this time.  Pt states that she has had 3 recent falls and is seeing neurology to r/o Parkinson's.  In addition to neurology work up, she is also considering any contributing causes to be assessed including statin as a possibility.  So for this reason, she refuses to change Crestor dose, but does confirm she will continue taking 3 times weekly and will follow up with Dr. Saunders Revel as scheduled on 2/24.

## 2021-01-05 NOTE — Telephone Encounter (Signed)
-----   Message from Loel Dubonnet, NP sent at 01/05/2021 10:10 AM EST ----- Metabolic panel shows normal kidney function, liver function, and electrolytes. Lipid panel shows improvement. Total cholesterol now <200. LDL improved from 135 to 88 with addition of Crestor. Great result! LDL not quite to goal of <70. Would recommend transitioning Crestor to daily instead of three times per week. Recommend repeat CMP and lipid panel in 2 months at the [facility] Can be reviewed again at upcoming appointment with Dr. Saunders Revel.

## 2021-01-05 NOTE — Telephone Encounter (Signed)
Thank you for making me aware. Will CC Dr. Saunders Revel as an Juluis Rainier.   Dr. Saunders Revel, started Crestor 20mg  three times per week at last clinic visit as LDL 135. She is very hesitant regarding cholesterol medications. LDL improved to 88 after addition of Crestor 20mg  three times per week. Recommended increase to daily though she is hesitant and wishes to discuss at upcoming follow up with you 01/14/21.   Loel Dubonnet, NP

## 2021-01-12 DIAGNOSIS — R259 Unspecified abnormal involuntary movements: Secondary | ICD-10-CM | POA: Diagnosis not present

## 2021-01-14 ENCOUNTER — Encounter: Payer: Self-pay | Admitting: Internal Medicine

## 2021-01-14 ENCOUNTER — Ambulatory Visit (INDEPENDENT_AMBULATORY_CARE_PROVIDER_SITE_OTHER): Payer: Medicare Other | Admitting: Internal Medicine

## 2021-01-14 ENCOUNTER — Other Ambulatory Visit: Payer: Self-pay

## 2021-01-14 VITALS — BP 130/80 | HR 69 | Ht 68.0 in | Wt 141.0 lb

## 2021-01-14 DIAGNOSIS — R0600 Dyspnea, unspecified: Secondary | ICD-10-CM | POA: Diagnosis not present

## 2021-01-14 DIAGNOSIS — E785 Hyperlipidemia, unspecified: Secondary | ICD-10-CM | POA: Diagnosis not present

## 2021-01-14 DIAGNOSIS — R0609 Other forms of dyspnea: Secondary | ICD-10-CM

## 2021-01-14 DIAGNOSIS — I2510 Atherosclerotic heart disease of native coronary artery without angina pectoris: Secondary | ICD-10-CM | POA: Diagnosis not present

## 2021-01-14 DIAGNOSIS — W19XXXA Unspecified fall, initial encounter: Secondary | ICD-10-CM

## 2021-01-14 DIAGNOSIS — R296 Repeated falls: Secondary | ICD-10-CM | POA: Diagnosis not present

## 2021-01-14 NOTE — Patient Instructions (Signed)

## 2021-01-14 NOTE — Progress Notes (Unsigned)
Follow-up Outpatient Visit Date: 01/14/2021  Primary Care Provider: Rubye Beach [address] [place] [address]  Chief Complaint: Falls  HPI:  Barbara Gates is a 70 y.o. female with history of hyperlipidemia and endometriosis, who presents for follow-up of shortness of breath.  I met her in 08/2020 for evaluation of exertional dyspnea and "funny wheeze" that had been present for years.  Cardiac CTA showed mild, non-obstructive CAD in the mid LAD.  Since our last visit, Barbara Gates reports that she has fallen 3 times, leading to a neurology evaluation.  She feels as though she has been weaker than usual and off balance.  She denies frank lightheadedness and syncope.  She wonders if rosuvastatin could be causing this, though she has not had any myalgias.  She denies chest pain and palpitations.  Her exertional dyspnea when trying to talk while exercising or climbing stairs is unchanged.  She otherwise has not had any shortness of breath.  --------------------------------------------------------------------------------------------------  Past Medical History:  Diagnosis Date  . Dry eye   . Dyspnea    with exertion  . Endometriosis   . 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 Fransisca Connors  . Hypercalcemia   . Hyperlipidemia    Has history of this  . Motion sickness   . Nodule of vagina   . Osteoporosis   . Osteoporosis   . Redundant colon   . SOB (shortness of breath)    Past Surgical History:  Procedure Laterality Date  . ABDOMINAL HYSTERECTOMY  1994   Total including ovarie. Due to endometriosis  . APPENDECTOMY     with hysterectomy   . BREAST BIOPSY Left 11/08/2012   Dr. Bary Castilla, neg  . CATARACT EXTRACTION W/PHACO Right 05/27/2020   Procedure: CATARACT EXTRACTION PHACO AND INTRAOCULAR LENS PLACEMENT ([place]) RIGHT PANOPTIC LENS;  Surgeon: Leandrew Koyanagi, MD;  Location: [facility];  Service: Ophthalmology;  Laterality: Right;   2.14 0:43.8 4.8%  . CATARACT EXTRACTION W/PHACO Left 07/22/2020   Procedure: CATARACT EXTRACTION PHACO AND INTRAOCULAR LENS PLACEMENT ([facility]) LEFT PANOPTIX LENS 5.31 00:49.1 10.8%;  Surgeon: Leandrew Koyanagi, MD;  Location: [facility];  Service: Ophthalmology;  Laterality: Left;  . granular cell tumor removed    . OTHER SURGICAL HISTORY    . Surgical Lipoma Removal; 12/13/2012  12/13/2012  . VULVAR LESION REMOVAL Left 08/22/2018   Procedure: Excision left vulvar mass, possible sentinel node mapping with blue dye, possible left inguinal lymph node biopsy or dissection;  Surgeon: Mellody Drown, MD;  Location: [facility];  Service: Gynecology;  Laterality: Left;    Current Meds  Medication Sig  . Alendronate Sodium 70 MG TBEF Take 1 tablet by mouth once a week.  Marland Kitchen aspirin 81 MG EC tablet Take 81 mg by mouth 2 (two) times a week. Swallow whole.  . Cholecalciferol (VITAMIN D3) 50 MCG (2000 UT) capsule Take 2,000 Units by mouth daily.  . Magnesium 250 MG TABS Take 2 tablets by mouth daily.   . melatonin 1 MG TABS tablet Take 1 mg by mouth at bedtime as needed.  . Multiple Vitamins-Minerals (OCUVITE-LUTEIN PO) Take 0.5 tablets by mouth daily.  . NON FORMULARY Take 3 capsules by mouth daily at 6 (six) AM. Jarro's Bone Up  . Omega-3 Fatty Acids (FISH OIL) 600 MG CAPS Take 600 mg by mouth daily.  Marland Kitchen PHOSPHATIDYLSERINE PO daily.  Vladimir Faster Glycol-Propyl Glycol (SYSTANE ULTRA OP) Uses 3-4 times daily  . Polyethylene Glycol 3350 (MIRALAX PO)  Take 17 g by mouth daily.   . rosuvastatin (CRESTOR) 20 MG tablet Take 1 tablet (20 mg total) by mouth 3 (three) times a week.  . senna-docusate (SENOKOT-S) 8.6-50 MG tablet Take 2 tablets by mouth every evening.   . Turmeric 450 MG CAPS Take 550 mg by mouth every other day.  . vitamin B-12 (CYANOCOBALAMIN) 500 MCG tablet Take 500 mcg by mouth daily. Quick dissolve (cherry)    Allergies: Other, Raloxifene hcl, Systane [polyethyl glycol-propyl glycol],  Tamoxifen, Xiidra [lifitegrast], and Mango flavor  Social History   Tobacco Use  . Smoking status: Never Smoker  . Smokeless tobacco: Never Used  Vaping Use  . Vaping Use: Never used  Substance Use Topics  . Alcohol use: Yes    Alcohol/week: 2.0 standard drinks    Types: 1 Glasses of wine, 1 Standard drinks or equivalent per week    Comment: 1 glass of wine on Fridays and 1/2 margarita on Sat  . Drug use: No    Family History  Problem Relation Age of Onset  . Cancer Mother        lung, pancreas, melanoma, carcinoid tumors  . Hypertension Mother   . Hyperlipidemia Mother   . Diabetes Mother   . Hypertension Father   . Diabetes Father   . Hyperlipidemia Father   . Parkinson's disease Father   . CAD Father   . Vascular Disease Father        "hardening of the arteries"  . Diabetes Brother   . Hypertension Brother   . Breast cancer Sister 46  . Arthritis Sister   . Hypertension Sister   . Dupuytren's contracture Sister     Review of Systems: A 12-system review of systems was performed and was negative except as noted in the HPI.  --------------------------------------------------------------------------------------------------  Physical Exam: BP 130/80 (BP Location: Left Arm, Patient Position: Sitting, Cuff Size: Normal)   Pulse 69   Ht '5\' 8"'  (1.727 m)   Wt 141 lb (64 kg)   SpO2 97%   BMI 21.44 kg/m   General:  NAD. Neck: No JVD or HJR. Lungs: Clear to auscultation bilaterally without wheezes or crackles. Heart: Regular rate and rhythm without murmurs, rubs, or gallops. Abdomen: Soft, nontender, nondistended. Extremities: No lower extremity edema.  Lab Results  Component Value Date   WBC 5.6 01/05/2021   HGB 14.1 01/05/2021   HCT 42.9 01/05/2021   MCV 94.1 01/05/2021   PLT 221 01/05/2021    Lab Results  Component Value Date   NA 139 01/05/2021   K 3.7 01/05/2021   CL 102 01/05/2021   CO2 28 01/05/2021   BUN 17 01/05/2021   CREATININE 0.81  01/05/2021   GLUCOSE 95 01/05/2021   ALT 16 01/05/2021    Lab Results  Component Value Date   CHOL 199 01/05/2021   HDL 99 01/05/2021   LDLCALC 88 01/05/2021   TRIG 60 01/05/2021   CHOLHDL 2.0 01/05/2021    --------------------------------------------------------------------------------------------------  ASSESSMENT AND PLAN: Dyspnea on exertion: Symptoms are relatively mild, present only when the patient is walking up stairs or an incline and trying to talk.  Evaluation thus far has been largely unrevealing with mild nonobstructive CAD by cardiac CTA and reassuring echocardiogram with preserved LVEF and grade 1 diastolic dysfunction.  I do not believe symptoms are primarily cardiac in nature.  Nonobstructive coronary artery disease: No angina reported.  We will continue to work with medical therapy to help prevent progression of her nonobstructive CAD.  Hyperlipidemia: Recent lipid panel notable for mildly elevated LDL.  I think it is reasonable to continue with rosuvastatin 20 mg 3 days a week with ongoing lifestyle modifications for target LDL less than 70.  Falls: Event seem to be related primarily to disequilibrium/balance problems that have progressed for months to years.  Symptoms predate initiation of rosuvastatin.  I agree with continued balance exercises and ongoing work-up through neurology.  Follow-up: Return to clinic in 6 months.  Nelva Bush, MD 01/14/2021 4:37 PM

## 2021-01-15 ENCOUNTER — Encounter: Payer: Self-pay | Admitting: Internal Medicine

## 2021-01-15 DIAGNOSIS — I2510 Atherosclerotic heart disease of native coronary artery without angina pectoris: Secondary | ICD-10-CM | POA: Insufficient documentation

## 2021-01-21 ENCOUNTER — Other Ambulatory Visit: Payer: Self-pay

## 2021-01-21 ENCOUNTER — Ambulatory Visit (HOSPITAL_COMMUNITY)
Admission: RE | Admit: 2021-01-21 | Discharge: 2021-01-21 | Disposition: A | Discharge status: Home / Self Care | Payer: Medicare Other | Source: Ambulatory Visit | Attending: Neurology | Admitting: Neurology

## 2021-01-21 DIAGNOSIS — R930 Abnormal findings on diagnostic imaging of skull and head, not elsewhere classified: Secondary | ICD-10-CM | POA: Diagnosis not present

## 2021-01-21 DIAGNOSIS — R2689 Other abnormalities of gait and mobility: Secondary | ICD-10-CM | POA: Diagnosis not present

## 2021-02-04 ENCOUNTER — Telehealth: Payer: Self-pay

## 2021-02-04 NOTE — Telephone Encounter (Addendum)
Pt called in to follow up. Advised pt per providers response below. Pt expressed understanding. Pt says that it seems that her symptoms has gotten worse. Pt says that she has a temp of 1.4. pt requested to speak with a triage nurse to be advised further. Pt says that she has tested negative for covid. Advised pt of providers next available. Pt would like to know if provider could see her sooner? Pt feels that she may need an apt for Rx.    Please assist further.

## 2021-02-04 NOTE — Telephone Encounter (Signed)
Copied from [place] [id number]. Topic: General - Call Back - No Documentation >> Feb 04, 2021 11:02 AM Erick Blinks wrote: Best contact: [phone]  Pt is requesting a call back for advice regarding her current symptoms and a volunteering commitment she has tomorrow. Please advise  She is currently experiencing nasal congestion, slight cough, and a fever. She has tested negative for covid and is wondering if it would be safe for her to attend her event tomorrow. Please advise

## 2021-02-04 NOTE — Progress Notes (Signed)
MyChart Video Visit    Virtual Visit via Video Note   This visit type was conducted due to national recommendations for restrictions regarding the COVID-19 Pandemic (e.g. social distancing) in an effort to limit this patient's exposure and mitigate transmission in our community. This patient is at least at moderate risk for complications without adequate follow up. This format is felt to be most appropriate for this patient at this time. Physical exam was limited by quality of the video and audio technology used for the visit.   Patient location: Home Provider location: [facility]  I discussed the limitations of evaluation and management by telemedicine and the availability of in person appointments. The patient expressed understanding and agreed to proceed.  Patient: Barbara Gates   DOB: Mar 07, 1951   70 y.o. Female  MRN: [id number] Visit Date: 02/05/2021  Today's healthcare provider: Mar Daring, PA-C   No chief complaint on file.  Subjective    URI  This is a new problem. The current episode started in the past 7 days. The problem has been gradually worsening. There has been no fever. Associated symptoms include coughing and headaches (Slight headache). Pertinent negatives include no congestion, diarrhea, ear pain, nausea, plugged ear sensation, rhinorrhea, sinus pain, sore throat, swollen glands or wheezing. She has tried acetaminophen and antihistamine for the symptoms. The treatment provided mild relief.     Patient Active Problem List   Diagnosis Date Noted  . CAD in native artery 01/15/2021  . Chest tightness 09/03/2020  . Vulvar lesion 07/31/2018  . Nodule of tendon sheath 07/19/2018  . Chronic pain of both shoulders 04/05/2018  . Dyspnea on exertion 06/01/2016  . Memory impairment of gradual onset 12/30/2015  . Adult idiopathic generalized osteoporosis 06/11/2015  . Colon polyp 06/11/2015  . Closed fracture of proximal end of radius 06/11/2015  . Auditory  impairment 06/11/2015  . Hyperlipidemia LDL goal <70 06/11/2015  . Gonalgia 06/11/2015  . Herpes zona 06/11/2015  . Female genuine stress incontinence 06/11/2015  . Chronic constipation 09/02/2014  . Lipoma of back-left 07/18/2012   Past Medical History:  Diagnosis Date  . Dry eye   . Dyspnea    with exertion  . Endometriosis   . 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 Fransisca Connors  . Hypercalcemia   . Hyperlipidemia    Has history of this  . Motion sickness   . Nodule of vagina   . Osteoporosis   . Osteoporosis   . Redundant colon   . SOB (shortness of breath)       Medications: Outpatient Medications Prior to Visit  Medication Sig  . Alendronate Sodium 70 MG TBEF Take 1 tablet by mouth once a week.  Marland Kitchen aspirin 81 MG EC tablet Take 81 mg by mouth 2 (two) times a week. Swallow whole.  . Cholecalciferol (VITAMIN D3) 50 MCG (2000 UT) capsule Take 2,000 Units by mouth daily.  . Magnesium 250 MG TABS Take 2 tablets by mouth daily.   . melatonin 1 MG TABS tablet Take 1 mg by mouth at bedtime as needed.  . Multiple Vitamins-Minerals (OCUVITE-LUTEIN PO) Take 0.5 tablets by mouth daily.  . NON FORMULARY Take 3 capsules by mouth daily at 6 (six) AM. Jarro's Bone Up  . Omega-3 Fatty Acids (FISH OIL) 600 MG CAPS Take 600 mg by mouth daily.  Marland Kitchen PHOSPHATIDYLSERINE PO daily.  Vladimir Faster Glycol-Propyl Glycol (SYSTANE ULTRA OP) Uses 3-4 times daily  . Polyethylene Glycol 3350 (MIRALAX PO) Take  17 g by mouth daily.   . rosuvastatin (CRESTOR) 20 MG tablet Take 1 tablet (20 mg total) by mouth 3 (three) times a week.  . senna-docusate (SENOKOT-S) 8.6-50 MG tablet Take 2 tablets by mouth every evening.   . Turmeric 450 MG CAPS Take 550 mg by mouth every other day.  . vitamin B-12 (CYANOCOBALAMIN) 500 MCG tablet Take 500 mcg by mouth daily. Quick dissolve (cherry)   No facility-administered medications prior to visit.    Review of Systems  Constitutional: Positive for fatigue. Negative for  activity change, appetite change, chills, diaphoresis, fever and unexpected weight change.  HENT: Positive for postnasal drip. Negative for congestion, ear discharge, ear pain, rhinorrhea, sinus pressure, sinus pain, sore throat, tinnitus, trouble swallowing and voice change.   Eyes: Negative.   Respiratory: Positive for cough and shortness of breath (Chronic issue but pt states this is stable. ). Negative for apnea, choking, chest tightness, wheezing and stridor.   Gastrointestinal: Negative.  Negative for diarrhea and nausea.  Musculoskeletal: Negative for myalgias.  Neurological: Positive for headaches (Slight headache). Negative for dizziness and light-headedness.    Last CBC Lab Results  Component Value Date   WBC 5.6 01/05/2021   HGB 14.1 01/05/2021   HCT 42.9 01/05/2021   MCV 94.1 01/05/2021   MCH 30.9 01/05/2021   RDW 12.2 01/05/2021   PLT 221 54/98/2641   Last metabolic panel Lab Results  Component Value Date   GLUCOSE 95 01/05/2021   NA 139 01/05/2021   K 3.7 01/05/2021   CL 102 01/05/2021   CO2 28 01/05/2021   BUN 17 01/05/2021   CREATININE 0.81 01/05/2021   GFRNONAA >60 01/05/2021   GFRAA 86 02/04/2020   CALCIUM 9.5 01/05/2021   PROT 7.7 01/05/2021   ALBUMIN 4.5 01/05/2021   LABGLOB 2.6 02/04/2020   AGRATIO 1.8 02/04/2020   BILITOT 0.8 01/05/2021   ALKPHOS 47 01/05/2021   AST 23 01/05/2021   ALT 16 01/05/2021   ANIONGAP 9 01/05/2021      Objective    There were no vitals taken for this visit. BP Readings from Last 3 Encounters:  01/14/21 130/80  12/22/20 136/62  11/04/20 118/76   Wt Readings from Last 3 Encounters:  01/14/21 141 lb (64 kg)  12/22/20 143 lb (64.9 kg)  11/04/20 137 lb (62.1 kg)      Physical Exam Vitals reviewed.  Constitutional:      Appearance: Normal appearance. She is well-developed.  HENT:     Head: Normocephalic and atraumatic.  Pulmonary:     Effort: Pulmonary effort is normal. No respiratory distress.   Musculoskeletal:     Cervical back: Normal range of motion and neck supple.  Neurological:     Mental Status: She is alert.  Psychiatric:        Behavior: Behavior normal.        Thought Content: Thought content normal.        Judgment: Judgment normal.        Assessment & Plan     1. Cough Worsening. Will give tessalon as below for cough. Push fluids. Can also use Mucinex for congestion. Nasal saline rinses also can help. Will get covid 19 testing as below. I will f/u pending results.  Call if symptoms do not improve or worsen.  - benzonatate (TESSALON) 100 MG capsule; Take 1 capsule (100 mg total) by mouth 2 (two) times daily as needed for cough.  Dispense: 20 capsule; Refill: 0  2. Viral URI See  above medical treatment plan. - COVID-19, Flu A+B and RSV   No follow-ups on file.     I discussed the assessment and treatment plan with the patient. The patient was provided an opportunity to ask questions and all were answered. The patient agreed with the plan and demonstrated an understanding of the instructions.   The patient was advised to call back or seek an in-person evaluation if the symptoms worsen or if the condition fails to improve as anticipated.  I provided 17 minutes of non-face-to-face time during this encounter.  Reynolds Bowl, PA-C, have reviewed all documentation for this visit. The documentation on 02/23/21 for the exam, diagnosis, procedures, and orders are all accurate and complete.  [place]ye [place] [facility] [phone] (phone) [phone] (fax)  [place]

## 2021-02-04 NOTE — Telephone Encounter (Signed)
Pt advised.  She is schedule for a virtual visit 11am 02/05/2021

## 2021-02-04 NOTE — Telephone Encounter (Signed)
If she is having a fever she should not go. It is recommended to be fever free without medication for 48 hours before being around others to limit exposure and spread.

## 2021-02-05 ENCOUNTER — Other Ambulatory Visit: Payer: Self-pay

## 2021-02-05 ENCOUNTER — Telehealth (INDEPENDENT_AMBULATORY_CARE_PROVIDER_SITE_OTHER): Payer: Medicare Other | Admitting: Physician Assistant

## 2021-02-05 DIAGNOSIS — R059 Cough, unspecified: Secondary | ICD-10-CM

## 2021-02-05 DIAGNOSIS — J069 Acute upper respiratory infection, unspecified: Secondary | ICD-10-CM | POA: Diagnosis not present

## 2021-02-05 DIAGNOSIS — I2510 Atherosclerotic heart disease of native coronary artery without angina pectoris: Secondary | ICD-10-CM

## 2021-02-05 MED ORDER — BENZONATATE 100 MG PO CAPS: 100.0000 mg | ORAL_CAPSULE | Freq: Two times a day (BID) | ORAL | 0 refills | Status: DC | PRN

## 2021-02-08 LAB — COVID-19, FLU A+B AND RSV
Influenza A, NAA: NOT DETECTED
Influenza B, NAA: NOT DETECTED
RSV, NAA: NOT DETECTED
SARS-CoV-2, NAA: NOT DETECTED

## 2021-02-15 ENCOUNTER — Other Ambulatory Visit: Payer: Self-pay | Admitting: Physician Assistant

## 2021-02-15 DIAGNOSIS — Z1231 Encounter for screening mammogram for malignant neoplasm of breast: Secondary | ICD-10-CM

## 2021-02-17 DIAGNOSIS — G20 Parkinson's disease: Secondary | ICD-10-CM | POA: Diagnosis not present

## 2021-02-20 DIAGNOSIS — G20 Parkinson's disease: Secondary | ICD-10-CM | POA: Insufficient documentation

## 2021-02-23 ENCOUNTER — Encounter: Payer: Self-pay | Admitting: Physician Assistant

## 2021-03-18 ENCOUNTER — Ambulatory Visit
Admission: RE | Admit: 2021-03-18 | Discharge: 2021-03-18 | Disposition: A | Discharge status: Home / Self Care | Payer: Medicare Other | Source: Ambulatory Visit | Attending: Physician Assistant | Admitting: Physician Assistant

## 2021-03-18 ENCOUNTER — Other Ambulatory Visit: Payer: Self-pay

## 2021-03-18 DIAGNOSIS — Z1231 Encounter for screening mammogram for malignant neoplasm of breast: Secondary | ICD-10-CM | POA: Insufficient documentation

## 2021-03-21 DIAGNOSIS — G20A1 Parkinson's disease without dyskinesia, without mention of fluctuations: Secondary | ICD-10-CM

## 2021-03-21 DIAGNOSIS — G20 Parkinson's disease: Secondary | ICD-10-CM

## 2021-03-21 HISTORY — DX: Parkinson's disease: G20

## 2021-03-21 HISTORY — DX: Parkinson's disease without dyskinesia, without mention of fluctuations: G20.A1

## 2021-03-24 DIAGNOSIS — M810 Age-related osteoporosis without current pathological fracture: Secondary | ICD-10-CM | POA: Diagnosis not present

## 2021-03-25 DIAGNOSIS — G20 Parkinson's disease: Secondary | ICD-10-CM | POA: Diagnosis not present

## 2021-04-14 DIAGNOSIS — Z23 Encounter for immunization: Secondary | ICD-10-CM | POA: Diagnosis not present

## 2021-04-24 DIAGNOSIS — Z961 Presence of intraocular lens: Secondary | ICD-10-CM | POA: Diagnosis not present

## 2021-04-24 DIAGNOSIS — H04123 Dry eye syndrome of bilateral lacrimal glands: Secondary | ICD-10-CM | POA: Diagnosis not present

## 2021-04-24 DIAGNOSIS — H43813 Vitreous degeneration, bilateral: Secondary | ICD-10-CM | POA: Diagnosis not present

## 2021-04-24 DIAGNOSIS — H40013 Open angle with borderline findings, low risk, bilateral: Secondary | ICD-10-CM | POA: Diagnosis not present

## 2021-04-29 ENCOUNTER — Telehealth: Payer: Self-pay

## 2021-04-29 NOTE — Telephone Encounter (Signed)
Have you seen this form?  Thanks,   -Mickel Baas

## 2021-04-29 NOTE — Telephone Encounter (Signed)
Found the form in under Media tab.  Printed it and handed to the pt.   Thanks,   -Mickel Baas

## 2021-04-29 NOTE — Telephone Encounter (Signed)
Copied from [place] [phone]. Topic: General - Other >> Apr 13, 2021 10:44 AM Pawlus, Brayton Layman A wrote: Reason for CRM: Pt needed a doctors signature to start taking boxing classes, pt stated she will fax over the form to be completed. Please call pt once you have the form. >> Apr 29, 2021  9:55 AM Leward Quan A wrote: Patient called in will be stopping by the office today to pick up a copy of this form signed by Dr B say that it was faxed on 04/16/21. Please have copy at front desk for pick up. Thank you  >> Apr 15, 2021  2:22 PM Greggory Keen D wrote: Pt called back asking if we have recieved a form that states she can participate in the boxing classes at her gym.  I checked the media and did not see anything scanned in.  Pt would like to be able to pick this up tomorrow if possible.  CB#  [phone]  Please leave message if she does not pick up

## 2021-05-17 ENCOUNTER — Other Ambulatory Visit: Payer: Self-pay | Admitting: Family

## 2021-05-18 NOTE — Telephone Encounter (Signed)
Rx(s) sent to pharmacy electronically.  

## 2021-07-14 ENCOUNTER — Ambulatory Visit (INDEPENDENT_AMBULATORY_CARE_PROVIDER_SITE_OTHER): Payer: Medicare Other | Admitting: Internal Medicine

## 2021-07-14 ENCOUNTER — Other Ambulatory Visit: Payer: Self-pay

## 2021-07-14 ENCOUNTER — Encounter: Payer: Self-pay | Admitting: Internal Medicine

## 2021-07-14 VITALS — BP 158/82 | HR 53 | Ht 68.0 in | Wt 141.4 lb

## 2021-07-14 DIAGNOSIS — R030 Elevated blood-pressure reading, without diagnosis of hypertension: Secondary | ICD-10-CM

## 2021-07-14 DIAGNOSIS — E785 Hyperlipidemia, unspecified: Secondary | ICD-10-CM | POA: Diagnosis not present

## 2021-07-14 DIAGNOSIS — I2510 Atherosclerotic heart disease of native coronary artery without angina pectoris: Secondary | ICD-10-CM

## 2021-07-14 NOTE — Progress Notes (Signed)
Follow-up Outpatient Visit Date: 07/14/2021  Primary Care Provider: Pcp, No No address on file  Chief Complaint: Concerns about statin therapy  HPI:  Ms. Barbara Gates is a 70 y.o. female with history of nonobstructive coronary artery disease, hyperlipidemia and endometriosis, who presents for follow-up of shortness of breath.  I last saw her in February, at which time Barbara Gates reported 3 falls leading to neurology evaluation for work-up of weakness and balance problems.  She was concerned about rosuvastatin contributing to the symptoms.  We did not make any medication changes.  Since her last visit, Barbara Gates reports that she has been undergoing work-up by neurology for possible Parkinson's disease.  She is concerned about continued statin therapy, particularly with rosuvastatin, as she has found information on the Internet that this could worsen Parkinson's disease.  She continues to have some balance issues but has not fallen since our prior visit.  She is doing exercises to help with this.  She has stable exertional dyspnea when climbing stairs.  She denies chest pain, palpitations, and edema.  Home blood pressure is typically better than today's reading, usually in the 130s over 70s.  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 Fransisca Connors   Hypercalcemia    Hyperlipidemia    Has history of this   Motion sickness    Nodule of vagina    Osteoporosis    Osteoporosis    Redundant colon    SOB (shortness of breath)    Past Surgical History:  Procedure Laterality Date   ABDOMINAL HYSTERECTOMY  1994   Total including ovarie. Due to endometriosis   APPENDECTOMY     with hysterectomy    BREAST BIOPSY Left 11/08/2012   Dr. Bary Castilla,  neg   CATARACT EXTRACTION W/PHACO Right 05/27/2020   Procedure: CATARACT EXTRACTION PHACO AND INTRAOCULAR LENS PLACEMENT ([place]) RIGHT PANOPTIC LENS;  Surgeon: Leandrew Koyanagi, MD;  Location: [facility];  Service: Ophthalmology;  Laterality: Right;  2.14 0:43.8 4.8%   CATARACT EXTRACTION W/PHACO Left 07/22/2020   Procedure: CATARACT EXTRACTION PHACO AND INTRAOCULAR LENS PLACEMENT ([facility]) LEFT PANOPTIX LENS 5.31 00:49.1 10.8%;  Surgeon: Leandrew Koyanagi, MD;  Location: [place];  Service: Ophthalmology;  Laterality: Left;   granular cell tumor removed     OTHER SURGICAL HISTORY     Surgical Lipoma Removal; 12/13/2012  12/13/2012   VULVAR LESION REMOVAL Left 08/22/2018   Procedure: Excision left vulvar mass, possible sentinel node mapping with blue dye, possible left inguinal lymph node biopsy or dissection;  Surgeon: Mellody Drown, MD;  Location: [facility];  Service: Gynecology;  Laterality: Left;    Current Meds  Medication Sig   Alendronate Sodium 70 MG TBEF Take 1 tablet by mouth once a week.   aspirin 81 MG EC tablet Take 81 mg by mouth 2 (two) times a week. Swallow whole.   Cholecalciferol (VITAMIN D3) 50 MCG (2000 UT) capsule Take 2,000 Units by mouth daily.   Magnesium 250 MG TABS Take 2 tablets by mouth daily.    melatonin 1 MG TABS tablet Take 1 mg by mouth at bedtime as needed.   Multiple Vitamins-Minerals (OCUVITE-LUTEIN PO) Take 0.5 tablets by mouth daily.   NON FORMULARY Take 3 capsules by mouth daily at 6 (six) AM. Jarro's Bone  Up   Omega-3 Fatty Acids (FISH OIL) 600 MG CAPS Take 600 mg by mouth daily.   PHOSPHATIDYLSERINE PO daily.   Polyethylene Glycol 3350 (MIRALAX PO) Take 17 g by mouth daily.    rosuvastatin (CRESTOR) 20 MG tablet TAKE 1 TABLET BY MOUTH 3 TIMES A WEEK.   senna-docusate (SENOKOT-S) 8.6-50 MG tablet Take 2 tablets by mouth every evening.    Turmeric 450 MG CAPS Take 550 mg by mouth every other day.   vitamin B-12 (CYANOCOBALAMIN) 500  MCG tablet Take 500 mcg by mouth daily. Quick dissolve (cherry)    Allergies: Other, Raloxifene hcl, Systane [polyethyl glycol-propyl glycol], Tamoxifen, Xiidra [lifitegrast], and Mango flavor  Social History   Tobacco Use   Smoking status: Never   Smokeless tobacco: Never  Vaping Use   Vaping Use: Never used  Substance Use Topics   Alcohol use: Yes    Alcohol/week: 2.0 standard drinks    Types: 1 Glasses of wine, 1 Standard drinks or equivalent per week    Comment: 1 glass of wine on Fridays and 1/2 margarita on Sat   Drug use: No    Family History  Problem Relation Age of Onset   Cancer Mother        lung, pancreas, melanoma, carcinoid tumors   Hypertension Mother    Hyperlipidemia Mother    Diabetes Mother    Hypertension Father    Diabetes Father    Hyperlipidemia Father    Parkinson's disease Father    CAD Father    Vascular Disease Father        "hardening of the arteries"   Diabetes Brother    Hypertension Brother    Breast cancer Sister 71   Arthritis Sister    Hypertension Sister    Dupuytren's contracture Sister     Review of Systems: A 12-system review of systems was performed and was negative except as noted in the HPI.  --------------------------------------------------------------------------------------------------  Physical Exam: BP (!) 158/82 (BP Location: Left Arm, Patient Position: Sitting, Cuff Size: Normal)   Pulse (!) 53   Ht '5\' 8"'$  (1.727 m)   Wt 141 lb 6 oz (64.1 kg)   SpO2 99%   BMI 21.50 kg/m   General:  NAD. Neck: No JVD or HJR. Lungs: Clear to auscultation bilaterally without wheezes or crackles. Heart: Bradycardic but regular without murmurs, rubs, or gallops. Abdomen: Soft, nontender, nondistended. Extremities: No lower extremity edema.  EKG: Sinus bradycardia (heart rate 53 bpm).  Otherwise, no significant abnormality.  Heart rate has decreased since prior tracing on 09/02/2020.  Lab Results  Component Value Date   WBC  5.6 01/05/2021   HGB 14.1 01/05/2021   HCT 42.9 01/05/2021   MCV 94.1 01/05/2021   PLT 221 01/05/2021    Lab Results  Component Value Date   NA 139 01/05/2021   K 3.7 01/05/2021   CL 102 01/05/2021   CO2 28 01/05/2021   BUN 17 01/05/2021   CREATININE 0.81 01/05/2021   GLUCOSE 95 01/05/2021   ALT 16 01/05/2021    Lab Results  Component Value Date   CHOL 199 01/05/2021   HDL 99 01/05/2021   LDLCALC 88 01/05/2021   TRIG 60 01/05/2021   CHOLHDL 2.0 01/05/2021    --------------------------------------------------------------------------------------------------  ASSESSMENT AND PLAN: Nonobstructive coronary artery disease and hyperlipidemia: Ms. Lamay has not had any further angina.  She has stable exertional dyspnea when climbing stairs.  Coronary CTA last year showed mild plaque in the mid LAD with a  calcium score of 76 (73rd percentile).  She is concerned today about continued statin therapy given ongoing evaluation for Parkinson's disease.  My brief review of the literature suggests inconclusive findings regarding potential negative effects of statin use in the setting of Parkinson's.  We have agreed to a 55-monthstatin holiday with repeat lipid panel at that time to assess her hyperlipidemia.  Based on results, we may need to reach out to our pharmacy team to help find alternative lipid therapy.  I think it would be helpful for her to speak with her neurologist as well regarding risks of statin use from a neurologic standpoint.  Ms. SHiceshould continue low-dose aspirin and work on lifestyle modifications.  Elevated blood pressure: Blood pressure mildly elevated today but typically normal at home.  An element of whitecoat hypertension may be playing a role.  We will not add any medications today.  I have asked Ms. [place] to monitor her blood pressure at home and to alert [place] her PCP if it is consistently above 140/90.  Follow-up: Return to clinic in 1  year.  CNelva Bush MD 07/14/2021 4:42 PM

## 2021-07-14 NOTE — Patient Instructions (Signed)
Medication Instructions:   Your physician has recommended you make the following change in your medication:   STOP Crestor (Rosuvastatin)  *If you need a refill on your cardiac medications before your next appointment, please call your pharmacy*   Lab Work:  Your physician recommends that you return for lab work in: [place]   - Please make sure that you are FASTING for these labs  Testing/Procedures:  None ordered  Follow-Up: At [facility], you and your health needs are our priority.  As part of our continuing mission to provide you with exceptional heart care, we have created designated Provider Care Teams.  These Care Teams include your primary Cardiologist (physician) and Advanced Practice Providers (APPs -  Physician Assistants and Nurse Practitioners) who all work together to provide you with the care you need, when you need it.  We recommend signing up for the patient portal called "MyChart".  Sign up information is provided on this After Visit Summary.  MyChart is used to connect with patients for Virtual Visits (Telemedicine).  Patients are able to view lab/test results, encounter notes, upcoming appointments, etc.  Non-urgent messages can be sent to your provider as well.   To learn more about what you can do with MyChart, go to [email or web address].    Your next appointment:   1 year(s)  The format for your next appointment:   In Person  Provider:   You may see Nelva Bush, MD or one of the following Advanced Practice Providers on your designated Care Team:   Murray Hodgkins, NP Christell Faith, PA-C Marrianne Mood, PA-C Cadence [place], [place]

## 2021-07-16 ENCOUNTER — Encounter: Payer: Self-pay | Admitting: Internal Medicine

## 2021-08-09 ENCOUNTER — Ambulatory Visit (INDEPENDENT_AMBULATORY_CARE_PROVIDER_SITE_OTHER): Payer: Medicare Other | Admitting: Dermatology

## 2021-08-09 ENCOUNTER — Other Ambulatory Visit: Payer: Self-pay

## 2021-08-09 DIAGNOSIS — D225 Melanocytic nevi of trunk: Secondary | ICD-10-CM | POA: Diagnosis not present

## 2021-08-09 DIAGNOSIS — Z86018 Personal history of other benign neoplasm: Secondary | ICD-10-CM | POA: Diagnosis not present

## 2021-08-09 DIAGNOSIS — L720 Epidermal cyst: Secondary | ICD-10-CM | POA: Diagnosis not present

## 2021-08-09 DIAGNOSIS — L300 Nummular dermatitis: Secondary | ICD-10-CM | POA: Diagnosis not present

## 2021-08-09 DIAGNOSIS — L814 Other melanin hyperpigmentation: Secondary | ICD-10-CM | POA: Diagnosis not present

## 2021-08-09 DIAGNOSIS — D229 Melanocytic nevi, unspecified: Secondary | ICD-10-CM

## 2021-08-09 DIAGNOSIS — D1800 Hemangioma unspecified site: Secondary | ICD-10-CM

## 2021-08-09 DIAGNOSIS — L578 Other skin changes due to chronic exposure to nonionizing radiation: Secondary | ICD-10-CM | POA: Diagnosis not present

## 2021-08-09 DIAGNOSIS — Z1283 Encounter for screening for malignant neoplasm of skin: Secondary | ICD-10-CM | POA: Diagnosis not present

## 2021-08-09 DIAGNOSIS — I2510 Atherosclerotic heart disease of native coronary artery without angina pectoris: Secondary | ICD-10-CM

## 2021-08-09 DIAGNOSIS — L821 Other seborrheic keratosis: Secondary | ICD-10-CM | POA: Diagnosis not present

## 2021-08-09 MED ORDER — TRIAMCINOLONE ACETONIDE 0.1 % EX CREA: 1.0000 "application " | TOPICAL_CREAM | Freq: Two times a day (BID) | CUTANEOUS | 1 refills | Status: DC

## 2021-08-09 MED ORDER — TRETINOIN 0.05 % EX CREA: TOPICAL_CREAM | Freq: Every day | CUTANEOUS | 3 refills | Status: DC

## 2021-08-09 NOTE — Patient Instructions (Signed)

## 2021-08-09 NOTE — Progress Notes (Signed)
Follow-Up Visit   Subjective  Barbara Gates is a 70 y.o. female who presents for the following: Total body skin exam (Hx of dysplastic nevus R lat buttock) and check spot (L cheek, yrs, pt picks at it).  Patient accompanied by husband who contributes to history.  The following portions of the chart were reviewed this encounter and updated as appropriate:       Review of Systems:  No other skin or systemic complaints except as noted in HPI or Assessment and Plan.  Objective  Well appearing patient in no apparent distress; mood and affect are within normal limits.  A full examination was performed including scalp, head, eyes, ears, nose, lips, neck, chest, axillae, abdomen, back, buttocks, bilateral upper extremities, bilateral lower extremities, hands, feet, fingers, toes, fingernails, and toenails. All findings within normal limits unless otherwise noted below.  R lat buttocks Scar with no evidence of recurrence.   R post shoulder Mild xerosis, pt c/o itching  abdomen; central lower abdomen; Right lower flank  Right lower flank 4.0 x 2.49m med dark brown macule  central lower abdomen 5.0 x 4.047mspeckled brown macule  abdomen Brown macules, stable compared to previous photo  L zygoma, R nasal ala crease, forehead Firm white paps   Assessment & Plan   Lentigines - Scattered tan macules - Due to sun exposure - Benign-appearing, observe - Recommend daily broad spectrum sunscreen SPF 30+ to sun-exposed areas, reapply every 2 hours as needed. - Call for any changes - back, chest, arms  Seborrheic Keratoses - Stuck-on, waxy, tan-brown papules and/or plaques  - Benign-appearing - Discussed benign etiology and prognosis. - Observe - Call for any changes - back, chest, face  Melanocytic Nevi - Tan-brown and/or pink-flesh-colored symmetric macules and papules - Benign appearing on exam today - Observation - Call clinic for new or changing moles - Recommend  daily use of broad spectrum spf 30+ sunscreen to sun-exposed areas.  - back, abdomen  Hemangiomas - Red papules - Discussed benign nature - Observe - Call for any changes - trunk, arms  Actinic Damage - Chronic condition, secondary to cumulative UV/sun exposure - diffuse scaly erythematous macules with underlying dyspigmentation - Recommend daily broad spectrum sunscreen SPF 30+ to sun-exposed areas, reapply every 2 hours as needed.  - Staying in the shade or wearing long sleeves, sun glasses (UVA+UVB protection) and wide brim hats (4-inch brim around the entire circumference of the hat) are also recommended for sun protection.  - Call for new or changing lesions. - chest  Skin cancer screening performed today.  History of dysplastic nevus R lat buttocks  Clear. Observe for recurrence. Call clinic for new or changing lesions.  Recommend regular skin exams, daily broad-spectrum spf 30+ sunscreen use, and photoprotection.    Nummular dermatitis R post shoulder  Start TMC 0.1% cr qd/bid prn itch avoid f/g/a  Recommend mild soap and moisturizing cream 1-2 times daily.  Gentle skin care handout provided.    triamcinolone cream (KENALOG) 0.1 % - R post shoulder Apply 1 application topically 2 (two) times daily. Bid aa itchy rash on right posterior shoulder until clear, then prn flares, avoid face, groin, axilla  Nevus (3) abdomen; central lower abdomen; Right lower flank  Benign-appearing.  Stable. Observation.  Call clinic for new or changing moles.  Recommend daily use of broad spectrum spf 30+ sunscreen to sun-exposed areas.    Milia L zygoma, R nasal ala crease, forehead  Benign, observe.    Discussed  extraction to larger lesion on L zygoma since bothersome. Pt defers at this time.  Start Tretinoin 0.05% cr qhs to face as tolerated  Topical retinoid medications like tretinoin/Retin-A, adapalene/Differin, tazarotene/Fabior, and Epiduo/Epiduo Forte can cause dryness and  irritation when first started. Only apply a pea-sized amount to the entire affected area. Avoid applying it around the eyes, edges of mouth and creases at the nose. If you experience irritation, use a good moisturizer first and/or apply the medicine less often. If you are doing well with the medicine, you can increase how often you use it until you are applying every night. Be careful with sun protection while using this medication as it can make you sensitive to the sun. This medicine should not be used by pregnant women.    tretinoin (RETIN-A) 0.05 % cream - L zygoma, R nasal ala crease, forehead Apply topically at bedtime. Qhs to face for milia  Return in about 1 year (around 08/09/2022) for TBSE, Hx of Dysplastic nevi.  I, Othelia Pulling, RMA, am acting as scribe for Brendolyn Patty, MD .   Documentation: I have reviewed the above documentation for accuracy and completeness, and I agree with the above.  Brendolyn Patty MD

## 2021-08-18 DIAGNOSIS — Z23 Encounter for immunization: Secondary | ICD-10-CM | POA: Diagnosis not present

## 2021-08-19 DIAGNOSIS — I2510 Atherosclerotic heart disease of native coronary artery without angina pectoris: Secondary | ICD-10-CM | POA: Diagnosis not present

## 2021-08-19 DIAGNOSIS — R270 Ataxia, unspecified: Secondary | ICD-10-CM | POA: Diagnosis not present

## 2021-08-19 DIAGNOSIS — Z0000 Encounter for general adult medical examination without abnormal findings: Secondary | ICD-10-CM | POA: Diagnosis not present

## 2021-08-19 DIAGNOSIS — K5909 Other constipation: Secondary | ICD-10-CM | POA: Diagnosis not present

## 2021-08-19 DIAGNOSIS — Z1159 Encounter for screening for other viral diseases: Secondary | ICD-10-CM | POA: Diagnosis not present

## 2021-08-19 DIAGNOSIS — G20 Parkinson's disease: Secondary | ICD-10-CM | POA: Diagnosis not present

## 2021-08-19 DIAGNOSIS — Z780 Asymptomatic menopausal state: Secondary | ICD-10-CM | POA: Diagnosis not present

## 2021-08-19 DIAGNOSIS — Z7689 Persons encountering health services in other specified circumstances: Secondary | ICD-10-CM | POA: Diagnosis not present

## 2021-08-23 DIAGNOSIS — G20 Parkinson's disease: Secondary | ICD-10-CM | POA: Diagnosis not present

## 2021-09-06 DIAGNOSIS — R4189 Other symptoms and signs involving cognitive functions and awareness: Secondary | ICD-10-CM | POA: Diagnosis not present

## 2021-09-06 DIAGNOSIS — Z820 Family history of epilepsy and other diseases of the nervous system: Secondary | ICD-10-CM | POA: Diagnosis not present

## 2021-09-17 DIAGNOSIS — H40013 Open angle with borderline findings, low risk, bilateral: Secondary | ICD-10-CM | POA: Diagnosis not present

## 2021-10-04 ENCOUNTER — Other Ambulatory Visit (INDEPENDENT_AMBULATORY_CARE_PROVIDER_SITE_OTHER): Payer: Medicare Other

## 2021-10-04 ENCOUNTER — Other Ambulatory Visit: Payer: Self-pay

## 2021-10-04 DIAGNOSIS — E785 Hyperlipidemia, unspecified: Secondary | ICD-10-CM | POA: Diagnosis not present

## 2021-10-05 LAB — LIPID PANEL
Chol/HDL Ratio: 2.6 ratio (ref 0.0–4.4)
Cholesterol, Total: 210 mg/dL — ABNORMAL HIGH (ref 100–199)
HDL: 81 mg/dL (ref >39)
LDL Chol Calc (NIH): 116 mg/dL — ABNORMAL HIGH (ref 0–99)
Triglycerides: 74 mg/dL (ref 0–149)
VLDL Cholesterol Cal: 13 mg/dL (ref 5–40)

## 2021-10-06 ENCOUNTER — Telehealth: Payer: Self-pay | Admitting: *Deleted

## 2021-10-06 DIAGNOSIS — E785 Hyperlipidemia, unspecified: Secondary | ICD-10-CM

## 2021-10-06 NOTE — Telephone Encounter (Signed)
Attempted to call pt. No answer. Lmtcb.  

## 2021-10-06 NOTE — Telephone Encounter (Signed)
-----   Message from Nelva Bush, MD sent at 10/05/2021  4:29 PM EST ----- Please let Ms. Santini know that her lipids have worsened since August (LDL has gone up from 88 to 116).  Given her coronary artery disease, I recommend adding back medication to help lower her cholesterol.  Ideally, we would put her back on a statin.  If she remains reluctant to retry a statin, I suggest we refer her to our pharmacy lipid clinic to discuss alternative options.

## 2021-10-07 NOTE — Telephone Encounter (Signed)
Patient returning call for results.  She has reviewed on mychart and is ok to send a mychart msg to discuss poc below .

## 2021-10-07 NOTE — Telephone Encounter (Signed)
Spoke with pt. Notified of lab results and Dr. Darnelle Bos recc.  Pt voiced understanding.  Pt is still reluctant to start a statin.  Pt does agree to pharmacy lipid clinic to discuss alternative options.  Referral placed. Pt aware she will be contacted to schedule appointment.

## 2021-11-04 ENCOUNTER — Ambulatory Visit (INDEPENDENT_AMBULATORY_CARE_PROVIDER_SITE_OTHER): Payer: Medicare Other | Admitting: Pharmacist

## 2021-11-04 ENCOUNTER — Other Ambulatory Visit: Payer: Self-pay

## 2021-11-04 VITALS — BP 136/78 | HR 70 | Resp 17 | Ht 68.0 in | Wt 143.8 lb

## 2021-11-04 DIAGNOSIS — E785 Hyperlipidemia, unspecified: Secondary | ICD-10-CM | POA: Diagnosis not present

## 2021-11-04 DIAGNOSIS — I2510 Atherosclerotic heart disease of native coronary artery without angina pectoris: Secondary | ICD-10-CM

## 2021-11-04 DIAGNOSIS — H04123 Dry eye syndrome of bilateral lacrimal glands: Secondary | ICD-10-CM | POA: Insufficient documentation

## 2021-11-04 NOTE — Progress Notes (Signed)
Patient ID: Barbara Gates                 DOB: May 17, 1951                    MRN: [id number]     HPI: Barbara Gates is a 70 y.o. female patient referred to lipid clinic by Dr End. PMH is significant for CAD, HLD, and SOB and elevated calcium score.  Patient currenlty being worked up for [employer].  Patient presents today in good spirits.  Seen by Dr End on 07/14/21 for SOB and falls/weakness.  Was concerned regarding rosuvastatin therapy in that it may be complicating Parkinson's symptoms.  It was agreed she would take a statin holiday.  Repeat lipid panel came back with elevated LDL at 116.  Believes she may have genetic markers for hyperlipidemia and has been researching on her own.  Requests an lp(a) drawn. Has a strong fmaily history of early CAD.   Brought in lab results from lipid panel performed on 04/05/22  TC: 243 Direct LDL: 148 HDL: 86 Trigs: 94 apoB: 110 Lp(a): 50 apoA: 200.2  Current Medications:  Fish oil capsules 600mg  daily  Intolerances:  Rosuvastatin 20mg  daily (discontinued due to possible neurologic symptoms)  Risk Factors:  CAD,  HLD, elevated coronary calcium score, elevated Lp(a), family history  LDL goal: <70  Labs: TC 210, Trigs 74, HDL 81, LDL 116 (10/04/21)  Coronary calcium score of 76.2. This was 73rd percentile for age and sex matched control.  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 Fransisca Connors   Hypercalcemia    Hyperlipidemia    Has history of this   Motion sickness    Nodule of vagina    Osteoporosis    Osteoporosis    Redundant colon    SOB (shortness of breath)     Current Outpatient Medications on File Prior to Visit  Medication Sig Dispense Refill   Alendronate Sodium 70 MG TBEF Take 1 tablet by mouth once a week.     aspirin 81 MG EC tablet Take 81 mg by mouth  2 (two) times a week. Swallow whole.     Cholecalciferol (VITAMIN D3) 50 MCG (2000 UT) capsule Take 2,000 Units by mouth daily.     Magnesium 250 MG TABS Take 2 tablets by mouth daily.      melatonin 1 MG TABS tablet Take 1 mg by mouth at bedtime as needed.     Multiple Vitamins-Minerals (OCUVITE-LUTEIN PO) Take 0.5 tablets by mouth daily.     NON FORMULARY Take 3 capsules by mouth daily at 6 (six) AM. Jarro's Bone Up     Omega-3 Fatty Acids (FISH OIL) 600 MG CAPS Take 600 mg by mouth daily.     PHOSPHATIDYLSERINE PO daily.     Polyethylene Glycol 3350 (MIRALAX PO) Take 17 g by mouth daily.      senna-docusate (SENOKOT-S) 8.6-50 MG tablet Take 2 tablets by mouth every evening.      tretinoin (RETIN-A) 0.05 % cream Apply topically at bedtime. Qhs to face for milia 45 g 3   triamcinolone cream (KENALOG) 0.1 % Apply 1 application topically 2 (two) times daily. Bid aa itchy rash on right posterior shoulder until clear, then prn flares, avoid face,  groin, axilla 30 g 1   Turmeric 450 MG CAPS Take 550 mg by mouth every other day.     vitamin B-12 (CYANOCOBALAMIN) 500 MCG tablet Take 500 mcg by mouth daily. Quick dissolve (cherry)     No current facility-administered medications on file prior to visit.    Allergies  Allergen Reactions   Other Hives    selamectin active ingredient in [employer] Plus for cats.   Raloxifene Hcl Cough        Systane [Polyethyl Glycol-Propyl Glycol] Itching   Tamoxifen Cough   Xiidra [Lifitegrast] Itching   Mango Flavor Itching and Rash    Assessment/Plan:  1. Hyperlipidemia - Patient most recent LDL 116 which is above goal of <70.  Due to elevated coronary calcium score will need further lipid lowering therapy. Since patient is concerned regarding continued statin therapy, will start PCSK9i.  Using demo pen, educated patient on mechanism of action, storage, site selection, administration, and possible adverse effects.  Patient voiced understanding.  Will  complete PA after results of lp(a) come back.  Recheck lipid panel in 2-3 months.  Start Repatha/Praluent sq q 14 days Recheck lipid panel in 2-3 months.  Karren Cobble, PharmD, BCACP, [place], [place], [place], [place], [address] Phone: [phone], Fax: [phone]

## 2021-11-04 NOTE — Patient Instructions (Signed)
It was nice meeting you today  We would like your LDL (bad cholesterol) to be less than 70  We will start a new medication in January called Repatha or Praluent, which you will inject once every 2 weeks  Once you start the medication we will recheck your cholesterol in about 2-3 months  We will check your lipoprotein a today for your baseline  Please call with any questions!  Karren Cobble, PharmD, BCACP, [place], [place], [place], [place], [address] Phone: [phone], Fax: [phone]

## 2021-11-05 DIAGNOSIS — I2510 Atherosclerotic heart disease of native coronary artery without angina pectoris: Secondary | ICD-10-CM | POA: Diagnosis not present

## 2021-11-05 DIAGNOSIS — E785 Hyperlipidemia, unspecified: Secondary | ICD-10-CM | POA: Diagnosis not present

## 2021-11-06 LAB — LIPOPROTEIN A (LPA): Lipoprotein (a): 101 nmol/L — ABNORMAL HIGH (ref <75.0)

## 2021-11-08 ENCOUNTER — Telehealth: Payer: Self-pay | Admitting: Pharmacist

## 2021-11-08 DIAGNOSIS — E785 Hyperlipidemia, unspecified: Secondary | ICD-10-CM

## 2021-11-08 DIAGNOSIS — I25118 Atherosclerotic heart disease of native coronary artery with other forms of angina pectoris: Secondary | ICD-10-CM

## 2021-11-08 NOTE — Telephone Encounter (Signed)
Please complete prior authorization for:  Name of medication, dose, and frequency Repatha 140mg  sq q 14 days or Praluent 75 mg sq q 14 days  Lab Orders Requested? yes  Which labs? Lipid panel  Estimated date for labs to be scheduled 2-95months  Does patient need activated copay card? No. May need healthwell grant assistance

## 2021-11-09 ENCOUNTER — Telehealth: Payer: Self-pay | Admitting: Pharmacist

## 2021-11-09 NOTE — Telephone Encounter (Signed)
Called patient to discuss elevated lp(a). Patient concerned value increasing, wanted to know if there were any ongoing trials.  Current trials have minimum lp(a) of >200 so pt not a candidate. Recommended to start PCSK9i.  Patient requested more information. Sent over article showing PCSK9i can lower lp(a). Patient agreeable to start but will wait until the beginning of next year when new insurance plan starts.

## 2021-11-09 NOTE — Telephone Encounter (Signed)
Pt has lpa resulted and wants to talk to chris pavero rph also the pt stated that their insurance will not start until January 1st. Pt has some questions about possibly doing any trials to help lpa instead of focusing on ldl. I will not start pa until January 1st. Please route back to me when completed if I still need to complete pa but I will hold it in my que until jan 1st

## 2021-11-09 NOTE — Telephone Encounter (Signed)
Discussed with patient.  Will start Repatha/Praluent in 2023 when new insurance starts.  Please complete PA in 2023

## 2021-11-10 NOTE — Telephone Encounter (Signed)
I post dated a msg to myself to the new year so that I can complete a pa on jan 3 when we return to the office.

## 2021-11-23 MED ORDER — REPATHA SURECLICK 140 MG/ML ~~LOC~~ SOAJ: 140.0000 mg | SUBCUTANEOUS | 11 refills | Status: DC

## 2021-11-23 NOTE — Addendum Note (Signed)
Addended by: Allean Found on: 11/23/2021 09:30 AM   Modules accepted: Orders

## 2021-11-23 NOTE — Telephone Encounter (Signed)
Called and spoke w/pt and got the repatha 140mg  biweekly dose. Instructed the pt to complete fasting labs post 4th dose. Pt seemed adamant about ordering lpa so I did that as well as order a lipid panel.

## 2021-11-24 NOTE — Telephone Encounter (Signed)
Per Izora Gala -- Marketing executive   We are able to schedule this patient on a day that we have 2 triage people working -- As a Marine scientist visit for Education.  Awaiting a call back to schedule the patient.

## 2021-11-24 NOTE — Telephone Encounter (Signed)
Called Patient.  No answer. LMOV to call back.

## 2021-11-24 NOTE — Telephone Encounter (Signed)
Pt called and wanted to schedule an appt for demonstration at the [place] office I will route to slayton cma to schedule a time for a demonstration. Pt will bring medication just remind her to do so when you schedule.

## 2021-11-25 NOTE — Telephone Encounter (Signed)
Spoke with patient. Patient will be coming tomorrow @ 2pm for a nurse visit for [place] Education and she will be preforming her first injection here as well.

## 2021-11-25 NOTE — Telephone Encounter (Signed)
Can you please add patient for a nurse visit for [place] education.   11/26/2020 @ 2PM

## 2021-11-26 ENCOUNTER — Other Ambulatory Visit: Payer: Self-pay

## 2021-11-26 ENCOUNTER — Other Ambulatory Visit (INDEPENDENT_AMBULATORY_CARE_PROVIDER_SITE_OTHER): Payer: Medicare Other

## 2021-11-26 ENCOUNTER — Ambulatory Visit: Payer: Medicare Other

## 2021-11-26 DIAGNOSIS — E785 Hyperlipidemia, unspecified: Secondary | ICD-10-CM | POA: Diagnosis not present

## 2021-11-26 NOTE — Patient Instructions (Signed)
Patient arrived for education on administering Repatha.  Patient brought  her Repatha injection with her to self administer in our office. Patient sts that she is known to have allergic reaction do different things so she did not want to administer her injection for the first time at home. She would prefer to be monitored a bit after the injection.  She denies ever having a anaphylactic reaction. Her allergic reactions have consisted of itching and hives.  Educated the patient on the subq injectable sites. Advised her to rotate sites for each injection. Walked the patient through injecting the medication using a sureclick device. Patient self injected her Repatha in her LLQ. Instructed her how to dispose of the used sureclick device.  Monitored the patient for 20 minutes per PharmDs recommendation. Patient had no reaction during that time.  Patient voiced appreciation for the assistance.

## 2021-12-24 ENCOUNTER — Telehealth: Payer: Self-pay | Admitting: Pharmacist

## 2021-12-24 NOTE — Telephone Encounter (Signed)
Patient called.  Reported she has begun having smell disturbances.  Patient has Parkinsons.  At gym, the air fresheners were very strong.  Went to the vet with her cat and had to leave because the animal smell was too strong. Does not know if it is due to first dose of Repatha which she took 2 weeks ago.  Looked up adverse effects and smell disturbances are not listed.  Advised she should reach out to her Parkison's provider as well.  In case Repatha is causing the smell disturbance, recommended she skip her next injection to see if smell disturbance resolves

## 2022-02-14 ENCOUNTER — Telehealth: Payer: Self-pay

## 2022-02-14 NOTE — Telephone Encounter (Signed)
Pt called to state she completed lipid panel at [facility] clinic. She stated was having bleeding from her wrists and left eyeball. She reported that she stopped aspirin and that seemed to be helping. Pt also is worried about a1c since seems elevated. I will route to pharmd pool to address.  ?

## 2022-02-16 NOTE — Telephone Encounter (Signed)
LDL looks great on Repatha. A1c shows she is prediabetic range. Recommend she follow up with PCP regarding A1c as well as bleeding wrists and eyes ?

## 2022-02-16 NOTE — Telephone Encounter (Signed)
Called and lmom pt to call us back regarding result  ?

## 2022-02-17 NOTE — Telephone Encounter (Signed)
Pt called in and results/recommendations were give. The pt stated that they seem to be having a side effect of sensitive smell and would like to speak w/pharmd. She stated that elevated a1c has never happened and that is cause for concern since it is on the side effect list for repatha. She seems to be more concerned with her lpa  than ldl says that repatha has helped to a degree but she thinks its still %50 higher than it should be. She has a pcp appt at 10:30am today. The pt stated to call her back tomorrow on 02/18/22 prior to 10am would be best for her.  ?

## 2022-02-18 NOTE — Telephone Encounter (Addendum)
Spoke with patient.  Reports was having side effects to her Repatha including bleeding eyes, wrists, and elevated A1c. LPA dropped about 15%.  LDL now at goal.  PCP recommended she discontinue Repatha and recheck labs in 3 months.  Advised that bloody eyes and wrists were not common adverse effects to Repatha and unlikely to be caused by the medication.  Blood sugar has been known to increase but not substantially.  However patient is very worried about developing T2DM so is choosing to discontinue. ?

## 2022-03-31 ENCOUNTER — Other Ambulatory Visit: Payer: Self-pay | Admitting: Pulmonary Disease

## 2022-03-31 DIAGNOSIS — R7989 Other specified abnormal findings of blood chemistry: Secondary | ICD-10-CM

## 2022-04-12 ENCOUNTER — Other Ambulatory Visit: Payer: Medicare Other

## 2022-04-14 ENCOUNTER — Ambulatory Visit
Admission: RE | Admit: 2022-04-14 | Discharge: 2022-04-14 | Disposition: A | Discharge status: Home / Self Care | Payer: Medicare Other | Source: Ambulatory Visit | Attending: Pulmonary Disease | Admitting: Pulmonary Disease

## 2022-04-14 DIAGNOSIS — R7989 Other specified abnormal findings of blood chemistry: Secondary | ICD-10-CM

## 2022-04-14 MED ORDER — IOPAMIDOL (ISOVUE-370) INJECTION 76%
75.0000 mL | Freq: Once | INTRAVENOUS | Status: AC | PRN
  Administered 2022-04-14: 75 mL via INTRAVENOUS

## 2022-04-19 ENCOUNTER — Other Ambulatory Visit: Payer: Self-pay | Admitting: Internal Medicine

## 2022-04-19 ENCOUNTER — Telehealth: Payer: Self-pay | Admitting: Pharmacist

## 2022-04-19 DIAGNOSIS — Z1231 Encounter for screening mammogram for malignant neoplasm of breast: Secondary | ICD-10-CM

## 2022-04-19 NOTE — Telephone Encounter (Signed)
Patient called and reported she still occasionally has SOB with exertion.  Had PFTs at pulmonologist and also ran allergen test. Has to avoid sulfites and has a minor allergy to milk.  Has carotid CT scan schedule this Thursday.  Patient was intolerant to statins and Repatha in the past.  Recommended she call back when she has results of carotid CT.

## 2022-05-26 ENCOUNTER — Ambulatory Visit
Admission: RE | Admit: 2022-05-26 | Discharge: 2022-05-26 | Disposition: A | Discharge status: Home / Self Care | Payer: Medicare Other | Source: Ambulatory Visit | Attending: Internal Medicine | Admitting: Internal Medicine

## 2022-05-26 DIAGNOSIS — Z1231 Encounter for screening mammogram for malignant neoplasm of breast: Secondary | ICD-10-CM | POA: Insufficient documentation

## 2022-07-07 ENCOUNTER — Telehealth: Payer: Medicare Other | Admitting: Physician Assistant

## 2022-07-07 DIAGNOSIS — U071 COVID-19: Secondary | ICD-10-CM | POA: Diagnosis not present

## 2022-07-07 MED ORDER — NIRMATRELVIR/RITONAVIR (PAXLOVID)TABLET: 3.0000 | ORAL_TABLET | Freq: Two times a day (BID) | ORAL | 0 refills | Status: AC

## 2022-07-07 NOTE — Patient Instructions (Signed)
Barbara Gates, thank you for joining Rodney Booze, PA-C for today's virtual visit.  While this provider is not your primary care provider (PCP), if your PCP is located in our provider database this encounter information will be shared with them immediately following your visit.  Consent: (Patient) Barbara Gates provided verbal consent for this virtual visit at the beginning of the encounter.  Current Medications:  Current Outpatient Medications:    nirmatrelvir/ritonavir EUA (PAXLOVID) 20 x 150 MG & 10 x '100MG'$  TABS, Take 3 tablets by mouth 2 (two) times daily for 5 days. (Take nirmatrelvir 150 mg two tablets twice daily for 5 days and ritonavir 100 mg one tablet twice daily for 5 days) Patient GFR is 71, Disp: 30 tablet, Rfl: 0   Alendronate Sodium 70 MG TBEF, Take 1 tablet by mouth once a week., Disp: , Rfl:    aspirin 81 MG EC tablet, Take 81 mg by mouth 2 (two) times a week. Swallow whole., Disp: , Rfl:    Cholecalciferol (VITAMIN D3) 50 MCG (2000 UT) capsule, Take 2,000 Units by mouth daily., Disp: , Rfl:    Coenzyme Q10 10 MG capsule, Take 1 capsule by mouth daily., Disp: , Rfl:    cycloSPORINE (RESTASIS) 0.05 % ophthalmic emulsion, Apply 1 drop to eye in the morning and at bedtime., Disp: , Rfl:    Lysine 600 MG TABS, Take 1 tablet by mouth every other day., Disp: , Rfl:    Magnesium 250 MG TABS, Take 2 tablets by mouth daily. , Disp: , Rfl:    melatonin 1 MG TABS tablet, Take 1 mg by mouth at bedtime as needed., Disp: , Rfl:    Multiple Vitamins-Minerals (OCUVITE-LUTEIN PO), Take 0.5 tablets by mouth daily., Disp: , Rfl:    NON FORMULARY, Take 3 capsules by mouth daily at 6 (six) AM. Jarro's Bone Up, Disp: , Rfl:    Omega-3 Fatty Acids (FISH OIL) 600 MG CAPS, Take 600 mg by mouth daily., Disp: , Rfl:    Pediatric Multivit-Minerals-C (ANIMAL CHEWABLE MULTI COMPLETE PO), Take 1 tablet by mouth daily., Disp: , Rfl:    PHOSPHATIDYLSERINE PO, daily., Disp: , Rfl:     Polyethylene Glycol 3350 (MIRALAX PO), Take 17 g by mouth daily. , Disp: , Rfl:    Polyethylene Glycol 400 (BLINK TEARS OP), Apply 1 drop to eye in the morning and at bedtime., Disp: , Rfl:    senna-docusate (SENOKOT-S) 8.6-50 MG tablet, Take 2 tablets by mouth every evening. , Disp: , Rfl:    tretinoin (RETIN-A) 0.05 % cream, Apply topically at bedtime. Qhs to face for milia, Disp: 45 g, Rfl: 3   triamcinolone cream (KENALOG) 0.1 %, Apply 1 application topically 2 (two) times daily. Bid aa itchy rash on right posterior shoulder until clear, then prn flares, avoid face, groin, axilla, Disp: 30 g, Rfl: 1   Turmeric 450 MG CAPS, Take 550 mg by mouth every other day., Disp: , Rfl:    vitamin B-12 (CYANOCOBALAMIN) 500 MCG tablet, Take 500 mcg by mouth daily. Quick dissolve (cherry), Disp: , Rfl:    Medications ordered in this encounter:  Meds ordered this encounter  Medications   nirmatrelvir/ritonavir EUA (PAXLOVID) 20 x 150 MG & 10 x '100MG'$  TABS    Sig: Take 3 tablets by mouth 2 (two) times daily for 5 days. (Take nirmatrelvir 150 mg two tablets twice daily for 5 days and ritonavir 100 mg one tablet twice daily for 5 days) Patient GFR is  64    Dispense:  30 tablet    Refill:  0    Order Specific Question:   Supervising Provider    Answer:   Sabra Heck, BRIAN [[id number]]     *If you need refills on other medications prior to your next appointment, please contact your pharmacy*  Follow-Up: Call back or seek an in-person evaluation if the symptoms worsen or if the condition fails to improve as anticipated.  Other Instructions You should be isolated for at least 5 days since the onset of your symptoms AND >48 hours after symptoms resolution (absence of fever without the use of fever reducing medicaiton and improvement in respiratory symptoms), whichever is longer  Follow up with your regular doctor in 1 week for reassessment and seek care sooner if your symptoms worsen or fail to improve.   If you have  been instructed to have an in-person evaluation today at a local Urgent Care facility, please use the link below. It will take you to a list of all of our available [facility], including address, phone number and hours of operation. Please do not delay care.  [facility]  If you or a family member do not have a primary care provider, use the link below to schedule a visit and establish care. When you choose a [facility] primary care physician or advanced practice provider, you gain a long-term partner in health. Find a Primary Care Provider  Learn more about [facility] in-office and virtual care options: [facility]

## 2022-07-07 NOTE — Progress Notes (Signed)
[name], wool[place] are scheduled for a virtual visit with your provider today.    Just as we do with appointments in the office, we must obtain your consent to participate.  Your consent will be active for this visit and any virtual visit you may have with one of our providers in the next 365 days.    If you have a MyChart account, I can also send a copy of this consent to you electronically.  All virtual visits are billed to your insurance company just like a traditional visit in the office.  As this is a virtual visit, video technology does not allow for your provider to perform a traditional examination.  This may limit your provider's ability to fully assess your condition.  If your provider identifies any concerns that need to be evaluated in person or the need to arrange testing such as labs, EKG, etc, we will make arrangements to do so.    Although advances in technology are sophisticated, we cannot ensure that it will always work on either your end or our end.  If the connection with a video visit is poor, we may have to switch to a telephone visit.  With either a video or telephone visit, we are not always able to ensure that we have a secure connection.   I need to obtain your verbal consent now.   Are you willing to proceed with your visit today?   [name] has provided verbal consent on 07/07/2022 for a virtual visit (video or telephone).   Rodney Booze, PA-C 07/07/2022  4:46 PM   Date:  07/07/2022   ID:  Karie Gates, DOB 10-May-1951, MRN [id number]  Patient Location: Home Provider Location: Home Office   Participants: Patient and Provider for Visit and Wrap up  Method of visit: Video  Location of Patient: Home Location of Provider: Home Office Consent was obtain for visit over the video. Services rendered by provider: Visit was performed via video  A video enabled telemedicine application was used and I verified that I am speaking with the correct person  using two identifiers.  PCP:  Gladstone Lighter, MD   Chief Complaint:  COVID  History of Present Illness:    Barbara Gates is a 71 y.o. female with history as stated below. Presents video telehealth for an acute care visit  Pt tested positive for COVID. She reports that her sxs started yesterday. She has a chronic cough which is slightly worse than normal. She has also has slightly elevated temp today at 100F. Denies any new SOB, chest pain, nv.    Past Medical, Surgical, Social History, Allergies, and Medications have been Reviewed.  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 Fransisca Connors   Hypercalcemia    Hyperlipidemia    Has history of this   Motion sickness    Nodule of vagina    Osteoporosis    Osteoporosis    Redundant colon    SOB (shortness of breath)     Current Meds  Medication Sig   nirmatrelvir/ritonavir EUA (PAXLOVID) 20 x 150 MG & 10 x '100MG'$  TABS Take 3 tablets by mouth 2 (two) times daily for 5 days. (Take nirmatrelvir 150 mg two tablets twice daily for 5 days and ritonavir 100 mg  one tablet twice daily for 5 days) Patient GFR is 71     Allergies:   Other, Raloxifene hcl, Systane [polyethyl glycol-propyl glycol], Tamoxifen, Xiidra [lifitegrast], and Mango flavor   ROS See HPI for history of present illness.  Physical Exam Pulmonary:     Comments: Dry cough, speaking in full sentences, no tacypnea Neurological:     Mental Status: She is alert.      MDM: Pt tested          There are no diagnoses linked to this encounter.   Time:   Today, I have spent 20 minutes with the patient with telehealth technology discussing the above problems, reviewing the chart, previous notes, medications and orders.    Tests Ordered: No orders of the defined types were placed in this  encounter.   Medication Changes: Meds ordered this encounter  Medications   nirmatrelvir/ritonavir EUA (PAXLOVID) 20 x 150 MG & 10 x '100MG'$  TABS    Sig: Take 3 tablets by mouth 2 (two) times daily for 5 days. (Take nirmatrelvir 150 mg two tablets twice daily for 5 days and ritonavir 100 mg one tablet twice daily for 5 days) Patient GFR is 71    Dispense:  30 tablet    Refill:  0    Order Specific Question:   Supervising Provider    Answer:   Noemi Chapel [[id number]]     Disposition:  Follow up  Signed, Rodney Booze, PA-C  07/07/2022 4:46 PM

## 2022-07-10 ENCOUNTER — Telehealth: Payer: Self-pay | Admitting: Internal Medicine

## 2022-07-10 DIAGNOSIS — U071 COVID-19: Secondary | ICD-10-CM

## 2022-07-10 MED ORDER — BENZONATATE 100 MG PO CAPS: 100.0000 mg | ORAL_CAPSULE | Freq: Two times a day (BID) | ORAL | 0 refills | Status: DC | PRN

## 2022-07-10 MED ORDER — FLUTICASONE PROPIONATE 50 MCG/ACT NA SUSP
2.0000 | Freq: Every day | NASAL | 6 refills | Status: DC
Start: 2022-07-10 — End: 2022-07-27

## 2022-07-10 NOTE — Telephone Encounter (Signed)
Patient called on call services, is COVID positive and requesting cough suppressant and something to help with sore throat. Prescriptions sent, patient to call office this week if symptoms worsening or not improving.

## 2022-07-15 ENCOUNTER — Ambulatory Visit: Payer: Medicare Other | Admitting: Medical

## 2022-07-19 ENCOUNTER — Telehealth: Payer: Self-pay

## 2022-07-19 ENCOUNTER — Ambulatory Visit: Payer: Self-pay

## 2022-07-19 ENCOUNTER — Other Ambulatory Visit: Payer: Self-pay

## 2022-07-19 ENCOUNTER — Emergency Department: Payer: Medicare Other

## 2022-07-19 ENCOUNTER — Emergency Department
Admission: EM | Admit: 2022-07-19 | Discharge: 2022-07-19 | Disposition: A | Discharge status: Home / Self Care | Payer: Medicare Other | Attending: Emergency Medicine | Admitting: Emergency Medicine

## 2022-07-19 ENCOUNTER — Encounter: Payer: Self-pay | Admitting: *Deleted

## 2022-07-19 DIAGNOSIS — R059 Cough, unspecified: Secondary | ICD-10-CM | POA: Diagnosis present

## 2022-07-19 DIAGNOSIS — U071 COVID-19: Secondary | ICD-10-CM | POA: Diagnosis not present

## 2022-07-19 DIAGNOSIS — R051 Acute cough: Secondary | ICD-10-CM

## 2022-07-19 LAB — CBC
HCT: 40.9 % (ref 36.0–46.0)
Hemoglobin: 13.1 g/dL (ref 12.0–15.0)
MCH: 30 pg (ref 26.0–34.0)
MCHC: 32 g/dL (ref 30.0–36.0)
MCV: 93.8 fL (ref 80.0–100.0)
Platelets: 277 10*3/uL (ref 150–400)
RBC: 4.36 MIL/uL (ref 3.87–5.11)
RDW: 12.2 % (ref 11.5–15.5)
WBC: 7.9 10*3/uL (ref 4.0–10.5)
nRBC: 0 % (ref 0.0–0.2)

## 2022-07-19 LAB — URINALYSIS, ROUTINE W REFLEX MICROSCOPIC
Bilirubin Urine: NEGATIVE
Glucose, UA: NEGATIVE mg/dL
Hgb urine dipstick: NEGATIVE
Ketones, ur: NEGATIVE mg/dL
Leukocytes,Ua: NEGATIVE
Nitrite: NEGATIVE
Protein, ur: NEGATIVE mg/dL
Specific Gravity, Urine: 1.004 — ABNORMAL LOW (ref 1.005–1.030)
pH: 8 (ref 5.0–8.0)

## 2022-07-19 LAB — TROPONIN I (HIGH SENSITIVITY): Troponin I (High Sensitivity): 5 ng/L (ref <18)

## 2022-07-19 LAB — BASIC METABOLIC PANEL
Anion gap: 10 (ref 5–15)
BUN: 9 mg/dL (ref 8–23)
CO2: 26 mmol/L (ref 22–32)
Calcium: 9.1 mg/dL (ref 8.9–10.3)
Chloride: 103 mmol/L (ref 98–111)
Creatinine, Ser: 0.84 mg/dL (ref 0.44–1.00)
GFR, Estimated: >60 mL/min (ref >60)
Glucose, Bld: 101 mg/dL — ABNORMAL HIGH (ref 70–99)
Potassium: 4.4 mmol/L (ref 3.5–5.1)
Sodium: 139 mmol/L (ref 135–145)

## 2022-07-19 LAB — LACTIC ACID, PLASMA: Lactic Acid, Venous: 1 mmol/L (ref 0.5–1.9)

## 2022-07-19 LAB — SARS CORONAVIRUS 2 BY RT PCR: SARS Coronavirus 2 by RT PCR: POSITIVE — AB

## 2022-07-19 MED ORDER — BENZONATATE 100 MG PO CAPS: 100.0000 mg | ORAL_CAPSULE | Freq: Three times a day (TID) | ORAL | 0 refills | Status: DC | PRN

## 2022-07-19 NOTE — Telephone Encounter (Signed)
Patient called and asked if she's having SOB, CP, fever decreased from earlier. She says no to SOB, CP, fever with a increased temp 102, congested sounding cough. She says she's had a cough for years. I asked since COVID is the congested sounding cough the same or worse, she says it's worse than her normal cough. I advised to go to the ED for evaluation due to the fever and congested cough, return of COVID positive, she needs an in person evaluation. Patient says she's closest to [facility], advised that ED is appropriate. She says she will get dressed and go.

## 2022-07-19 NOTE — Telephone Encounter (Signed)
Chief Complaint:  dry cough and fever Symptoms: nasal congestion, fever to 100.4  Frequency: Came out of 10 day isolation and tested covid neg. Sx returned yesterday Pertinent Negatives: Patient denies SOB, chest pain,  Disposition: '[]'$ ED /'[]'$ Urgent Care (no appt availability in office) / '[]'$ Appointment(In office/virtual)/ '[]'$  [facility]/ '[x]'$ Home Care/ '[]'$ Refused Recommended Disposition /'[]'$ [facility]/ '[]'$  Follow-up with PCP Additional Notes: advised pt to return to home isolation. To monitor work of breathing and to make sure to monitor fevers Advised to call back for any worsening sx.. Advised pt to call back for sustained fevers of 101 to 102 . Advised pt to stay isolated from others until no fever for 24 hours (without taking any fever reducing medication.)and until sx have improved or gone away.  Reason for Disposition  [1] PERSISTING SYMPTOMS OF COVID-19 AND [2] NEW symptom AND [3] that sounds mild  Answer Assessment - Initial Assessment Questions 1. COVID-19 ONSET: "When did the symptoms of COVID-19 first start?"     12 days ago 2. DIAGNOSIS CONFIRMATION: "How were you diagnosed?" (e.g., COVID-19 oral or nasal viral test; COVID-19 antibody test; doctor visit)     Self test 3. MAIN SYMPTOM:  "What is your main concern or symptom right now?" (e.g., breathing difficulty, cough, fatigue. loss of smell)     Cough, nasal congestion 4. SYMPTOM ONSET: "When did the  reoccurrence of sx   start?"     Monday 5. BETTER-SAME-WORSE: "Are you getting better, staying the same, or getting worse over the last 1 to 2 weeks?"     worse 6. RECENT MEDICAL VISIT: "Have you been seen by a healthcare provider (doctor, NP, PA) for these persisting COVID-19 symptoms?" If Yes, ask: "When were you seen?" (e.g., date)     no 7. COUGH: "Do you have a cough?" If Yes, ask: "How bad is the cough?"       Yes- has h/o chronic cough- 8. FEVER: "Do you have a fever?" If Yes, ask: "What is your  temperature, how was it measured, and when did it start?"     100.4 9. BREATHING DIFFICULTY: "Are you having any trouble breathing?" If Yes, ask: "How bad is your breathing?" (e.g., mild, moderate, severe)    - MILD: No SOB at rest, mild SOB with walking, speaks normally in sentences, can lie down, no retractions, pulse < 100.    - MODERATE: SOB at rest, SOB with minimal exertion and prefers to sit, cannot lie down flat, speaks in phrases, mild retractions, audible wheezing, pulse 100-120.    - SEVERE: Very SOB at rest, speaks in single words, struggling to breathe, sitting hunched forward, retractions, pulse > 120.       no 10. OTHER SYMPTOMS: "Do you have any other symptoms?"  (e.g., fatigue, headache, muscle pain, weakness)       Nasal  11. HIGH RISK DISEASE: "Do you have any chronic medical problems?" (e.g., asthma, heart or lung disease, weak immune system, obesity, etc.)       *No Answer* 12. VACCINE: "Have you gotten the COVID-19 vaccine?" If Yes, ask: "Which one, how many shots, when did you get it?"       *No Answer* 13. PREGNANCY: "Is there any chance you are pregnant?" "When was your last menstrual period?"       N/a 14. O2 SATURATION MONITOR:  "Do you use an oxygen saturation monitor (pulse oximeter) at home?" If Yes, ask "What is your reading (oxygen level) today?" "What is  your usual oxygen saturation reading?" (e.g., 95%)       N/a  Protocols used: Coronavirus (COVID-19) Persisting Symptoms Follow-up Call-[facility]

## 2022-07-19 NOTE — ED Provider Notes (Signed)
[facility] Provider Note   Event Date/Time   First MD Initiated Contact with Patient 07/19/22 1943     (approximate) History  Covid Exposure  HPI Barbara Gates is a 71 y.o. female with a recent diagnosis of COVID approximately 1 week prior to arrival who presents for recurrence of her COVID symptoms including a dry cough and fever.  Patient states that she took her home COVID test that was negative yesterday but is concerned that she may have a reinfection. ROS: Patient currently denies any vision changes, tinnitus, difficulty speaking, facial droop, sore throat, chest pain, shortness of breath, abdominal pain, nausea/vomiting/diarrhea, dysuria, or weakness/numbness/paresthesias in any extremity   Physical Exam  Triage Vital Signs: ED Triage Vitals  Enc Vitals Group     BP 07/19/22 1804 114/76     Pulse Rate 07/19/22 1804 72     Resp 07/19/22 1804 20     Temp 07/19/22 1804 (!) 100.7 F (38.2 C)     Temp Source 07/19/22 1804 Oral     SpO2 07/19/22 1804 97 %     Weight 07/19/22 1802 133 lb (60.3 kg)     Height 07/19/22 1802 '5\' 8"'$  (1.727 m)     Head Circumference --      Peak Flow --      Pain Score 07/19/22 1802 1     Pain Loc --      Pain Edu? --      Excl. in [place]? --    Most recent vital signs: Vitals:   07/19/22 1804 07/19/22 2239  BP: 114/76 (!) 142/84  Pulse: 72 63  Resp: 20 17  Temp: (!) 100.7 F (38.2 C) 99.4 F (37.4 C)  SpO2: 97% 94%   General: Awake, oriented x4. CV:  Good peripheral perfusion.  Resp:  Normal effort.  Abd:  No distention.  Other:  Elderly Caucasian female laying in bed in no acute distress ED Results / Procedures / Treatments  Labs (all labs ordered are listed, but only abnormal results are displayed) Labs Reviewed  SARS CORONAVIRUS 2 BY RT PCR - Abnormal; Notable for the following components:      Result Value   SARS Coronavirus 2 by RT PCR POSITIVE (*)    All other components within normal limits  BASIC  METABOLIC PANEL - Abnormal; Notable for the following components:   Glucose, Bld 101 (*)    All other components within normal limits  URINALYSIS, ROUTINE W REFLEX MICROSCOPIC - Abnormal; Notable for the following components:   Color, Urine STRAW (*)    APPearance CLEAR (*)    Specific Gravity, Urine 1.004 (*)    All other components within normal limits  CBC  LACTIC ACID, PLASMA  TROPONIN I (HIGH SENSITIVITY)   EKG ED ECG REPORT I, Naaman Plummer, the attending physician, personally viewed and interpreted this ECG. Date: 07/19/2022 EKG Time: 1825 Rate: 70 Rhythm: normal sinus rhythm QRS Axis: normal Intervals: normal ST/T Wave abnormalities: normal Narrative Interpretation: no evidence of acute ischemia RADIOLOGY ED MD interpretation: One-view portable chest x-ray interpreted by me shows no evidence of acute abnormalities including no pneumonia, pneumothorax, or widened mediastinum -Agree with radiology assessment Official radiology report(s): DG Chest Port 1 View  Result Date: 07/19/2022 CLINICAL DATA:  Fever and cough.  Chest x-ray 04/11/2019 EXAM: PORTABLE CHEST 1 VIEW COMPARISON:  None Available. FINDINGS: The heart size and mediastinal contours are within normal limits. Both lungs are clear. The visualized skeletal structures are  unremarkable. IMPRESSION: No active disease. Electronically Signed   By: Ronney Asters M.D.   On: 07/19/2022 19:24   PROCEDURES: Critical Care performed: No Procedures MEDICATIONS ORDERED IN ED: Medications - No data to display IMPRESSION / MDM / [place] / ED COURSE  I reviewed the triage vital signs and the nursing notes.                             The patient is on the cardiac monitor to evaluate for evidence of arrhythmia and/or significant heart rate changes. Patient's presentation is most consistent with acute presentation with potential threat to life or bodily function. Presentation most consistent with Viral Syndrome.   Patient has tested positive for COVID-19. Based on vitals and exam they are nontoxic and stable for discharge.  Given History and Exam I have a lower suspicion for: Emergent CardioPulmonary causes [such as Acute Asthma or COPD Exacerbation, acute Heart Failure or exacerbation, PE, PTX, atypical ACS, PNA]. Emergent Otolaryngeal causes [such as PTA, RPA, Ludwigs, Epiglottitis, EBV].  Regarding Emergent Travel or Immunosuppressive related infectious: I have a low suspicion for acute HIV.  Will provide strict return precautions and instructions on self-isolation/quarantine and anticipatory guidance.   FINAL CLINICAL IMPRESSION(S) / ED DIAGNOSES   Final diagnoses:  COVID-19 virus infection  Acute cough   Rx / DC Orders   ED Discharge Orders          Ordered    benzonatate (TESSALON PERLES) 100 MG capsule  3 times daily PRN        07/19/22 2204           Note:  This document was prepared using Dragon voice recognition software and may include unintentional dictation errors.   Naaman Plummer, MD 07/19/22 [phone]

## 2022-07-19 NOTE — ED Triage Notes (Addendum)
Pt reports a fever, cough, congestion.for 3 weeks.  Pt was positive for covid 3 weeks ago, sx went away, but returned yesterday, pt still positive for covid per pt.  No chest pain.  No sob.    Pt alert  speech clear.

## 2022-07-19 NOTE — ED Notes (Signed)
See triage note. Pt in NAD; resp reg/unlabored, dry cough present, mask in use by pt; family at bedside.

## 2022-07-19 NOTE — Telephone Encounter (Signed)
100.4 covid test positive- pt stated she tested negative on Sunday and was around other without mask yesterday. Sx came back last night and worsened this am. Pt retested and test was positive. Advised pt test on Sunday was most likely false negative. Pt has cough, and nasal congestion. Denies SOB or breathing difficulties. Sent pt MyChart message per her request about isolation protocol.

## 2022-07-26 NOTE — Progress Notes (Unsigned)
    I,Barbara Gates,acting as a Education administrator for [employer], Barbara Gates.,have documented all relevant documentation on the behalf of Barbara Kirschner, Barbara Gates,as directed by  Barbara Kirschner, Barbara Gates while in the presence of Barbara Kirschner, Barbara Gates.  Established patient visit   Patient: Barbara Gates   DOB: October 13, 1951   71 y.o. Female  MRN: [id number] Visit Date: 07/27/2022  Today's healthcare provider: Mikey Kirschner, Barbara Gates   No chief complaint on file.  Subjective    HPI  ***  Medications: Outpatient Medications Prior to Visit  Medication Sig   Alendronate Sodium 70 MG TBEF Take 1 tablet by mouth once a week.   aspirin 81 MG EC tablet Take 81 mg by mouth 2 (two) times a week. Swallow whole.   benzonatate (TESSALON PERLES) 100 MG capsule Take 1 capsule (100 mg total) by mouth 3 (three) times daily as needed for cough.   Cholecalciferol (VITAMIN D3) 50 MCG (2000 UT) capsule Take 2,000 Units by mouth daily.   Coenzyme Q10 10 MG capsule Take 1 capsule by mouth daily.   cycloSPORINE (RESTASIS) 0.05 % ophthalmic emulsion Apply 1 drop to eye in the morning and at bedtime.   fluticasone (FLONASE) 50 MCG/ACT nasal spray Place 2 sprays into both nostrils daily.   Lysine 600 MG TABS Take 1 tablet by mouth every other day.   Magnesium 250 MG TABS Take 2 tablets by mouth daily.    melatonin 1 MG TABS tablet Take 1 mg by mouth at bedtime as needed.   Multiple Vitamins-Minerals (OCUVITE-LUTEIN PO) Take 0.5 tablets by mouth daily.   NON FORMULARY Take 3 capsules by mouth daily at 6 (six) AM. Jarro's Bone Up   Omega-3 Fatty Acids (FISH OIL) 600 MG CAPS Take 600 mg by mouth daily.   Pediatric Multivit-Minerals-C (ANIMAL CHEWABLE MULTI COMPLETE PO) Take 1 tablet by mouth daily.   PHOSPHATIDYLSERINE PO daily.   Polyethylene Glycol 3350 (MIRALAX PO) Take 17 g by mouth daily.    Polyethylene Glycol 400 (BLINK TEARS OP) Apply 1 drop to eye in the morning and at bedtime.   senna-docusate (SENOKOT-S) 8.6-50 MG  tablet Take 2 tablets by mouth every evening.    tretinoin (RETIN-A) 0.05 % cream Apply topically at bedtime. Qhs to face for milia   triamcinolone cream (KENALOG) 0.1 % Apply 1 application topically 2 (two) times daily. Bid aa itchy rash on right posterior shoulder until clear, then prn flares, avoid face, groin, axilla   Turmeric 450 MG CAPS Take 550 mg by mouth every other day.   vitamin B-12 (CYANOCOBALAMIN) 500 MCG tablet Take 500 mcg by mouth daily. Quick dissolve (cherry)   No facility-administered medications prior to visit.    Review of Systems  {Labs  Heme  Chem  Endocrine  Serology  Results Review (optional):23779}   Objective    There were no vitals taken for this visit. {Show previous vital signs (optional):23777}  Physical Exam  ***  No results found for any visits on 07/27/22.  Assessment & Plan     ***  No follow-ups on file.      {provider attestation***:1}   Barbara Kirschner, Barbara Gates  [facility] [phone] (phone) [phone] (fax)  [place]

## 2022-07-27 ENCOUNTER — Ambulatory Visit: Payer: Medicare Other | Admitting: Physician Assistant

## 2022-07-27 ENCOUNTER — Encounter: Payer: Self-pay | Admitting: Physician Assistant

## 2022-07-27 VITALS — BP 146/76 | HR 56 | Ht 68.5 in | Wt 135.2 lb

## 2022-07-27 DIAGNOSIS — E785 Hyperlipidemia, unspecified: Secondary | ICD-10-CM

## 2022-07-27 DIAGNOSIS — G20 Parkinson's disease: Secondary | ICD-10-CM | POA: Diagnosis not present

## 2022-07-27 DIAGNOSIS — M818 Other osteoporosis without current pathological fracture: Secondary | ICD-10-CM

## 2022-07-27 NOTE — Assessment & Plan Note (Addendum)
Being followed by cardiology and a lipid clinic Pt did take repatha for a few months but had side effects, has not had lipid recheck since d/c but has scheduled appt The 10-year ASCVD risk score (Arnett DK, et al., 2019) is: 11.4%

## 2022-07-27 NOTE — Assessment & Plan Note (Signed)
Stable without rx medication. Takes several supplements. Followed with dr Manuella Ghazi

## 2022-07-27 NOTE — Assessment & Plan Note (Signed)
Takes fosamax and calcium managed by endocrinology

## 2022-08-12 NOTE — Progress Notes (Unsigned)
Cardiology Office Note    Date:  08/17/2022   ID:  Barbara Gates, DOB 05-17-1951, MRN [id number]  PCP:  Mikey Kirschner, PA-C  Cardiologist:  Nelva Bush, MD  Electrophysiologist:  None   Chief Complaint: Follow-up  History of Present Illness:   Barbara Gates is a 71 y.o. female with history of mild nonobstructive CAD, Parkinson's disease, HLD intolerant to statins and PCSK9 inhibitor, shortness of breath with chronic cough, GERD, pulmonary nodules, and endometriosis who presents for follow-up of her nonobstructive CAD.  She was evaluated in 2021 for shortness of breath.  Prior PFTs in 2020 were unremarkable.  Echo in 2021 demonstrated an EF of 60 to 65%, no regional wall motion abnormalities, mild LVH, grade 1 diastolic dysfunction, and no significant valvular abnormalities.  Symptoms of chronic cough were felt to be attributed to tamoxifen and raloxifene with significant improvement after discontinuation of his medications (on medication for breast CA prevention).  Subsequent GXT was intermediate risk with 1 mm of upsloping ST depression in the inferolateral leads at peak stress.  Rare PVCs were noted.  Coronary CTA in 09/2020 showed a calcium score of 76.2 placing her in the 73rd percentile.  She had mild calcified plaque in the proximal to mid LAD estimated at less than 25% stenosis.  She was last seen by her primary cardiologist in 06/2021 noting continued balance issues and was undergoing evaluation for Parkinson's disease by neurology.  Her exertional dyspnea was stable, and she was without symptoms of angina or decompensation.  She underwent statin holiday.  She has been followed by our pharmacy team for assistance with lipid management as rosuvastatin has previously been felt to possibly be complicating her Parkinson's symptoms.  She established with a different pulmonologist in 02/2022.  Subsequent D-dimer was found to be elevated.  CTA of the chest in 03/2022 was  negative for PE.  Recently diagnosed with COVID in 06/2022.  She comes in doing well from a cardiac perspective, and is without symptoms of angina or decompensation.  Her chronic dyspnea and cough are stable.  She has worked quite hard on lifestyle modifications and exercising with Chartered loss adjuster.  With this, she reports an approximate 10 pound weight loss (down 8 pounds by our scale today when compared to her visit in 06/2021).  She is very pleased with her progress.  No longer on aspirin, statin, or PCSK9 inhibitor and would prefer to defer these moving forward.   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 Fransisca Connors   Hypercalcemia    Hyperlipidemia    Has history of this   Motion sickness    Nodule of vagina    Osteoporosis    Osteoporosis    Parkinson's disease ([place]) 03/2021   Redundant colon    SOB (shortness of breath)     Past Surgical History:  Procedure Laterality Date   ABDOMINAL HYSTERECTOMY  1994   Total including ovarie. Due to endometriosis   APPENDECTOMY     with hysterectomy    BREAST BIOPSY Left 11/08/2012  Dr. Bary Castilla, neg   CATARACT EXTRACTION W/PHACO Right 05/27/2020   Procedure: CATARACT EXTRACTION PHACO AND INTRAOCULAR LENS PLACEMENT ([place]) RIGHT PANOPTIC LENS;  Surgeon: Leandrew Koyanagi, MD;  Location: [place];  Service: Ophthalmology;  Laterality: Right;  2.14 0:43.8 4.8%   CATARACT EXTRACTION W/PHACO Left 07/22/2020   Procedure: CATARACT EXTRACTION PHACO AND INTRAOCULAR LENS PLACEMENT ([facility]) LEFT PANOPTIX LENS 5.31 00:49.1 10.8%;  Surgeon: Leandrew Koyanagi,  MD;  Location: [place];  Service: Ophthalmology;  Laterality: Left;   granular cell tumor removed     OTHER SURGICAL HISTORY     Surgical Lipoma Removal; 12/13/2012  12/13/2012   VULVAR LESION REMOVAL Left 08/22/2018   Procedure: Excision left vulvar mass, possible sentinel node mapping with blue dye, possible left inguinal lymph node biopsy or dissection;  Surgeon: Mellody Drown, MD;  Location: [facility];  Service: Gynecology;  Laterality: Left;    Current Medications: Current Meds  Medication Sig   Alendronate Sodium 70 MG TBEF Take 1 tablet by mouth once a week.   b complex vitamins capsule Take 1 capsule by mouth daily.   Cholecalciferol (VITAMIN D3) 50 MCG (2000 UT) capsule Take 2,000 Units by mouth daily.   Coenzyme Q10 10 MG capsule Take 1 capsule by mouth daily.   Lifitegrast (XIIDRA OP) Apply to eye.   Lysine 600 MG TABS Take 1 tablet by mouth every other day.   Magnesium 250 MG TABS Take 2 tablets by mouth daily.    Multiple Vitamins-Minerals (OCUVITE-LUTEIN PO) Take 0.5 tablets by mouth daily.   Omega-3 Fatty Acids (FISH OIL) 600 MG CAPS Take 600 mg by mouth daily.   Pediatric Multivit-Minerals-C (ANIMAL CHEWABLE MULTI COMPLETE PO) Take 1 tablet by mouth daily.   PHOSPHATIDYLSERINE PO daily.   Polyethylene Glycol 400 (BLINK TEARS OP) Apply 1 drop to eye in the morning and at bedtime.   senna-docusate (SENOKOT-S) 8.6-50 MG tablet Take 2 tablets by mouth every evening.    tretinoin (RETIN-A) 0.05 % cream Apply topically at bedtime. Qhs to face for milia   triamcinolone cream (KENALOG) 0.1 % Apply 1 application topically 2 (two) times daily. Bid aa itchy rash on right posterior shoulder until clear, then prn flares, avoid face, groin, axilla   Turmeric 450 MG CAPS Take 550 mg by mouth every other day.    Allergies:   Other, Poison ivy extract, Raloxifene hcl, Systane [polyethyl glycol-propyl glycol], Tamoxifen, Wasp venom protein, Xiidra [lifitegrast], and Mango  flavor   Social History   Socioeconomic History   Marital status: Married    Spouse name: Not on file   Number of children: 0   Years of education: Not on file   Highest education level: Bachelor's degree (e.g., BA, AB, BS)  Occupational History   Occupation: retired  Tobacco Use   Smoking status: Never   Smokeless tobacco: Never  Vaping Use   Vaping Use: Never used  Substance and Sexual Activity   Alcohol use: Yes    Alcohol/week: 2.0 standard drinks of alcohol    Types: 1 Glasses of wine, 1 Standard drinks or equivalent per week    Comment: 1 glass of wine on Fridays and 1/2 margarita on Sat   Drug use: No   Sexual activity: Not Currently    Birth control/protection: Surgical    Comment: Hysterecetomy  Other Topics Concern   Not on file  Social History Narrative   Not on file   Social Determinants of Health   Financial Resource Strain: Low Risk  (  11/04/2020)   Overall Financial Resource Strain (CARDIA)    Difficulty of Paying Living Expenses: Not hard at all  Food Insecurity: No Food Insecurity (11/04/2020)   Hunger Vital Sign    Worried About Running Out of Food in the Last Year: Never true    Ran Out of Food in the Last Year: Never true  Transportation Needs: No Transportation Needs (11/04/2020)   PRAPARE - Hydrologist (Medical): No    Lack of Transportation (Non-Medical): No  Physical Activity: Sufficiently Active (11/04/2020)   Exercise Vital Sign    Days of Exercise per Week: 5 days    Minutes of Exercise per Session: 40 min  Stress: No Stress Concern Present (11/04/2020)   [place]    Feeling of Stress : Not at all  Social Connections: Moderately Integrated (11/04/2020)   Social Connection and Isolation Panel [NHANES]    Frequency of Communication with Friends and Family: More than three times a week    Frequency of Social Gatherings with Friends and Family:  More than three times a week    Attends Religious Services: Never    Marine scientist or Organizations: Yes    Attends Music therapist: More than 4 times per year    Marital Status: Married     Family History:  The patient's family history includes Arthritis in her sister; Breast cancer (age of onset: 8) in her sister; CAD in her father; Cancer in her mother; Diabetes in her brother, father, and mother; Dupuytren's contracture in her sister; Hyperlipidemia in her father and mother; Hypertension in her brother, father, mother, and sister; Parkinson's disease in her father; Vascular Disease in her father.  ROS:   12-point review of systems is negative unless otherwise noted in the HPI.   EKGs/Labs/Other Studies Reviewed:    Studies reviewed were summarized above. The additional studies were reviewed today:  Coronary CTA 09/30/2020: Aorta:  Normal size.  No calcifications.  No dissection.   Aortic Valve:  Trileaflet.  No calcifications.   Coronary Arteries:  Normal coronary origin.  Right dominance.   RCA is a large dominant artery that gives rise to PDA and PLA. There is no plaque.   Left main is a large artery that gives rise to LAD and LCX arteries.   LAD is a large vessel that has mild calcified plaque in the proximal to mid segment causing minimal stenosis (<25%).   LCX is a non-dominant artery that gives rise to two obtuse marginal branches. There is no plaque.   Other findings:   Normal pulmonary vein drainage into the left atrium.   Normal left atrial appendage without a thrombus.   Normal size of the pulmonary artery.   IMPRESSION: 1. Coronary calcium score of 76.2. This was 73rd percentile for age and sex matched control. 2. Normal coronary origin with right dominance. 3. Mild calcified plaque in the proximal to mid LAD causing minimal stenosis (<25%) 4. CAD-RADS 1. Minimal non-obstructive CAD (0-24%). Consider non-atherosclerotic causes  of chest pain. Consider preventive therapy and risk factor modification. __________  ETT 09/04/2020: Baseline EKG demonstrates normal sinus rhythm without significant abnormalities. The patient exhibited fair exercise capacy. She did not experience angina during stress. Heart rate and blood pressure responses were normal. There is 1 mm of upsloping ST depression in the inferolateral leads at peak stress. Rare PVC's were observed during stress. No significant arrhythmia occurred. Duke treadmill score = +  2 (intermediate risk)   Mild inferolateral ST depression noted at peak stress with associated shortness of breath.  This is an intermediate risk stress test. __________  2D echo 01/07/2020: 1. Left ventricular ejection fraction, by estimation, is 60 to 65%. The  left ventricle has normal function. The left ventricle has no regional  wall motion abnormalities. There is mild concentric left ventricular  hypertrophy. Left ventricular diastolic  parameters are consistent with Grade I diastolic dysfunction (impaired  relaxation).   2. Right ventricular systolic function is normal. The right ventricular  size is normal.   3. The mitral valve is normal in structure and function. No evidence of  mitral valve regurgitation. No evidence of mitral stenosis.   4. The aortic valve is normal in structure and function. Aortic valve  regurgitation is not visualized. No aortic stenosis is present.   5. The inferior vena cava is normal in size with greater than 50%  respiratory variability, suggesting right atrial pressure of 3 mmHg.   EKG:  EKG is not ordered today.  The EKG ordered 07/19/2022 demonstrated NSR, 70 bpm, no acute ST-T changes  Recent Labs: 07/19/2022: BUN 9; Creatinine, Ser 0.84; Hemoglobin 13.1; Platelets 277; Potassium 4.4; Sodium 139  Recent Lipid Panel    Component Value Date/Time   CHOL 210 (H) 10/04/2021 0845   TRIG 74 10/04/2021 0845   HDL 81 10/04/2021 0845   CHOLHDL 2.6  10/04/2021 0845   CHOLHDL 2.0 01/05/2021 0841   VLDL 12 01/05/2021 0841   LDLCALC 116 (H) 10/04/2021 0845   LDLCALC 87 10/19/2017 1012    PHYSICAL EXAM:    VS:  BP 124/70 (BP Location: Left Arm, Patient Position: Sitting, Cuff Size: Normal)   Pulse (!) 58   Ht '5\' 8"'$  (1.727 m)   Wt 133 lb 9.6 oz (60.6 kg)   SpO2 100%   BMI 20.31 kg/m   BMI: Body mass index is 20.31 kg/m.  Physical Exam Vitals reviewed.  Constitutional:      Appearance: She is well-developed.  HENT:     Head: Normocephalic and atraumatic.  Eyes:     General:        Right eye: No discharge.        Left eye: No discharge.  Neck:     Vascular: No JVD.  Cardiovascular:     Rate and Rhythm: Normal rate and regular rhythm.     Heart sounds: Normal heart sounds, S1 normal and S2 normal. Heart sounds not distant. No midsystolic click and no opening snap. No murmur heard.    No friction rub.  Pulmonary:     Effort: Pulmonary effort is normal. No respiratory distress.     Breath sounds: Normal breath sounds. No decreased breath sounds, wheezing or rales.  Chest:     Chest wall: No tenderness.  Abdominal:     General: There is no distension.  Musculoskeletal:     Cervical back: Normal range of motion.     Right lower leg: No edema.     Left lower leg: No edema.  Skin:    General: Skin is warm and dry.     Nails: There is no clubbing.  Neurological:     Mental Status: She is alert and oriented to person, place, and time.  Psychiatric:        Speech: Speech normal.        Behavior: Behavior normal.        Thought Content: Thought content normal.  Judgment: Judgment normal.     Wt Readings from Last 3 Encounters:  08/17/22 133 lb 9.6 oz (60.6 kg)  07/27/22 135 lb 3.2 oz (61.3 kg)  07/19/22 133 lb (60.3 kg)     ASSESSMENT & PLAN:   Nonobstructive CAD: She is doing well and is without symptoms concerning for angina or decompensation.  Her chronic dyspnea/cough are stable.  Continue aggressive  risk factor modification and primary prevention.  No longer on aspirin, statin, or PCSK9 inhibitor.  She would prefer to avoid these moving forward if possible.  She has worked quite hard on lifestyle modification.  Check fasting lipid panel.  HLD: LDL 39 in 01/2022.  Intolerant to statins and PCSK9 inhibitor.  Check fasting lipid panel and LP(a) at her request.  Chronic cough: Follow-up with pulmonology as directed.      Disposition: F/u with Dr. Saunders Revel or an APP in 12 months.   Medication Adjustments/Labs and Tests Ordered: Current medicines are reviewed at length with the patient today.  Concerns regarding medicines are outlined above. Medication changes, Labs and Tests ordered today are summarized above and listed in the Patient Instructions accessible in Encounters.   Signed, Christell Faith, PA-C 08/17/2022 9:20 AM     [address], [place] [address] [phone]

## 2022-08-17 ENCOUNTER — Other Ambulatory Visit
Admission: RE | Admit: 2022-08-17 | Discharge: 2022-08-17 | Disposition: A | Discharge status: Home / Self Care | Payer: Medicare Other | Attending: Physician Assistant | Admitting: Physician Assistant

## 2022-08-17 ENCOUNTER — Encounter: Payer: Self-pay | Admitting: Physician Assistant

## 2022-08-17 ENCOUNTER — Ambulatory Visit: Payer: Medicare Other | Attending: Physician Assistant | Admitting: Physician Assistant

## 2022-08-17 VITALS — BP 124/70 | HR 58 | Ht 68.0 in | Wt 133.6 lb

## 2022-08-17 DIAGNOSIS — I2510 Atherosclerotic heart disease of native coronary artery without angina pectoris: Secondary | ICD-10-CM | POA: Insufficient documentation

## 2022-08-17 DIAGNOSIS — R0609 Other forms of dyspnea: Secondary | ICD-10-CM

## 2022-08-17 DIAGNOSIS — R053 Chronic cough: Secondary | ICD-10-CM

## 2022-08-17 DIAGNOSIS — E785 Hyperlipidemia, unspecified: Secondary | ICD-10-CM | POA: Diagnosis not present

## 2022-08-17 LAB — LIPID PANEL
Cholesterol: 211 mg/dL — ABNORMAL HIGH (ref 0–200)
HDL: 80 mg/dL (ref >40)
LDL Cholesterol: 120 mg/dL — ABNORMAL HIGH (ref 0–99)
Total CHOL/HDL Ratio: 2.6 RATIO
Triglycerides: 55 mg/dL (ref <150)
VLDL: 11 mg/dL (ref 0–40)

## 2022-08-17 NOTE — Patient Instructions (Signed)
Medication Instructions:  No changes at this time.   *If you need a refill on your cardiac medications before your next appointment, please call your pharmacy*   Lab Work: Lipid panel and LPa to be done over at the [facility] entrance and stop at registration desk.   If you have labs (blood work) drawn today and your tests are completely normal, you will receive your results only by: [place] (if you have MyChart) OR A paper copy in the mail If you have any lab test that is abnormal or we need to change your treatment, we will call you to review the results.   Testing/Procedures: None   Follow-Up: At [facility], you and your health needs are our priority.  As part of our continuing mission to provide you with exceptional heart care, we have created designated Provider Care Teams.  These Care Teams include your primary Cardiologist (physician) and Advanced Practice Providers (APPs -  Physician Assistants and Nurse Practitioners) who all work together to provide you with the care you need, when you need it.   Your next appointment:   1 year(s)  The format for your next appointment:   In Person  Provider:   Nelva Bush, MD or Christell Faith, PA-C         Important Information About Sugar

## 2022-08-18 LAB — LIPOPROTEIN A (LPA): Lipoprotein (a): 98.9 nmol/L — ABNORMAL HIGH (ref <75.0)

## 2022-08-23 ENCOUNTER — Ambulatory Visit: Payer: Medicare Other | Admitting: Dermatology

## 2022-08-23 DIAGNOSIS — L578 Other skin changes due to chronic exposure to nonionizing radiation: Secondary | ICD-10-CM | POA: Diagnosis not present

## 2022-08-23 DIAGNOSIS — S80862A Insect bite (nonvenomous), left lower leg, initial encounter: Secondary | ICD-10-CM

## 2022-08-23 DIAGNOSIS — D1801 Hemangioma of skin and subcutaneous tissue: Secondary | ICD-10-CM

## 2022-08-23 DIAGNOSIS — L821 Other seborrheic keratosis: Secondary | ICD-10-CM

## 2022-08-23 DIAGNOSIS — Z1283 Encounter for screening for malignant neoplasm of skin: Secondary | ICD-10-CM | POA: Diagnosis not present

## 2022-08-23 DIAGNOSIS — S80861A Insect bite (nonvenomous), right lower leg, initial encounter: Secondary | ICD-10-CM | POA: Diagnosis not present

## 2022-08-23 DIAGNOSIS — L720 Epidermal cyst: Secondary | ICD-10-CM

## 2022-08-23 DIAGNOSIS — D225 Melanocytic nevi of trunk: Secondary | ICD-10-CM | POA: Diagnosis not present

## 2022-08-23 DIAGNOSIS — D485 Neoplasm of uncertain behavior of skin: Secondary | ICD-10-CM

## 2022-08-23 DIAGNOSIS — W57XXXA Bitten or stung by nonvenomous insect and other nonvenomous arthropods, initial encounter: Secondary | ICD-10-CM

## 2022-08-23 DIAGNOSIS — L814 Other melanin hyperpigmentation: Secondary | ICD-10-CM

## 2022-08-23 DIAGNOSIS — D229 Melanocytic nevi, unspecified: Secondary | ICD-10-CM

## 2022-08-23 MED ORDER — TRETINOIN 0.05 % EX CREA: TOPICAL_CREAM | Freq: Every day | CUTANEOUS | 3 refills | Status: DC

## 2022-08-23 NOTE — Progress Notes (Signed)
Follow-Up Visit   Subjective  Barbara Gates is a 71 y.o. female who presents for the following: Annual Exam.  The patient presents for Total-Body Skin Exam (TBSE) for skin cancer screening and mole check.  The patient has spots, moles and lesions to be evaluated, some may be new or changing, including left forearm and an itchy spot on her toe. She has milia of the face, which have improved using tretinoin 0.05% cream. She has a history of dysplastic nevus of the right lateral buttocks.    The following portions of the chart were reviewed this encounter and updated as appropriate:       Review of Systems:  No other skin or systemic complaints except as noted in HPI or Assessment and Plan.  Objective  Well appearing patient in no apparent distress; mood and affect are within normal limits.  A full examination was performed including scalp, head, eyes, ears, nose, lips, neck, chest, axillae, abdomen, back, buttocks, bilateral upper extremities, bilateral lower extremities, hands, feet, fingers, toes, fingernails, and toenails. All findings within normal limits unless otherwise noted below.  right lower flank; central lower abdomen Right lower flank 4.0 x 2.12m med dark brown macule   central lower abdomen 5.0 x 4.058mspeckled brown macule    left central upper abdomen 5 x 4 mm two-tone brown macule     right central upper abdomen 6 x 4 mm speckled brown macule       lower legs Pink papules    Assessment & Plan  Skin cancer screening performed today.  Actinic Damage - chronic, secondary to cumulative UV radiation exposure/sun exposure over time - diffuse scaly erythematous macules with underlying dyspigmentation - Recommend daily broad spectrum sunscreen SPF 30+ to sun-exposed areas, reapply every 2 hours as needed.  - Recommend staying in the shade or wearing long sleeves, sun glasses (UVA+UVB protection) and wide brim hats (4-inch brim around the entire  circumference of the hat). - Call for new or changing lesions.  Lentigines - Scattered tan macules - Due to sun exposure - Benign-appearing, observe - Recommend daily broad spectrum sunscreen SPF 30+ to sun-exposed areas, reapply every 2 hours as needed. - Call for any changes  Seborrheic Keratoses - Stuck-on, waxy, tan-brown papules and/or plaques, including left forearm  - Benign-appearing - Discussed benign etiology and prognosis. - Observe - Call for any changes  History of Dysplastic Nevus - No evidence of recurrence today of the right lateral buttocks - Recommend regular full body skin exams - Recommend daily broad spectrum sunscreen SPF 30+ to sun-exposed areas, reapply every 2 hours as needed.  - Call if any new or changing lesions are noted between office visits  Milia - tiny firm white papules - type of cyst - benign - may be extracted if symptomatic - observe - continue tretinoin 0.05% cream qhs as tolerated Topical retinoid medications like tretinoin/Retin-A, adapalene/Differin, tazarotene/Fabior, and Epiduo/Epiduo Forte can cause dryness and irritation when first started. Only apply a pea-sized amount to the entire affected area. Avoid applying it around the eyes, edges of mouth and creases at the nose. If you experience irritation, use a good moisturizer first and/or apply the medicine less often. If you are doing well with the medicine, you can increase how often you use it until you are applying every night. Be careful with sun protection while using this medication as it can make you sensitive to the sun. This medicine should not be used by pregnant women.  Nevus right lower flank; central lower abdomen  Benign-appearing. Stable compared to previous visit. Observation.  Call clinic for new or changing moles.  Recommend daily use of broad spectrum spf 30+ sunscreen to sun-exposed areas.    Neoplasm of uncertain behavior of skin (2) left central upper  abdomen  Epidermal / dermal shaving  Lesion diameter (cm):  0.7 Informed consent: discussed and consent obtained   Patient was prepped and draped in usual sterile fashion: Area prepped with alcohol. Anesthesia: the lesion was anesthetized in a standard fashion   Anesthetic:  1% lidocaine w/ epinephrine 1-100,000 buffered w/ 8.4% NaHCO3 Instrument used: flexible razor blade   Hemostasis achieved with: pressure, aluminum chloride and electrodesiccation   Outcome: patient tolerated procedure well   Post-procedure details: wound care instructions given   Post-procedure details comment:  Ointment and small bandage applied  Specimen 1 - Surgical pathology Differential Diagnosis: Nevus vs Dysplastic Nevus Check Margins: No  right central upper abdomen  Epidermal / dermal shaving  Lesion diameter (cm):  0.7 Informed consent: discussed and consent obtained   Patient was prepped and draped in usual sterile fashion: Area prepped with alcohol. Anesthesia: the lesion was anesthetized in a standard fashion   Anesthetic:  1% lidocaine w/ epinephrine 1-100,000 buffered w/ 8.4% NaHCO3 Instrument used: flexible razor blade   Hemostasis achieved with: pressure, aluminum chloride and electrodesiccation   Outcome: patient tolerated procedure well   Post-procedure details: wound care instructions given   Post-procedure details comment:  Ointment and small bandage applied  Specimen 2 - Surgical pathology Differential Diagnosis: Nevus vs Dysplastic nevus Check Margins: No  Bug bite without infection, initial encounter lower legs  Resolving  Benign, observe.     Milia  Related Medications tretinoin (RETIN-A) 0.05 % cream Apply topically at bedtime. Qhs to face for milia   Return in about 1 year (around 08/24/2023) for TBSE.  IJamesetta Orleans, CMA, am acting as scribe for Brendolyn Patty, MD .  Documentation: I have reviewed the above documentation for accuracy and completeness, and I  agree with the above.  Brendolyn Patty MD

## 2022-08-23 NOTE — Patient Instructions (Addendum)
Wound Care Instructions  Cleanse wound gently with soap and water once a day then pat dry with clean gauze. Apply a thin coat of Petrolatum (petroleum jelly, "Vaseline") over the wound (unless you have an allergy to this). We recommend that you use a new, sterile tube of Vaseline. Do not pick or remove scabs. Do not remove the yellow or white "healing tissue" from the base of the wound.  Cover the wound with fresh, clean, nonstick gauze and secure with paper tape. You may use Band-Aids in place of gauze and tape if the wound is small enough, but would recommend trimming much of the tape off as there is often too much. Sometimes Band-Aids can irritate the skin.  You should call the office for your biopsy report after 1 week if you have not already been contacted.  If you experience any problems, such as abnormal amounts of bleeding, swelling, significant bruising, significant pain, or evidence of infection, please call the office immediately.  FOR ADULT SURGERY PATIENTS: If you need something for pain relief you may take 1 extra strength Tylenol (acetaminophen) AND 2 Ibuprofen ('200mg'$  each) together every 4 hours as needed for pain. (do not take these if you are allergic to them or if you have a reason you should not take them.) Typically, you may only need pain medication for 1 to 3 days.      Melanoma ABCDEs  Melanoma is the most dangerous type of skin cancer, and is the leading cause of death from skin disease.  You are more likely to develop melanoma if you: Have light-colored skin, light-colored eyes, or red or blond hair Spend a lot of time in the sun Tan regularly, either outdoors or in a tanning bed Have had blistering sunburns, especially during childhood Have a close family member who has had a melanoma Have atypical moles or large birthmarks  Early detection of melanoma is key since treatment is typically straightforward and cure rates are extremely high if we catch it early.    The first sign of melanoma is often a change in a mole or a new dark spot.  The ABCDE system is a way of remembering the signs of melanoma.  A for asymmetry:  The two halves do not match. B for border:  The edges of the growth are irregular. C for color:  A mixture of colors are present instead of an even brown color. D for diameter:  Melanomas are usually (but not always) greater than 87m - the size of a pencil eraser. E for evolution:  The spot keeps changing in size, shape, and color.  Please check your skin once per month between visits. You can use a small mirror in front and a large mirror behind you to keep an eye on the back side or your body.   If you see any new or changing lesions before your next follow-up, please call to schedule a visit.  Please continue daily skin protection including broad spectrum sunscreen SPF 30+ to sun-exposed areas, reapplying every 2 hours as needed when you're outdoors.   Staying in the shade or wearing long sleeves, sun glasses (UVA+UVB protection) and wide brim hats (4-inch brim around the entire circumference of the hat) are also recommended for sun protection.    Topical retinoid medications like tretinoin/Retin-A, adapalene/Differin, tazarotene/Fabior, and Epiduo/Epiduo Forte can cause dryness and irritation when first started. Only apply a pea-sized amount to the entire affected area. Avoid applying it around the eyes, edges of mouth  and creases at the nose. If you experience irritation, use a good moisturizer first and/or apply the medicine less often. If you are doing well with the medicine, you can increase how often you use it until you are applying every night. Be careful with sun protection while using this medication as it can make you sensitive to the sun. This medicine should not be used by pregnant women.   Due to recent changes in healthcare laws, you may see results of your pathology and/or laboratory studies on MyChart before the  doctors have had a chance to review them. We understand that in some cases there may be results that are confusing or concerning to you. Please understand that not all results are received at the same time and often the doctors may need to interpret multiple results in order to provide you with the best plan of care or course of treatment. Therefore, we ask that you please give [place] 2 business days to thoroughly review all your results before contacting the office for clarification. Should we see a critical lab result, you will be contacted sooner.   If You Need Anything After Your Visit  If you have any questions or concerns for your doctor, please call our main line at [phone] and press option 4 to reach your doctor's medical assistant. If no one answers, please leave a voicemail as directed and we will return your call as soon as possible. Messages left after 4 pm will be answered the following business day.   You may also send [place] a message via [place]. We typically respond to MyChart messages within 1-2 business days.  For prescription refills, please ask your pharmacy to contact our office. Our fax number is [phone].  If you have an urgent issue when the clinic is closed that cannot wait until the next business day, you can page your doctor at the number below.    Please note that while we do our best to be available for urgent issues outside of office hours, we are not available 24/7.   If you have an urgent issue and are unable to reach [place], you may choose to seek medical care at your doctor's office, retail clinic, urgent care center, or emergency room.  If you have a medical emergency, please immediately call 911 or go to the emergency department.  Pager Numbers  - Dr. Nehemiah Massed: [phone]  - Dr. Laurence Ferrari: [phone]  - Dr. Nicole Kindred: [phone]  In the event of inclement weather, please call our main line at [phone] for an update on the status of any delays or  closures.  Dermatology Medication Tips: Please keep the boxes that topical medications come in in order to help keep track of the instructions about where and how to use these. Pharmacies typically print the medication instructions only on the boxes and not directly on the medication tubes.   If your medication is too expensive, please contact our office at [phone] option 4 or send [place] a message through [place].   We are unable to tell what your co-pay for medications will be in advance as this is different depending on your insurance coverage. However, we may be able to find a substitute medication at lower cost or fill out paperwork to get insurance to cover a needed medication.   If a prior authorization is required to get your medication covered by your insurance company, please allow [place] 1-2 business days to complete this process.  Drug prices often vary depending on where  the prescription is filled and some pharmacies may offer cheaper prices.  The website www.goodrx.com contains coupons for medications through different pharmacies. The prices here do not account for what the cost may be with help from insurance (it may be cheaper with your insurance), but the website can give you the price if you did not use any insurance.  - You can print the associated coupon and take it with your prescription to the pharmacy.  - You may also stop by our office during regular business hours and pick up a GoodRx coupon card.  - If you need your prescription sent electronically to a different pharmacy, notify our office through [facility] or by phone at [phone] option 4.     Si Usted Necesita Algo Despus de Su Visita  Tambin puede enviarnos un mensaje a travs de Pharmacist, community. Por lo general respondemos a los mensajes de MyChart en el transcurso de 1 a 2 das hbiles.  Para renovar recetas, por favor pida a su farmacia que se ponga en contacto con nuestra oficina. Harland Dingwall de fax  es [place] [phone].  Si tiene un asunto urgente cuando la clnica est cerrada y que no puede esperar hasta el siguiente da hbil, puede llamar/localizar a su doctor(a) al nmero que aparece a continuacin.   Por favor, tenga en cuenta que aunque hacemos todo lo posible para estar disponibles para asuntos urgentes fuera del horario de [place], no estamos disponibles las 24 horas del da, los 7 das de la [place].   Si tiene un problema urgente y no puede comunicarse con nosotros, puede optar por buscar atencin mdica  en el consultorio de su doctor(a), en una clnica privada, en un centro de atencin urgente o en una sala de emergencias.  Si tiene Engineering geologist, por favor llame inmediatamente al 911 o vaya a la sala de emergencias.  Nmeros de bper  - Dr. Nehemiah Massed: [phone]  - Dra. Moye: [phone]  - Dra. Nicole Kindred: [phone]  En caso de inclemencias del [place], por favor llame a Johnsie Kindred principal al [phone] para una actualizacin sobre el [place] de cualquier retraso o cierre.  Consejos para la medicacin en dermatologa: Por favor, guarde las cajas en las que vienen los medicamentos de uso tpico para ayudarle a seguir las instrucciones sobre dnde y cmo usarlos. Las farmacias generalmente imprimen las instrucciones del medicamento slo en las cajas y no directamente en los tubos del [place].   Si su medicamento es muy caro, por favor, pngase en contacto con Zigmund Daniel llamando al [phone] y presione la opcin 4 o envenos un mensaje a travs de Pharmacist, community.   No podemos decirle cul ser su copago por los medicamentos por adelantado ya que esto es diferente dependiendo de la cobertura de su seguro. Sin embargo, es posible que podamos encontrar un medicamento sustituto a Electrical engineer un formulario para que el seguro cubra el medicamento que se considera necesario.   Si se requiere una autorizacin previa para que su compaa de seguros [place]  su medicamento, por favor permtanos de 1 a 2 das hbiles para completar este proceso.  Los precios de los medicamentos varan con frecuencia dependiendo del Environmental consultant de dnde se surte la receta y alguna farmacias pueden ofrecer precios ms baratos.  El sitio web www.goodrx.com tiene cupones para medicamentos de Airline pilot. Los precios aqu no tienen en cuenta lo que podra costar con la ayuda del seguro (puede ser ms barato con su seguro), pero el sitio  web puede darle el precio si no Field seismologist.  - Puede imprimir el cupn correspondiente y llevarlo con su receta a la farmacia.  - Tambin puede pasar por nuestra oficina durante el horario de atencin regular y Charity fundraiser una tarjeta de cupones de GoodRx.  - Si necesita que su receta se enve electrnicamente a una farmacia diferente, informe a nuestra oficina a travs de MyChart de [place] o por telfono llamando al [phone] y presione la opcin 4.

## 2022-08-25 ENCOUNTER — Ambulatory Visit (INDEPENDENT_AMBULATORY_CARE_PROVIDER_SITE_OTHER): Payer: Medicare Other | Admitting: Physician Assistant

## 2022-08-25 ENCOUNTER — Encounter: Payer: Self-pay | Admitting: Physician Assistant

## 2022-08-25 VITALS — BP 125/73 | HR 58 | Wt 133.8 lb

## 2022-08-25 DIAGNOSIS — K5909 Other constipation: Secondary | ICD-10-CM

## 2022-08-25 DIAGNOSIS — Z0000 Encounter for general adult medical examination without abnormal findings: Secondary | ICD-10-CM

## 2022-08-25 DIAGNOSIS — G20C Parkinsonism, unspecified: Secondary | ICD-10-CM

## 2022-08-25 DIAGNOSIS — G4700 Insomnia, unspecified: Secondary | ICD-10-CM | POA: Diagnosis not present

## 2022-08-25 DIAGNOSIS — Z23 Encounter for immunization: Secondary | ICD-10-CM

## 2022-08-25 NOTE — Assessment & Plan Note (Signed)
Advised potential dependence on senna. If fiber isnt an option, increase fluids.  Discussed she may have ibs- c, would prefer she discuss with GI, there are some medication options

## 2022-08-25 NOTE — Assessment & Plan Note (Signed)
Previously stable, advised if she is concerned to make an appt with Dr Manuella Ghazi.

## 2022-08-25 NOTE — Assessment & Plan Note (Signed)
Advised on sleep hygiene.  Mentioned she could add 120 mg of mag glycinate nightly but caution with amounts of magnesium daily when combined w/ other mag for constipation.  Recommended daily allowance is 400-420 mg.

## 2022-08-25 NOTE — Progress Notes (Signed)
I,Barbara Gates,acting as a Education administrator for [employer], Barbara Gates.,have documented all relevant documentation on the behalf of Barbara Gates, Barbara Gates,as directed by  Barbara Gates, Barbara Gates while in the presence of Barbara Gates, Barbara Gates.  Annual Wellness Visit     Patient: Barbara Gates, Female    DOB: January 16, 1951, 71 y.o.   MRN: [id number] Visit Date: 08/25/2022  Today's Provider: Mikey Gates, Barbara Gates   Cc. awv  Subjective    Barbara Gates is a 71 y.o. female who presents today for her Annual Wellness Visit. She reports consuming a general and lower fat, with no rice and some potatoes  diet.  The patient reports doing silver sneakers 3 times a week for 45 minutes and rock steady boxing 3 times a week for a hour to an hour fifteen minutes.  She generally feels well. She reports sleeping fairly well. She does have additional problems to discuss today.   Constipation -Pt reports chronic constipation, uses stool softener, senna, magnesium, prunes, mirilax daily. Curious of other remedies. Pt is unable to take fiber d/t her abnormal shaped colon.   Insomnia -Pt reports she is able to fall asleep but cannot stay asleep, is usually up in the early morning and then can fall back asleep for a few hours.   Concentration issues -Pt reports increased difficulty in concentrating and remembering. She wonders if it is time to see her neurologist for Parkinson's again.  Medications: Outpatient Medications Prior to Visit  Medication Sig   Alendronate Sodium 70 MG TBEF Take 1 tablet by mouth once a week.   b complex vitamins capsule Take 1 capsule by mouth daily.   Cholecalciferol (VITAMIN D3) 50 MCG (2000 UT) capsule Take 2,000 Units by mouth daily.   Coenzyme Q10 10 MG capsule Take 1 capsule by mouth daily.   Lifitegrast (XIIDRA OP) Apply to eye.   Lysine 600 MG TABS Take 1 tablet by mouth every other day.   Magnesium 250 MG TABS Take 2 tablets by mouth daily.    Multiple  Vitamins-Minerals (OCUVITE-LUTEIN PO) Take 0.5 tablets by mouth daily.   Omega-3 Fatty Acids (FISH OIL) 600 MG CAPS Take 600 mg by mouth daily.   Pediatric Multivit-Minerals-C (ANIMAL CHEWABLE MULTI COMPLETE PO) Take 1 tablet by mouth daily.   PHOSPHATIDYLSERINE PO daily.   Polyethylene Glycol 400 (BLINK TEARS OP) Apply 1 drop to eye in the morning and at bedtime.   senna-docusate (SENOKOT-S) 8.6-50 MG tablet Take 2 tablets by mouth every evening.    tretinoin (RETIN-A) 0.05 % cream Apply topically at bedtime. Qhs to face for milia   triamcinolone cream (KENALOG) 0.1 % Apply 1 application topically 2 (two) times daily. Bid aa itchy rash on right posterior shoulder until clear, then prn flares, avoid face, groin, axilla   Turmeric 450 MG CAPS Take 550 mg by mouth every other day.   NON FORMULARY Take 3 capsules by mouth daily at 6 (six) AM. Jarro's Bone Up   No facility-administered medications prior to visit.    Allergies  Allergen Reactions   Other Hives    selamectin active ingredient in Revolution Plus for cats.   Poison Ivy Extract Itching   Raloxifene Hcl Cough        Systane [Polyethyl Glycol-Propyl Glycol] Itching   Tamoxifen Cough   Wasp Venom Protein Hives   Xiidra [Lifitegrast] Itching   Mango Flavor Itching and Rash    Patient Care Team: Barbara Gates, Barbara Gates as PCP - General (Physician Assistant) End,  Harrell Gave, MD as PCP - Cardiology (Cardiology) Brendolyn Patty, MD as Consulting Physician (Dermatology) Tyler Pita, MD as Consulting Physician (Pulmonary Disease) Clyde Canterbury, MD as Referring Physician (Otolaryngology) Leandrew Koyanagi, MD as Referring Physician (Ophthalmology) Estill Cotta, MD (Ophthalmology)  Review of Systems  Gastrointestinal:  Positive for constipation.  Endocrine: Positive for cold intolerance and heat intolerance.  Genitourinary:  Positive for urgency.  Musculoskeletal:  Positive for neck stiffness.   Psychiatric/Behavioral:  Positive for decreased concentration.       Objective    Vitals: BP 125/73 (BP Location: Left Arm, Patient Position: Sitting, Cuff Size: Normal)   Pulse (!) 58   Wt 133 lb 12.8 oz (60.7 kg)   SpO2 100%   BMI 20.34 kg/m  Blood pressure 125/73, pulse (!) 58, weight 133 lb 12.8 oz (60.7 kg), SpO2 100 %.    Physical Exam Constitutional:      General: She is awake.     Appearance: She is well-developed. She is not ill-appearing.  HENT:     Head: Normocephalic.     Right Ear: Tympanic membrane normal.     Left Ear: Tympanic membrane normal.     Nose: Nose normal. No congestion or rhinorrhea.     Mouth/Throat:     Pharynx: No oropharyngeal exudate or posterior oropharyngeal erythema.  Eyes:     Conjunctiva/sclera: Conjunctivae normal.     Pupils: Pupils are equal, round, and reactive to light.  Neck:     Thyroid: No thyroid mass or thyromegaly.  Cardiovascular:     Rate and Rhythm: Normal rate and regular rhythm.     Heart sounds: Normal heart sounds.  Pulmonary:     Effort: Pulmonary effort is normal.     Breath sounds: Normal breath sounds.  Abdominal:     Palpations: Abdomen is soft.     Tenderness: There is no abdominal tenderness.  Musculoskeletal:     Right lower leg: No swelling. No edema.     Left lower leg: No swelling. No edema.  Lymphadenopathy:     Cervical: No cervical adenopathy.  Skin:    General: Skin is warm.  Neurological:     Mental Status: She is alert and oriented to person, place, and time.  Psychiatric:        Attention and Perception: Attention normal.        Mood and Affect: Mood normal.        Speech: Speech normal.        Behavior: Behavior normal. Behavior is cooperative.     Most recent functional status assessment:    08/25/2022   11:10 AM  In your present state of health, do you have any difficulty performing the following activities:  Hearing? 1  Vision? 0  Difficulty concentrating or making decisions? 1   Walking or climbing stairs? 0  Dressing or bathing? 0  Doing errands, shopping? 0   Most recent fall risk assessment:    08/25/2022   11:09 AM  Fall Risk   Falls in the past year? 1  Number falls in past yr: 1  Injury with Fall? 1  Risk for fall due to : History of fall(s)  Follow up Falls evaluation completed    Most recent depression screenings:    08/25/2022   11:10 AM 07/27/2022   10:50 AM  PHQ 2/9 Scores  PHQ - 2 Score 0   PHQ- 9 Score 3   Exception Documentation  Other- indicate reason in comment box   Most recent  cognitive screening:    10/18/2017    2:38 PM  6CIT Screen  What Year? 0 points  What month? 0 points  What time? 0 points  Count back from 20 0 points  Months in reverse 0 points  Repeat phrase 0 points  Total Score 0 points   Most recent Audit-C alcohol use screening    08/25/2022   11:09 AM  Alcohol Use Disorder Test (AUDIT)  1. How often do you have a drink containing alcohol? 2  2. How many drinks containing alcohol do you have on a typical day when you are drinking? 0  3. How often do you have six or more drinks on one occasion? 0  AUDIT-C Score 2   A score of 3 or more in women, and 4 or more in men indicates increased risk for alcohol abuse, EXCEPT if all of the points are from question 1   No results found for any visits on 08/25/22.  Assessment & Plan     Annual wellness visit done today including the all of the following: Reviewed patient's Family Medical History Reviewed and updated list of patient's medical providers Assessed patient's functional ability Established a written schedule for health screening services Health Risk Assessent Completed and Reviewed  Exercise Activities and Dietary recommendations --balanced diet high in fiber and protein, low in sugars, carbs, fats. --physical activity/exercise 30 minutes 3-5 times a week     Immunization History  Administered Date(s) Administered   Hepatitis A, Adult 09/20/2017,  04/05/2018   Influenza Split 09/06/2011, 09/11/2012   Influenza, High Dose Seasonal PF 10/17/2016, 09/03/2018   Influenza,inj,Quad PF,6+ Mos 09/13/2013, 12/07/2015, 08/23/2017   Influenza-Unspecified 08/26/2020   PFIZER Comirnaty(Gray Top)Covid-19 Tri-Sucrose Vaccine 01/03/2020, 01/28/2020, 08/26/2020   PFIZER(Purple Top)SARS-COV-2 Vaccination 01/03/2020, 01/28/2020, 08/26/2020   Pneumococcal Conjugate-13 10/17/2016   Pneumococcal Polysaccharide-23 10/18/2017   Tdap 09/02/2010   Typhoid Inactivated 09/20/2017   Zoster Recombinat (Shingrix) 10/23/2018, 03/20/2019   Zoster, Live 02/05/2014    Health Maintenance  Topic Date Due   COVID-19 Vaccine (8 - Pfizer risk series) 10/13/2021   INFLUENZA VACCINE  06/21/2022   MAMMOGRAM  05/27/2023   DEXA SCAN  01/19/2024   COLONOSCOPY (Pts 45-82yr Insurance coverage will need to be confirmed)  03/21/2024   TETANUS/TDAP  02/12/2031   Pneumonia Vaccine 71 Years old  Completed   Hepatitis C Screening  Completed   Zoster Vaccines- Shingrix  Completed   HPV VACCINES  Aged Out     Discussed health benefits of physical activity, and encouraged her to engage in regular exercise appropriate for her age and condition.    Problem List Items Addressed This Visit       Digestive   Chronic constipation    Advised potential dependence on senna. If fiber isnt an option, increase fluids.  Discussed she may have ibs- c, would prefer she discuss with GI, there are some medication options        Nervous and Auditory   Primary parkinsonism    Previously stable, advised if she is concerned to make an appt with Dr SManuella Ghazi          Other   Insomnia    Advised on sleep hygiene.  Mentioned she could add 120 mg of mag glycinate nightly but caution with amounts of magnesium daily when combined w/ other mag for constipation.  Recommended daily allowance is 400-420 mg.       Other Visit Diagnoses     Encounter for annual wellness exam in  Medicare  patient    -  Primary   Flu vaccine need       Relevant Orders   Flu Vaccine QUAD High Dose(Fluad)      Reviewed recent labs.  Return in about 1 year (around 08/26/2023) for CPE, AVW.     I, Barbara Gates, Barbara Gates have reviewed all documentation for this visit. The documentation on  08/25/2022 for the exam, diagnosis, procedures, and orders are all accurate and complete.  Barbara Gates, Barbara Gates [facility] [address] [place], [place], [address] Office: [phone] Fax: [place]

## 2022-08-29 ENCOUNTER — Telehealth: Payer: Self-pay

## 2022-08-29 NOTE — Telephone Encounter (Signed)
Advised pt of bx results.  Scheduled pt for shave removal x 1./sh

## 2022-08-29 NOTE — Telephone Encounter (Signed)
-----   Message from Brendolyn Patty, MD sent at 08/29/2022  1:28 PM EDT ----- 1. Skin , left central upper abdomen DYSPLASTIC COMPOUND NEVUS WITH MODERATE ATYPIA, LIMITED MARGINS FREE 2. Skin , right central upper abdomen DYSPLASTIC JUNCTIONAL LENTIGINOUS NEVUS WITH MODERATE TO SEVERE ATYPIA, IRRITATED, CLOSE TO MARGIN, SEE DESCRIPTION   1. Moderately atypical mole, observation 2. Moderate to severely atypical mole, needs repeat shave removal  - please call patient

## 2022-09-28 ENCOUNTER — Ambulatory Visit: Payer: Medicare Other | Admitting: Dermatology

## 2022-09-28 ENCOUNTER — Encounter: Payer: Self-pay | Admitting: Dermatology

## 2022-09-28 DIAGNOSIS — D235 Other benign neoplasm of skin of trunk: Secondary | ICD-10-CM | POA: Diagnosis not present

## 2022-09-28 DIAGNOSIS — D225 Melanocytic nevi of trunk: Secondary | ICD-10-CM

## 2022-09-28 DIAGNOSIS — D239 Other benign neoplasm of skin, unspecified: Secondary | ICD-10-CM

## 2022-09-28 DIAGNOSIS — D229 Melanocytic nevi, unspecified: Secondary | ICD-10-CM

## 2022-09-28 NOTE — Patient Instructions (Addendum)
Wound Care Instructions  Cleanse wound gently with soap and water once a day then pat dry with clean gauze. Apply a thin coat of Petrolatum (petroleum jelly, "Vaseline") over the wound (unless you have an allergy to this). We recommend that you use a new, sterile tube of Vaseline. Do not pick or remove scabs. Do not remove the yellow or white "healing tissue" from the base of the wound.  Cover the wound with fresh, clean, nonstick gauze and secure with paper tape. You may use Band-Aids in place of gauze and tape if the wound is small enough, but would recommend trimming much of the tape off as there is often too much. Sometimes Band-Aids can irritate the skin.  You should call the office for your biopsy report after 1 week if you have not already been contacted.  If you experience any problems, such as abnormal amounts of bleeding, swelling, significant bruising, significant pain, or evidence of infection, please call the office immediately.  FOR ADULT SURGERY PATIENTS: If you need something for pain relief you may take 1 extra strength Tylenol (acetaminophen) AND 2 Ibuprofen (200mg each) together every 4 hours as needed for pain. (do not take these if you are allergic to them or if you have a reason you should not take them.) Typically, you may only need pain medication for 1 to 3 days.      Melanoma ABCDEs  Melanoma is the most dangerous type of skin cancer, and is the leading cause of death from skin disease.  You are more likely to develop melanoma if you: Have light-colored skin, light-colored eyes, or red or blond hair Spend a lot of time in the sun Tan regularly, either outdoors or in a tanning bed Have had blistering sunburns, especially during childhood Have a close family member who has had a melanoma Have atypical moles or large birthmarks  Early detection of melanoma is key since treatment is typically straightforward and cure rates are extremely high if we catch it early.    The first sign of melanoma is often a change in a mole or a new dark spot.  The ABCDE system is a way of remembering the signs of melanoma.  A for asymmetry:  The two halves do not match. B for border:  The edges of the growth are irregular. C for color:  A mixture of colors are present instead of an even brown color. D for diameter:  Melanomas are usually (but not always) greater than 6mm - the size of a pencil eraser. E for evolution:  The spot keeps changing in size, shape, and color.  Please check your skin once per month between visits. You can use a small mirror in front and a large mirror behind you to keep an eye on the back side or your body.   If you see any new or changing lesions before your next follow-up, please call to schedule a visit.  Please continue daily skin protection including broad spectrum sunscreen SPF 30+ to sun-exposed areas, reapplying every 2 hours as needed when you're outdoors.   Staying in the shade or wearing long sleeves, sun glasses (UVA+UVB protection) and wide brim hats (4-inch brim around the entire circumference of the hat) are also recommended for sun protection.    Due to recent changes in healthcare laws, you may see results of your pathology and/or laboratory studies on MyChart before the doctors have had a chance to review them. We understand that in some cases there may   be results that are confusing or concerning to you. Please understand that not all results are received at the same time and often the doctors may need to interpret multiple results in order to provide you with the best plan of care or course of treatment. Therefore, we ask that you please give us 2 business days to thoroughly review all your results before contacting the office for clarification. Should we see a critical lab result, you will be contacted sooner.   If You Need Anything After Your Visit  If you have any questions or concerns for your doctor, please call our main  line at [phone] and press option 4 to reach your doctor's medical assistant. If no one answers, please leave a voicemail as directed and we will return your call as soon as possible. Messages left after 4 pm will be answered the following business day.   You may also send us a message via MyChart. We typically respond to MyChart messages within 1-2 business days.  For prescription refills, please ask your pharmacy to contact our office. Our fax number is [phone].  If you have an urgent issue when the clinic is closed that cannot wait until the next business day, you can page your doctor at the number below.    Please note that while we do our best to be available for urgent issues outside of office hours, we are not available 24/7.   If you have an urgent issue and are unable to reach us, you may choose to seek medical care at your doctor's office, retail clinic, urgent care center, or emergency room.  If you have a medical emergency, please immediately call 911 or go to the emergency department.  Pager Numbers  - Dr. Kowalski: [phone]  - Dr. Moye: [phone]  - Dr. Stewart: [phone]  In the event of inclement weather, please call our main line at [phone] for an update on the status of any delays or closures.  Dermatology Medication Tips: Please keep the boxes that topical medications come in in order to help keep track of the instructions about where and how to use these. Pharmacies typically print the medication instructions only on the boxes and not directly on the medication tubes.   If your medication is too expensive, please contact our office at [phone] option 4 or send us a message through MyChart.   We are unable to tell what your co-pay for medications will be in advance as this is different depending on your insurance coverage. However, we may be able to find a substitute medication at lower cost or fill out paperwork to get insurance to cover a  needed medication.   If a prior authorization is required to get your medication covered by your insurance company, please allow us 1-2 business days to complete this process.  Drug prices often vary depending on where the prescription is filled and some pharmacies may offer cheaper prices.  The website www.goodrx.com contains coupons for medications through different pharmacies. The prices here do not account for what the cost may be with help from insurance (it may be cheaper with your insurance), but the website can give you the price if you did not use any insurance.  - You can print the associated coupon and take it with your prescription to the pharmacy.  - You may also stop by our office during regular business hours and pick up a GoodRx coupon card.  - If you need your prescription sent electronically to a   different pharmacy, notify our office through [facility] MyChart or by phone at [phone] option 4.     Si Usted Necesita Algo Despus de Su Visita  Tambin puede enviarnos un mensaje a travs de MyChart. Por lo general respondemos a los mensajes de MyChart en el transcurso de 1 a 2 das hbiles.  Para renovar recetas, por favor pida a su farmacia que se ponga en contacto con nuestra oficina. Nuestro nmero de fax es el [phone].  Si tiene un asunto urgente cuando la clnica est cerrada y que no puede esperar hasta el siguiente da hbil, puede llamar/localizar a su doctor(a) al nmero que aparece a continuacin.   Por favor, tenga en cuenta que aunque hacemos todo lo posible para estar disponibles para asuntos urgentes fuera del horario de oficina, no estamos disponibles las 24 horas del da, los 7 das de la semana.   Si tiene un problema urgente y no puede comunicarse con nosotros, puede optar por buscar atencin mdica  en el consultorio de su doctor(a), en una clnica privada, en un centro de atencin urgente o en una sala de emergencias.  Si tiene una emergencia  mdica, por favor llame inmediatamente al 911 o vaya a la sala de emergencias.  Nmeros de bper  - Dr. Kowalski: [phone]  - Dra. Moye: [phone]  - Dra. Stewart: [phone]  En caso de inclemencias del tiempo, por favor llame a nuestra lnea principal al [phone] para una actualizacin sobre el estado de cualquier retraso o cierre.  Consejos para la medicacin en dermatologa: Por favor, guarde las cajas en las que vienen los medicamentos de uso tpico para ayudarle a seguir las instrucciones sobre dnde y cmo usarlos. Las farmacias generalmente imprimen las instrucciones del medicamento slo en las cajas y no directamente en los tubos del medicamento.   Si su medicamento es muy caro, por favor, pngase en contacto con nuestra oficina llamando al [phone] y presione la opcin 4 o envenos un mensaje a travs de MyChart.   No podemos decirle cul ser su copago por los medicamentos por adelantado ya que esto es diferente dependiendo de la cobertura de su seguro. Sin embargo, es posible que podamos encontrar un medicamento sustituto a menor costo o llenar un formulario para que el seguro cubra el medicamento que se considera necesario.   Si se requiere una autorizacin previa para que su compaa de seguros cubra su medicamento, por favor permtanos de 1 a 2 das hbiles para completar este proceso.  Los precios de los medicamentos varan con frecuencia dependiendo del lugar de dnde se surte la receta y alguna farmacias pueden ofrecer precios ms baratos.  El sitio web www.goodrx.com tiene cupones para medicamentos de diferentes farmacias. Los precios aqu no tienen en cuenta lo que podra costar con la ayuda del seguro (puede ser ms barato con su seguro), pero el sitio web puede darle el precio si no utiliz ningn seguro.  - Puede imprimir el cupn correspondiente y llevarlo con su receta a la farmacia.  - Tambin puede pasar por nuestra oficina durante el horario de  atencin regular y recoger una tarjeta de cupones de GoodRx.  - Si necesita que su receta se enve electrnicamente a una farmacia diferente, informe a nuestra oficina a travs de MyChart de [place] o por telfono llamando al [phone] y presione la opcin 4.  

## 2022-09-28 NOTE — Progress Notes (Signed)
   Follow-Up Visit   Subjective  Barbara Gates is a 71 y.o. female who presents for the following: Dysplastic Nevus (Right central upper abdomen, moderate to severe atypia. Shave removal today. ).   The following portions of the chart were reviewed this encounter and updated as appropriate:       Review of Systems:  No other skin or systemic complaints except as noted in HPI or Assessment and Plan.  Objective  Well appearing patient in no apparent distress; mood and affect are within normal limits.  A focused examination was performed including face, abdomen. Relevant physical exam findings are noted in the Assessment and Plan.  right central upper abdomen Pink biopsy site  abdomen Tan-brown and/or pink-flesh-colored symmetric macules and papules.     Assessment & Plan  Dysplastic nevus right central upper abdomen  Biopsy proven moderate to severe dysplastic nevus. Shave removal today.  Ok to send results through [facility].   Epidermal / dermal shaving - right central upper abdomen  Lesion diameter (cm):  1.4 Informed consent: discussed and consent obtained   Patient was prepped and draped in usual sterile fashion: Area prepped with alcohol. Anesthesia: the lesion was anesthetized in a standard fashion   Anesthetic:  1% lidocaine w/ epinephrine 1-100,000 buffered w/ 8.4% NaHCO3 Instrument used: flexible razor blade   Hemostasis achieved with: pressure, aluminum chloride and electrodesiccation   Outcome: patient tolerated procedure well   Post-procedure details: wound care instructions given   Post-procedure details comment:  Ointment and small bandage applied  Specimen 1 - Surgical pathology Differential Diagnosis: Dysplastic nevus with moderate to severe atypia Check Margins: Yes [id number]  Nevus abdomen  Benign-appearing, stable.  Photo compared from 08/04/2020. Observation.  Call clinic for new or changing moles.  Recommend daily use of broad spectrum spf 30+  sunscreen to sun-exposed areas.    Return as scheduled.  IJamesetta Orleans, CMA, am acting as scribe for Brendolyn Patty, MD .   Documentation: I have reviewed the above documentation for accuracy and completeness, and I agree with the above.  Brendolyn Patty MD

## 2022-10-04 ENCOUNTER — Telehealth: Payer: Self-pay

## 2022-10-04 NOTE — Telephone Encounter (Signed)
-----   Message from Brendolyn Patty, MD sent at 10/03/2022  6:31 PM EST ----- Skin , right central upper abdomen NO RESIDUAL DYSPLASTIC NEVUS, MARGINS FREE  S/p shave removal mod/severe dysplastic nevus, margins free   - please call patient

## 2022-10-04 NOTE — Telephone Encounter (Signed)
Advised pt of bx results/sh ?

## 2023-05-22 ENCOUNTER — Ambulatory Visit: Payer: Medicare Other | Admitting: Dermatology

## 2023-05-29 ENCOUNTER — Ambulatory Visit: Payer: Medicare Other | Admitting: Dermatology

## 2023-05-29 VITALS — BP 122/85 | HR 52

## 2023-05-29 DIAGNOSIS — B078 Other viral warts: Secondary | ICD-10-CM

## 2023-05-29 DIAGNOSIS — D489 Neoplasm of uncertain behavior, unspecified: Secondary | ICD-10-CM

## 2023-05-29 DIAGNOSIS — L814 Other melanin hyperpigmentation: Secondary | ICD-10-CM | POA: Diagnosis not present

## 2023-05-29 NOTE — Patient Instructions (Addendum)
Biopsy Wound Care Instructions  Leave the original bandage on for 24 hours if possible.  If the bandage becomes soaked or soiled before that time, it is OK to remove it and examine the wound.  A small amount of post-operative bleeding is normal.  If excessive bleeding occurs, remove the bandage, place gauze over the site and apply continuous pressure (no peeking) over the area for 30 minutes. If this does not work, please call our clinic as soon as possible or page your doctor if it is after hours.   Once a day, cleanse the wound with soap and water. It is fine to shower. If a thick crust develops you may use a Q-tip dipped into dilute hydrogen peroxide (mix 1:1 with water) to dissolve it.  Hydrogen peroxide can slow the healing process, so use it only as needed.    After washing, apply petroleum jelly (Vaseline) or an antibiotic ointment if your doctor prescribed one for you, followed by a bandage.    For best healing, the wound should be covered with a layer of ointment at all times. If you are not able to keep the area covered with a bandage to hold the ointment in place, this may mean re-applying the ointment several times a day.  Continue this wound care until the wound has healed and is no longer open.   Itching and mild discomfort is normal during the healing process. However, if you develop pain or severe itching, please call our office.   If you have any discomfort, you can take Tylenol (acetaminophen) or ibuprofen as directed on the bottle. (Please do not take these if you have an allergy to them or cannot take them for another reason).  Some redness, tenderness and white or yellow material in the wound is normal healing.  If the area becomes very sore and red, or develops a thick yellow-green material (pus), it may be infected; please notify us.    If you have stitches, return to clinic as directed to have the stitches removed. You will continue wound care for 2-3 days after the stitches  are removed.   Wound healing continues for up to one year following surgery. It is not unusual to experience pain in the scar from time to time during the interval.  If the pain becomes severe or the scar thickens, you should notify the office.    A slight amount of redness in a scar is expected for the first six months.  After six months, the redness will fade and the scar will soften and fade.  The color difference becomes less noticeable with time.  If there are any problems, return for a post-op surgery check at your earliest convenience.  To improve the appearance of the scar, you can use silicone scar gel, cream, or sheets (such as Mederma or Serica) every night for up to one year. These are available over the counter (without a prescription).  Please call our office at [phone] for any questions or concerns.   Due to recent changes in healthcare laws, you may see results of your pathology and/or laboratory studies on MyChart before the doctors have had a chance to review them. We understand that in some cases there may be results that are confusing or concerning to you. Please understand that not all results are received at the same time and often the doctors may need to interpret multiple results in order to provide you with the best plan of care or course of treatment.   Therefore, we ask that you please give us 2 business days to thoroughly review all your results before contacting the office for clarification. Should we see a critical lab result, you will be contacted sooner.   If You Need Anything After Your Visit  If you have any questions or concerns for your doctor, please call our main line at [phone] and press option 4 to reach your doctor's medical assistant. If no one answers, please leave a voicemail as directed and we will return your call as soon as possible. Messages left after 4 pm will be answered the following business day.   You may also send us a message via  MyChart. We typically respond to MyChart messages within 1-2 business days.  For prescription refills, please ask your pharmacy to contact our office. Our fax number is [phone].  If you have an urgent issue when the clinic is closed that cannot wait until the next business day, you can page your doctor at the number below.    Please note that while we do our best to be available for urgent issues outside of office hours, we are not available 24/7.   If you have an urgent issue and are unable to reach us, you may choose to seek medical care at your doctor's office, retail clinic, urgent care center, or emergency room.  If you have a medical emergency, please immediately call 911 or go to the emergency department.  Pager Numbers  - Dr. Kowalski: [phone]  - Dr. Moye: [phone]  - Dr. Stewart: [phone]  In the event of inclement weather, please call our main line at [phone] for an update on the status of any delays or closures.  Dermatology Medication Tips: Please keep the boxes that topical medications come in in order to help keep track of the instructions about where and how to use these. Pharmacies typically print the medication instructions only on the boxes and not directly on the medication tubes.   If your medication is too expensive, please contact our office at [phone] option 4 or send us a message through MyChart.   We are unable to tell what your co-pay for medications will be in advance as this is different depending on your insurance coverage. However, we may be able to find a substitute medication at lower cost or fill out paperwork to get insurance to cover a needed medication.   If a prior authorization is required to get your medication covered by your insurance company, please allow us 1-2 business days to complete this process.  Drug prices often vary depending on where the prescription is filled and some pharmacies may offer cheaper  prices.  The website www.goodrx.com contains coupons for medications through different pharmacies. The prices here do not account for what the cost may be with help from insurance (it may be cheaper with your insurance), but the website can give you the price if you did not use any insurance.  - You can print the associated coupon and take it with your prescription to the pharmacy.  - You may also stop by our office during regular business hours and pick up a GoodRx coupon card.  - If you need your prescription sent electronically to a different pharmacy, notify our office through [facility] MyChart or by phone at [phone] option 4.     Si Usted Necesita Algo Despus de Su Visita  Tambin puede enviarnos un mensaje a travs de MyChart. Por lo general respondemos a los mensajes de   MyChart en el transcurso de 1 a 2 das hbiles.  Para renovar recetas, por favor pida a su farmacia que se ponga en contacto con nuestra oficina. Nuestro nmero de fax es el [phone].  Si tiene un asunto urgente cuando la clnica est cerrada y que no puede esperar hasta el siguiente da hbil, puede llamar/localizar a su doctor(a) al nmero que aparece a continuacin.   Por favor, tenga en cuenta que aunque hacemos todo lo posible para estar disponibles para asuntos urgentes fuera del horario de oficina, no estamos disponibles las 24 horas del da, los 7 das de la semana.   Si tiene un problema urgente y no puede comunicarse con nosotros, puede optar por buscar atencin mdica  en el consultorio de su doctor(a), en una clnica privada, en un centro de atencin urgente o en una sala de emergencias.  Si tiene una emergencia mdica, por favor llame inmediatamente al 911 o vaya a la sala de emergencias.  Nmeros de bper  - Dr. Kowalski: [phone]  - Dra. Moye: [phone]  - Dra. Stewart: [phone]  En caso de inclemencias del tiempo, por favor llame a nuestra lnea principal al [phone]  para una actualizacin sobre el estado de cualquier retraso o cierre.  Consejos para la medicacin en dermatologa: Por favor, guarde las cajas en las que vienen los medicamentos de uso tpico para ayudarle a seguir las instrucciones sobre dnde y cmo usarlos. Las farmacias generalmente imprimen las instrucciones del medicamento slo en las cajas y no directamente en los tubos del medicamento.   Si su medicamento es muy caro, por favor, pngase en contacto con nuestra oficina llamando al [phone] y presione la opcin 4 o envenos un mensaje a travs de MyChart.   No podemos decirle cul ser su copago por los medicamentos por adelantado ya que esto es diferente dependiendo de la cobertura de su seguro. Sin embargo, es posible que podamos encontrar un medicamento sustituto a menor costo o llenar un formulario para que el seguro cubra el medicamento que se considera necesario.   Si se requiere una autorizacin previa para que su compaa de seguros cubra su medicamento, por favor permtanos de 1 a 2 das hbiles para completar este proceso.  Los precios de los medicamentos varan con frecuencia dependiendo del lugar de dnde se surte la receta y alguna farmacias pueden ofrecer precios ms baratos.  El sitio web www.goodrx.com tiene cupones para medicamentos de diferentes farmacias. Los precios aqu no tienen en cuenta lo que podra costar con la ayuda del seguro (puede ser ms barato con su seguro), pero el sitio web puede darle el precio si no utiliz ningn seguro.  - Puede imprimir el cupn correspondiente y llevarlo con su receta a la farmacia.  - Tambin puede pasar por nuestra oficina durante el horario de atencin regular y recoger una tarjeta de cupones de GoodRx.  - Si necesita que su receta se enve electrnicamente a una farmacia diferente, informe a nuestra oficina a travs de MyChart de [place] o por telfono llamando al [phone] y presione la opcin 4.         Due to  recent changes in healthcare laws, you may see results of your pathology and/or laboratory studies on MyChart before the doctors have had a chance to review them. We understand that in some cases there may be results that are confusing or concerning to you. Please understand that not all results are received at the same time and often the doctors may   need to interpret multiple results in order to provide you with the best plan of care or course of treatment. Therefore, we ask that you please give us 2 business days to thoroughly review all your results before contacting the office for clarification. Should we see a critical lab result, you will be contacted sooner.   If You Need Anything After Your Visit  If you have any questions or concerns for your doctor, please call our main line at [phone] and press option 4 to reach your doctor's medical assistant. If no one answers, please leave a voicemail as directed and we will return your call as soon as possible. Messages left after 4 pm will be answered the following business day.   You may also send us a message via MyChart. We typically respond to MyChart messages within 1-2 business days.  For prescription refills, please ask your pharmacy to contact our office. Our fax number is [phone].  If you have an urgent issue when the clinic is closed that cannot wait until the next business day, you can page your doctor at the number below.    Please note that while we do our best to be available for urgent issues outside of office hours, we are not available 24/7.   If you have an urgent issue and are unable to reach us, you may choose to seek medical care at your doctor's office, retail clinic, urgent care center, or emergency room.  If you have a medical emergency, please immediately call 911 or go to the emergency department.  Pager Numbers  - Dr. Kowalski: [phone]  - Dr. Moye: [phone]  - Dr. Stewart: [phone]  In the  event of inclement weather, please call our main line at [phone] for an update on the status of any delays or closures.  Dermatology Medication Tips: Please keep the boxes that topical medications come in in order to help keep track of the instructions about where and how to use these. Pharmacies typically print the medication instructions only on the boxes and not directly on the medication tubes.   If your medication is too expensive, please contact our office at [phone] option 4 or send us a message through MyChart.   We are unable to tell what your co-pay for medications will be in advance as this is different depending on your insurance coverage. However, we may be able to find a substitute medication at lower cost or fill out paperwork to get insurance to cover a needed medication.   If a prior authorization is required to get your medication covered by your insurance company, please allow us 1-2 business days to complete this process.  Drug prices often vary depending on where the prescription is filled and some pharmacies may offer cheaper prices.  The website www.goodrx.com contains coupons for medications through different pharmacies. The prices here do not account for what the cost may be with help from insurance (it may be cheaper with your insurance), but the website can give you the price if you did not use any insurance.  - You can print the associated coupon and take it with your prescription to the pharmacy.  - You may also stop by our office during regular business hours and pick up a GoodRx coupon card.  - If you need your prescription sent electronically to a different pharmacy, notify our office through [facility] MyChart or by phone at [phone] option 4.     Si Usted Necesita Algo Despus de Su   Visita  Tambin puede enviarnos un mensaje a travs de MyChart. Por lo general respondemos a los mensajes de MyChart en el transcurso de 1 a 2 das hbiles.  Para  renovar recetas, por favor pida a su farmacia que se ponga en contacto con nuestra oficina. Nuestro nmero de fax es el [phone].  Si tiene un asunto urgente cuando la clnica est cerrada y que no puede esperar hasta el siguiente da hbil, puede llamar/localizar a su doctor(a) al nmero que aparece a continuacin.   Por favor, tenga en cuenta que aunque hacemos todo lo posible para estar disponibles para asuntos urgentes fuera del horario de oficina, no estamos disponibles las 24 horas del da, los 7 das de la semana.   Si tiene un problema urgente y no puede comunicarse con nosotros, puede optar por buscar atencin mdica  en el consultorio de su doctor(a), en una clnica privada, en un centro de atencin urgente o en una sala de emergencias.  Si tiene una emergencia mdica, por favor llame inmediatamente al 911 o vaya a la sala de emergencias.  Nmeros de bper  - Dr. Kowalski: [phone]  - Dra. Moye: [phone]  - Dra. Stewart: [phone]  En caso de inclemencias del tiempo, por favor llame a nuestra lnea principal al [phone] para una actualizacin sobre el estado de cualquier retraso o cierre.  Consejos para la medicacin en dermatologa: Por favor, guarde las cajas en las que vienen los medicamentos de uso tpico para ayudarle a seguir las instrucciones sobre dnde y cmo usarlos. Las farmacias generalmente imprimen las instrucciones del medicamento slo en las cajas y no directamente en los tubos del medicamento.   Si su medicamento es muy caro, por favor, pngase en contacto con nuestra oficina llamando al [phone] y presione la opcin 4 o envenos un mensaje a travs de MyChart.   No podemos decirle cul ser su copago por los medicamentos por adelantado ya que esto es diferente dependiendo de la cobertura de su seguro. Sin embargo, es posible que podamos encontrar un medicamento sustituto a menor costo o llenar un formulario para que el seguro cubra el  medicamento que se considera necesario.   Si se requiere una autorizacin previa para que su compaa de seguros cubra su medicamento, por favor permtanos de 1 a 2 das hbiles para completar este proceso.  Los precios de los medicamentos varan con frecuencia dependiendo del lugar de dnde se surte la receta y alguna farmacias pueden ofrecer precios ms baratos.  El sitio web www.goodrx.com tiene cupones para medicamentos de diferentes farmacias. Los precios aqu no tienen en cuenta lo que podra costar con la ayuda del seguro (puede ser ms barato con su seguro), pero el sitio web puede darle el precio si no utiliz ningn seguro.  - Puede imprimir el cupn correspondiente y llevarlo con su receta a la farmacia.  - Tambin puede pasar por nuestra oficina durante el horario de atencin regular y recoger una tarjeta de cupones de GoodRx.  - Si necesita que su receta se enve electrnicamente a una farmacia diferente, informe a nuestra oficina a travs de MyChart de [place] o por telfono llamando al [phone] y presione la opcin 4.  

## 2023-05-29 NOTE — Progress Notes (Signed)
   Follow-Up Visit   Subjective  Barbara Gates is a 72 y.o. female who presents for the following: patient here concerning a spot at left corner of chin. States that has been there for many years but wont go away.  Gets sore and irritated.   The following portions of the chart were reviewed this encounter and updated as appropriate: medications, allergies, medical history  Review of Systems:  No other skin or systemic complaints except as noted in HPI or Assessment and Plan.  Objective  Well appearing patient in no apparent distress; mood and affect are within normal limits.  A focused examination was performed of the following areas: Face   Relevant exam findings are noted in the Assessment and Plan.  left chin at jaw 5 mm pink tan scaly papule L chin at jaw       Assessment & Plan     Neoplasm of uncertain behavior left chin at jaw  Epidermal / dermal shaving  Lesion diameter (cm):  0.6 Informed consent: discussed and consent obtained   Patient was prepped and draped in usual sterile fashion: Area prepped with alcohol. Anesthesia: the lesion was anesthetized in a standard fashion   Anesthetic:  1% lidocaine w/ epinephrine 1-100,000 buffered w/ 8.4% NaHCO3 Instrument used: flexible razor blade   Hemostasis achieved with: pressure, aluminum chloride and electrodesiccation   Outcome: patient tolerated procedure well   Post-procedure details: wound care instructions given   Post-procedure details comment:  Ointment and small bandage applied.   Specimen 1 - Surgical pathology Differential Diagnosis: R/o isk vs scc vs bcc  Check Margins: No  R/o isk vs scc vs bcc   LENTIGINES Exam: scattered tan macules Due to sun exposure Treatment Plan: Benign-appearing, observe. Recommend daily broad spectrum sunscreen SPF 30+ to sun-exposed areas, reapply every 2 hours as needed.  Call for any changes   Return for keep follow up as scheduled .  I, Asher Muir, CMA,  am acting as scribe for Willeen Niece, MD.   Documentation: I have reviewed the above documentation for accuracy and completeness, and I agree with the above.  Willeen Niece, MD

## 2023-06-05 ENCOUNTER — Telehealth: Payer: Self-pay

## 2023-06-05 NOTE — Telephone Encounter (Signed)
-----   Message from Willeen Niece sent at 06/05/2023  1:30 PM EDT ----- Skin , left chin at jaw VERRUCA VULGARIS, IRRITATED  Benign irritated wart - please call patient

## 2023-06-05 NOTE — Telephone Encounter (Signed)
 Left pt msg to call for bx results/sh 

## 2023-06-06 ENCOUNTER — Telehealth: Payer: Self-pay

## 2023-06-06 NOTE — Telephone Encounter (Signed)
Discussed biopsy results with pt  °

## 2023-06-06 NOTE — Telephone Encounter (Signed)
-----   Message from Willeen Niece sent at 06/05/2023  1:30 PM EDT ----- Skin , left chin at jaw VERRUCA VULGARIS, IRRITATED  Benign irritated wart - please call patient

## 2023-06-14 ENCOUNTER — Ambulatory Visit: Payer: Medicare Other | Admitting: Family Medicine

## 2023-06-14 ENCOUNTER — Other Ambulatory Visit: Payer: Self-pay | Admitting: Family Medicine

## 2023-06-14 VITALS — BP 125/65 | HR 55 | Temp 98.4°F | Ht 68.0 in | Wt 137.0 lb

## 2023-06-14 DIAGNOSIS — R058 Other specified cough: Secondary | ICD-10-CM

## 2023-06-14 DIAGNOSIS — H5500 Unspecified nystagmus: Secondary | ICD-10-CM

## 2023-06-14 DIAGNOSIS — Z0000 Encounter for general adult medical examination without abnormal findings: Secondary | ICD-10-CM

## 2023-06-14 DIAGNOSIS — N182 Chronic kidney disease, stage 2 (mild): Secondary | ICD-10-CM | POA: Diagnosis not present

## 2023-06-14 DIAGNOSIS — Z7689 Persons encountering health services in other specified circumstances: Secondary | ICD-10-CM

## 2023-06-14 DIAGNOSIS — R2689 Other abnormalities of gait and mobility: Secondary | ICD-10-CM | POA: Diagnosis not present

## 2023-06-14 DIAGNOSIS — M8580 Other specified disorders of bone density and structure, unspecified site: Secondary | ICD-10-CM

## 2023-06-14 DIAGNOSIS — R413 Other amnesia: Secondary | ICD-10-CM | POA: Diagnosis not present

## 2023-06-14 DIAGNOSIS — E7841 Elevated Lipoprotein(a): Secondary | ICD-10-CM

## 2023-06-14 NOTE — Progress Notes (Signed)
New patient visit   Patient: Barbara Gates   DOB: 09-07-1951   72 y.o. Female  MRN: [id number] Visit Date: 06/14/2023  Today's healthcare provider: Sherlyn Hay, DO   Chief Complaint  Patient presents with   balance issues    Patient has some issues with progressive off balance.  She has diagnosis of Parkison's disease but states this has become worse since getting her last Covid vaccine.     Subjective    Barbara Gates is a 72 y.o. female who presents today as a new patient to establish care.  HPI HPI     balance issues    Additional comments: Patient has some issues with progressive off balance.  She has diagnosis of Parkison's disease but states this has become worse since getting her last Covid vaccine.        Last edited by Adline Peals, CMA on 06/14/2023  2:33 PM.      Balance: Has had issues with balance a lot through the years She reports that she was diagnosed with parkinson's by Dr. Sherryll Burger  - taking OTC Nusapure Phosphatidylserine supplement - "either works extremely well for her or she doesn't have parkinson's"  - DaT scan "positive" 2 years ago - however, study declined to enroll her   - sen-1 nucleotide test a couple months before dx May 2022 Had covid vaccine a couple weeks ago and now having  increased balance issues     - Other covid vaccine was just before the three falls that had her see Dr. Sherryll Burger previously (three total in one month):  - Getting up out of dentist's chair; she fell straight forward across the room  - Once getting out of an suv parked uphill  - Had another similar episode she does not specifically remember and which she "had an excuse for."  Balance: can't walk on a log  Can walk ok with head upright, otherwise she falls to the right  - doing silver sneakers, rock steady boxing, shelving books at [employer], and walking as much as she can. Three times in the last ten days; she did her fall/lean and someone next to her  caught her; she feels she wouldn't have fallen down but still forced an awarenes. Is careful to lean against something in times when she might fall down, such as putting on pants.  - this morning, lost her balance a little bit on the stairs for the first time.   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 Based on intelligence test at [facility] the year before, her ratings were high enough that they didn't need to follow her for dementia.  She states that her internal "thermostat is broken" and that her temperature in cold weather is 95 and pulse is around 50.   Can take a deep breath and induce wheezing on expiration  Had a prolia infusion in April/May  Of note: Patient did bring a list of concerns, which has been uploaded into the media tab under chart review.    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 Johnnette Litter   Hypercalcemia    Hyperlipidemia    Has history of this   Motion sickness    Nodule of vagina    Osteoporosis    Osteoporosis    Parkinson's disease 03/2021   Redundant colon    SOB (shortness of breath)    Past Surgical History:  Procedure Laterality Date   ABDOMINAL HYSTERECTOMY  1994   Total including ovarie. Due to endometriosis   APPENDECTOMY     with hysterectomy    BREAST BIOPSY Left 11/08/2012   Dr. Lemar Livings, neg   CATARACT EXTRACTION W/PHACO Right 05/27/2020   Procedure: CATARACT EXTRACTION PHACO AND INTRAOCULAR LENS PLACEMENT (IOC) RIGHT PANOPTIC LENS;  Surgeon: Lockie Mola, MD;  Location: [facility];  Service: Ophthalmology;  Laterality: Right;  2.14 0:43.8 4.8%   CATARACT EXTRACTION W/PHACO Left 07/22/2020   Procedure: CATARACT EXTRACTION PHACO AND INTRAOCULAR LENS PLACEMENT (IOC) LEFT PANOPTIX LENS 5.31 00:49.1 10.8%;  Surgeon: Lockie Mola, MD;  Location: [facility];  Service: Ophthalmology;  Laterality: Left;   EYE SURGERY  2021   cataracts   granular cell tumor removed     OTHER SURGICAL HISTORY     Surgical Lipoma Removal; 12/13/2012  12/13/2012   VULVAR LESION REMOVAL Left 08/22/2018   Procedure: Excision left vulvar mass, possible sentinel node mapping with blue dye, possible left inguinal lymph node biopsy or dissection;  Surgeon: Leida Lauth, MD;  Location: [facility];  Service: Gynecology;  Laterality: Left;   Family Status  Relation Name Status   Mother Mom Deceased   Father Dad Deceased   Brother Bill Deceased   Sister Lars Pinks   Brother  Alive   MGF Mom's father - pneumonia (Not Specified)   MGM Nannie (Not Specified)   PGF Grandpa (Not Specified)   PGM Grandma (Not Specified)   Brother Clinical biochemist (Not Specified)  No partnership data on file   Family History  Problem Relation Age of Onset   Cancer Mother        lung, pancreas, melanoma, carcinoid tumors   Hypertension Mother    Hyperlipidemia Mother    Diabetes Mother    Hearing loss Mother    Vision loss Mother    Hypertension Father    Diabetes Father    Hyperlipidemia Father    Parkinson's disease Father    CAD Father    Vascular Disease Father        "hardening of the arteries"   Hearing loss Father    Heart disease Father    Diabetes Brother    Hypertension Brother    ADD / ADHD Brother    Alcohol abuse Brother    Cancer Brother    Breast cancer Sister 16   Arthritis Sister    Hypertension Sister    Dupuytren's contracture Sister    Cancer Sister    Early death Maternal Grandfather    Heart disease Maternal Grandmother    Heart disease Paternal Grandfather    Early  death Paternal Grandmother    Heart disease Paternal Grandmother    Diabetes Brother    Hearing loss Brother    Hyperlipidemia Brother    Obesity Brother    Social History   Socioeconomic History   Marital status: Married    Spouse name: Not on file   Number of children: 0   Years of education: Not  on file   Highest education level: Bachelor's degree (e.g., BA, AB, BS)  Occupational History   Occupation: retired  Tobacco Use   Smoking status: Never   Smokeless tobacco: Never  Vaping Use   Vaping status: Never Used  Substance and Sexual Activity   Alcohol use: Yes    Alcohol/week: 1.0 standard drink of alcohol    Types: 1 Glasses of wine per week    Comment: 1 glass of wine on Friday.   Drug use: Never   Sexual activity: Not Currently    Birth control/protection: Surgical    Comment: Hysterecetomy  Other Topics Concern   Not on file  Social History Narrative   Not on file   Social Determinants of Health   Financial Resource Strain: Low Risk  (06/12/2023)   Overall Financial Resource Strain (CARDIA)    Difficulty of Paying Living Expenses: Not hard at all  Food Insecurity: No Food Insecurity (06/12/2023)   Hunger Vital Sign    Worried About Running Out of Food in the Last Year: Never true    Ran Out of Food in the Last Year: Never true  Transportation Needs: No Transportation Needs (06/12/2023)   PRAPARE - Administrator, Civil Service (Medical): No    Lack of Transportation (Non-Medical): No  Physical Activity: Sufficiently Active (06/12/2023)   Exercise Vital Sign    Days of Exercise per Week: 7 days    Minutes of Exercise per Session: 30 min  Stress: Stress Concern Present (06/12/2023)   [employer] of Occupational Health - Occupational Stress Questionnaire    Feeling of Stress : To some extent  Social Connections: Unknown (06/12/2023)   Social Connection and Isolation Panel [NHANES]    Frequency of Communication with Friends and Family: Once a  week    Frequency of Social Gatherings with Friends and Family: More than three times a week    Attends Religious Services: Patient declined    Database administrator or Organizations: Yes    Attends Engineer, structural: More than 4 times per year    Marital Status: Married   Outpatient Medications Prior to Visit  Medication Sig   Cholecalciferol (VITAMIN D3) 50 MCG (2000 UT) capsule Take 2,000 Units by mouth daily.   Coenzyme Q10 10 MG capsule Take 1 capsule by mouth daily.   Lifitegrast (XIIDRA OP) Apply to eye.   Lysine 600 MG TABS Take 1 tablet by mouth every other day.   Magnesium 250 MG TABS Take 2 tablets by mouth daily.    Multiple Vitamins-Minerals (OCUVITE-LUTEIN PO) Take 0.5 tablets by mouth daily.   Omega-3 Fatty Acids (FISH OIL) 600 MG CAPS Take 600 mg by mouth daily.   Pediatric Multivit-Minerals-C (ANIMAL CHEWABLE MULTI COMPLETE PO) Take 1 tablet by mouth daily.   PHOSPHATIDYLSERINE PO daily.   Polyethylene Glycol 400 (BLINK TEARS OP) Apply 1 drop to eye in the morning and at bedtime.   senna-docusate (SENOKOT-S) 8.6-50 MG tablet Take 2 tablets by mouth every evening.    tretinoin (RETIN-A) 0.05 % cream Apply topically at bedtime. Qhs to face for milia   triamcinolone cream (KENALOG) 0.1 % Apply 1 application topically 2 (two) times daily. Bid aa itchy rash on right posterior shoulder until clear, then prn flares, avoid face, groin, axilla   Turmeric 450 MG CAPS Take 550 mg by mouth every other day.   No facility-administered medications prior to visit.   Allergies  Allergen Reactions   Other Hives  selamectin active ingredient in Revolution Plus for cats.   Poison Ivy Extract Itching   Raloxifene Hcl Cough        Systane [Polyethyl Glycol-Propyl Glycol] Itching   Tamoxifen Cough   Wasp Venom Protein Hives   Xiidra [Lifitegrast] Itching   Mango Flavor Itching and Rash    Immunization History  Administered Date(s) Administered   Fluad Quad(high Dose  65+) 08/25/2022   Hepatitis A, Adult 09/20/2017, 04/05/2018   Influenza Split 09/06/2011, 09/11/2012   Influenza, High Dose Seasonal PF 10/17/2016, 09/03/2018   Influenza,inj,Quad PF,6+ Mos 09/13/2013, 12/07/2015, 08/23/2017   Influenza-Unspecified 09/13/2013, 12/07/2015, 10/17/2016, 08/23/2017, 08/26/2020, 08/18/2021   PFIZER Comirnaty(Gray Top)Covid-19 Tri-Sucrose Vaccine 08/18/2021   PFIZER(Purple Top)SARS-COV-2 Vaccination 01/03/2020, 01/28/2020, 08/26/2020   Pneumococcal Conjugate-13 10/17/2016   Pneumococcal Polysaccharide-23 10/18/2017   Tdap 09/02/2010, 02/11/2021   Typhoid Inactivated 09/20/2017   Zoster Recombinant(Shingrix) 10/23/2018, 03/20/2019   Zoster, Live 02/05/2014    Health Maintenance  Topic Date Due   COVID-19 Vaccine (5 - 2023-24 season) 07/22/2022   MAMMOGRAM  05/27/2023   INFLUENZA VACCINE  06/22/2023   Medicare Annual Wellness (AWV)  08/26/2023   DEXA SCAN  01/19/2024   Colonoscopy  03/21/2024   DTaP/Tdap/Td (3 - Td or Tdap) 02/12/2031   Pneumonia Vaccine 82+ Years old  Completed   Hepatitis C Screening  Completed   Zoster Vaccines- Shingrix  Completed   HPV VACCINES  Aged Out    Patient Care Team: TRUE Garciamartinez, Monico Blitz, DO as PCP - General (Family Medicine) End, Cristal Deer, MD as PCP - Cardiology (Cardiology) Willeen Niece, MD as Consulting Physician (Dermatology) Salena Saner, MD as Consulting Physician (Pulmonary Disease) Geanie Logan, MD as Referring Physician (Otolaryngology) Lockie Mola, MD as Referring Physician (Ophthalmology) Sallee Lange, MD (Ophthalmology)  Review of Systems  Constitutional:  Negative for chills, fatigue and fever.  HENT:  Positive for hearing loss (able to understand face-to-face; otherwise having some difficulty). Negative for congestion, ear pain, rhinorrhea, sneezing and sore throat.   Eyes: Negative.  Negative for pain, redness and visual disturbance.  Respiratory:  Positive for shortness of breath  ("have always had"). Negative for cough and wheezing.   Cardiovascular:  Negative for chest pain and leg swelling.  Gastrointestinal:  Negative for abdominal pain, blood in stool, constipation, diarrhea and nausea.  Endocrine: Negative for polydipsia and polyphagia.  Genitourinary: Negative.  Negative for dysuria, flank pain, hematuria, pelvic pain, vaginal bleeding and vaginal discharge.  Musculoskeletal:  Negative for arthralgias, back pain, gait problem and joint swelling.  Skin:  Negative for rash.  Neurological: Negative.  Negative for dizziness, tremors, seizures, weakness, light-headedness, numbness and headaches.       + balance issue  Hematological:  Negative for adenopathy.  Psychiatric/Behavioral: Negative.  Negative for behavioral problems, confusion and dysphoric mood. The patient is not nervous/anxious and is not hyperactive.          Objective    BP 125/65 (BP Location: Left Arm, Patient Position: Sitting, Cuff Size: Normal)   Pulse (!) 55   Temp 98.4 F (36.9 C) (Oral)   Ht 5\' 8"  (1.727 m)   Wt 137 lb (62.1 kg)   SpO2 100%   BMI 20.83 kg/m      Physical Exam Constitutional:      Appearance: Normal appearance.  HENT:     Head: Normocephalic and atraumatic.  Eyes:     General: Lids are normal. Vision grossly intact. No visual field deficit or scleral icterus.    Extraocular Movements:  Right eye: Nystagmus present.     Left eye: Nystagmus present.     Conjunctiva/sclera: Conjunctivae normal.     Comments: Bilateral nystagmus noted consistently on adduction of eyes from left side back toward centerline, after moving them downward.  Did Not occur on returning to centerline from upward and to the left.  Cardiovascular:     Rate and Rhythm: Normal rate and regular rhythm.     Pulses: Normal pulses.     Heart sounds: Normal heart sounds.  Pulmonary:     Effort: Pulmonary effort is normal. No respiratory distress.     Breath sounds: Normal breath sounds.   Abdominal:     General: Bowel sounds are normal. There is no distension.     Palpations: Abdomen is soft. There is no mass.     Tenderness: There is no abdominal tenderness. There is no guarding.  Musculoskeletal:     Right lower leg: No edema.     Left lower leg: No edema.  Skin:    General: Skin is warm and dry.  Neurological:     Mental Status: She is alert and oriented to person, place, and time. Mental status is at baseline.     Cranial Nerves: No cranial nerve deficit.     Sensory: No sensory deficit.     Motor: No weakness.     Coordination: Coordination normal.     Gait: Tandem walk abnormal. Gait normal.     Deep Tendon Reflexes: Reflexes are normal and symmetric.     Comments: Patient does have difficulty with tandem walk and consistently falls to the right when executing this normally.  However, she states she has figured out how to "cheat" to "pass the test" by keeping her chin elevated when executing the walk, which does actually allow her to do so without falling over.  Psychiatric:        Mood and Affect: Mood normal.        Behavior: Behavior normal.     Depression Screen    06/14/2023    2:28 PM 08/25/2022   11:10 AM 07/27/2022   10:50 AM 12/22/2020    2:37 PM  PHQ 2/9 Scores  PHQ - 2 Score 0 0  0  PHQ- 9 Score 1 3  2   Exception Documentation   Other- indicate reason in comment box      Assessment & Plan     Balance problem Assessment & Plan: Patient endorses significant, persistent, worsening difficulty with balance.  On physical exam, she does have difficulty with tandem walk and consistently falls to the right when executing this in a "normal" manner.  However, she states she has figured out how to "cheat" to "pass the test" by keeping her chin elevated when executing the walk, which does actually allow her to do so without falling over.    Patient also exhibited bilateral nystagmus on adduction of eyes from left side back towards central line, after moving  them downward, which she did Not exhibit when returning to centerline from upward and to the left.     Patient has previously been evaluated by neurology for problems with cognition and some reported trouble falling in the past, as noted in HPI.  However, this presentation seems to be different from her previous and she endorses that the problem is worsening.  I discussed the possibility of ordering an MRI.  Patient states that she is okay with waiting for her currently scheduled neurology appointment 09/21/2023 to discuss it  with the neurologist first.   Establishing care with new doctor, encounter for  Memory impairment of gradual onset Assessment & Plan: She believes she was diagnosed with Parkinson's.  However, upon review of the record, she is noted to have symptoms of premotor parkinsonism (from Dr. Margaretmary Eddy note 09/22/2022 "hypophonia, hyposomnia, ageusia, constipation, paraphasic errors, positive syn one test, positive DAT scan, and MRI Brain NeuroQuant showed medial temporal lobe atrophy").  However, he also noted that Dr. Armandina Stammer evaluated her and found no active parkinsonism features.  He also notes the [facility] neuropsychology impression from 02/25/2022 patient mentioned and which described her as having minor relative weaknesses in cognition that are not diagnostic but can be used as a baseline for future assessment.        Dr. Sherryll Burger did check several blood levels in relation to Alzheimer's disease, which did show an abnormal beta amyloid 40/42 ratio of 0.086 (normal >0.102).  According to the information provided on that lab, this ratio indicates a higher probability of the patient being clinically diagnosed with Alzheimer's disease and testing suggest that this level is comparable to traditional cerebrospinal fluid testing and amyloid positron admission tomography scans.  She was contacted by Dr. Artis Flock 10/11/2022 and advised of this.  The blood work done 09/22/2022 also showed that she had 1 copy of  APO E3 and 1 copy of APO E4, which is also associated with a slight increase in risk of development of late onset Alzheimer's disease compared to the general population; Dr. Artis Flock also informed her of this on that call.      She will be following up with Dr. Sherryll Burger 09/21/2023.   CKD (chronic kidney disease) stage 2, GFR 60-89 ml/min Assessment & Plan: Noted based on previous lab work.  Will recheck with labs ordered prior to annual physical exam.   Nystagmus  Elevated lipoprotein(a) Assessment & Plan: Patient requesting recheck of her apolipoprotein (a) today.  Discussed with her that her level is unlikely to change significantly enough to warrant recheck; she opted to proceed with recheck. Of note, she is attempting to manage her cholesterol levels with diet and exercise alone.  She did try antilipemic medications previously, up to and including Repatha, but experienced side effects which were intolerable to her.   Osteopenia determined by x-ray  Other cough Assessment & Plan: Patient complains of an intermittent cough which occurs when she lies down or turns over at night.  She also endorses dyspnea on exertion while climbing steps and simultaneously talking on the phone.  She is able to wheeze with strong exhalation; the etiology of this particular symptom is unclear.  I do recommend she have a pulmonary function test done and have ordered it below.  Orders: -     Pulmonary Function Test; Future    Return in about 3 months (around 09/14/2023), or if symptoms worsen or fail to improve, for f/u.     There are other unrelated non-urgent complaints, but due to the busy schedule and the amount of time I've already spent with her, time does not permit me to address these routine issues at today's visit. I've requested another appointment to review these additional issues.  I discussed the assessment and treatment plan with the patient  The patient was provided an opportunity to ask  questions and all were answered. The patient agreed with the plan and demonstrated an understanding of the instructions.   The patient was advised to call back or seek an in-person evaluation if the symptoms  worsen or if the condition fails to improve as anticipated.  Total time was 75 minutes. That includes chart review before the visit, the actual patient visit, and time spent on documentation after the visit.    Sherlyn Hay, DO  [facility] [phone] (phone) [phone] (fax)  [facility]

## 2023-06-16 ENCOUNTER — Encounter: Payer: Self-pay | Admitting: Family Medicine

## 2023-06-16 DIAGNOSIS — N182 Chronic kidney disease, stage 2 (mild): Secondary | ICD-10-CM | POA: Insufficient documentation

## 2023-06-16 LAB — HEMOGLOBIN A1C: Hgb A1c MFr Bld: 5.8 % — ABNORMAL HIGH (ref 4.8–5.6)

## 2023-06-21 ENCOUNTER — Encounter: Payer: Self-pay | Admitting: Family Medicine

## 2023-06-21 ENCOUNTER — Telehealth: Payer: Self-pay

## 2023-06-21 ENCOUNTER — Other Ambulatory Visit: Payer: Self-pay | Admitting: Family Medicine

## 2023-06-21 DIAGNOSIS — H5500 Unspecified nystagmus: Secondary | ICD-10-CM | POA: Insufficient documentation

## 2023-06-21 DIAGNOSIS — R058 Other specified cough: Secondary | ICD-10-CM

## 2023-06-21 DIAGNOSIS — M8580 Other specified disorders of bone density and structure, unspecified site: Secondary | ICD-10-CM | POA: Insufficient documentation

## 2023-06-21 DIAGNOSIS — R2689 Other abnormalities of gait and mobility: Secondary | ICD-10-CM | POA: Insufficient documentation

## 2023-06-21 NOTE — Assessment & Plan Note (Addendum)
Patient requesting recheck of her apolipoprotein (a) today.  Discussed with her that her level is unlikely to change significantly enough to warrant recheck; she opted to proceed with recheck. Of note, she is attempting to manage her cholesterol levels with diet and exercise alone.  She did try antilipemic medications previously, up to and including Repatha, but experienced side effects which were intolerable to her.

## 2023-06-21 NOTE — Assessment & Plan Note (Signed)
Patient complains of an intermittent cough which occurs when she lies down or turns over at night.  She also endorses dyspnea on exertion while climbing steps and simultaneously talking on the phone.  She is able to wheeze with strong exhalation; the etiology of this particular symptom is unclear.  I do recommend she have a pulmonary function test done and have ordered it below.

## 2023-06-21 NOTE — Assessment & Plan Note (Addendum)
Patient endorses significant, persistent, worsening difficulty with balance.  On physical exam, she does have difficulty with tandem walk and consistently falls to the right when executing this in a "normal" manner.  However, she states she has figured out how to "cheat" to "pass the test" by keeping her chin elevated when executing the walk, which does actually allow her to do so without falling over.    Patient also exhibited bilateral nystagmus on adduction of eyes from left side back towards central line, after moving them downward, which she did Not exhibit when returning to centerline from upward and to the left.     Patient has previously been evaluated by neurology for problems with cognition and some reported trouble falling in the past, as noted in HPI.  However, this presentation seems to be different from her previous and she endorses that the problem is worsening.  I discussed the possibility of ordering an MRI.  Patient states that she is okay with waiting for her currently scheduled neurology appointment 09/21/2023 to discuss it with the neurologist first.

## 2023-06-21 NOTE — Telephone Encounter (Signed)
Copied from CRM [id number]. Topic: General - Other >> Jun 21, 2023 11:05 AM Turkey B wrote: Reason for CRM: pt called in states she is ok with having a pulmonary test done

## 2023-06-21 NOTE — Assessment & Plan Note (Signed)
Noted based on previous lab work.  Will recheck with labs ordered prior to annual physical exam.

## 2023-06-21 NOTE — Assessment & Plan Note (Addendum)
She believes she was diagnosed with Parkinson's.  However, upon review of the record, she is noted to have symptoms of premotor parkinsonism (from Dr. Margaretmary Eddy note 09/22/2022 "hypophonia, hyposomnia, ageusia, constipation, paraphasic errors, positive syn one test, positive DAT scan, and MRI Brain NeuroQuant showed medial temporal lobe atrophy").  However, he also noted that Dr. Armandina Stammer evaluated her and found no active parkinsonism features.  He also notes the [facility] neuropsychology impression from 02/25/2022 patient mentioned and which described her as having minor relative weaknesses in cognition that are not diagnostic but can be used as a baseline for future assessment.        Dr. Sherryll Burger did check several blood levels in relation to Alzheimer's disease, which did show an abnormal beta amyloid 40/42 ratio of 0.086 (normal >0.102).  According to the information provided on that lab, this ratio indicates a higher probability of the patient being clinically diagnosed with Alzheimer's disease and testing suggest that this level is comparable to traditional cerebrospinal fluid testing and amyloid positron admission tomography scans.  She was contacted by Dr. Artis Flock 10/11/2022 and advised of this.  The blood work done 09/22/2022 also showed that she had 1 copy of APO E3 and 1 copy of APO E4, which is also associated with a slight increase in risk of development of late onset Alzheimer's disease compared to the general population; Dr. Artis Flock also informed her of this on that call.      She will be following up with Dr. Sherryll Burger 09/21/2023.

## 2023-06-26 ENCOUNTER — Other Ambulatory Visit: Payer: Self-pay | Admitting: Family Medicine

## 2023-06-26 DIAGNOSIS — Z1231 Encounter for screening mammogram for malignant neoplasm of breast: Secondary | ICD-10-CM

## 2023-06-27 ENCOUNTER — Ambulatory Visit
Admission: RE | Admit: 2023-06-27 | Discharge: 2023-06-27 | Disposition: A | Discharge status: Home / Self Care | Payer: Medicare Other | Source: Ambulatory Visit | Attending: Family Medicine | Admitting: Family Medicine

## 2023-06-27 DIAGNOSIS — Z1231 Encounter for screening mammogram for malignant neoplasm of breast: Secondary | ICD-10-CM

## 2023-07-18 ENCOUNTER — Ambulatory Visit: Payer: Medicare Other | Attending: Family Medicine

## 2023-07-18 DIAGNOSIS — R058 Other specified cough: Secondary | ICD-10-CM | POA: Insufficient documentation

## 2023-07-18 DIAGNOSIS — R0609 Other forms of dyspnea: Secondary | ICD-10-CM | POA: Insufficient documentation

## 2023-07-18 LAB — PULMONARY FUNCTION TEST ARMC ONLY
DL/VA % pred: 109 %
DL/VA: 4.38 ml/min/mmHg/L
DLCO unc % pred: 92 %
DLCO unc: 20.51 ml/min/mmHg
FEF 25-75 Post: 3.03 L/s
FEF 25-75 Pre: 2.78 L/s
FEF2575-%Change-Post: 9 %
FEF2575-%Pred-Post: 145 %
FEF2575-%Pred-Pre: 132 %
FEV1-%Change-Post: 3 %
FEV1-%Pred-Post: 99 %
FEV1-%Pred-Pre: 96 %
FEV1-Post: 2.61 L
FEV1-Pre: 2.53 L
FEV1FVC-%Change-Post: 1 %
FEV1FVC-%Pred-Pre: 108 %
FEV6-%Change-Post: 1 %
FEV6-%Pred-Post: 94 %
FEV6-%Pred-Pre: 93 %
FEV6-Post: 3.12 L
FEV6-Pre: 3.09 L
FEV6FVC-%Pred-Post: 104 %
FEV6FVC-%Pred-Pre: 104 %
FVC-%Change-Post: 1 %
FVC-%Pred-Post: 90 %
FVC-%Pred-Pre: 89 %
FVC-Post: 3.12 L
FVC-Pre: 3.09 L
Post FEV1/FVC ratio: 84 %
Post FEV6/FVC ratio: 100 %
Pre FEV1/FVC ratio: 82 %
Pre FEV6/FVC Ratio: 100 %
RV % pred: 79 %
RV: 1.92 L
TLC % pred: 89 %
TLC: 5.06 L

## 2023-07-18 MED ORDER — ALBUTEROL SULFATE (2.5 MG/3ML) 0.083% IN NEBU
2.5000 mg | INHALATION_SOLUTION | Freq: Once | RESPIRATORY_TRACT | Status: AC
  Administered 2023-07-18: 2.5 mg via RESPIRATORY_TRACT
  Filled 2023-07-18: qty 3

## 2023-07-19 ENCOUNTER — Other Ambulatory Visit: Payer: Self-pay | Admitting: Family Medicine

## 2023-07-19 ENCOUNTER — Encounter: Payer: Self-pay | Admitting: Family Medicine

## 2023-08-28 ENCOUNTER — Ambulatory Visit: Payer: Medicare Other | Admitting: Family Medicine

## 2023-08-28 ENCOUNTER — Encounter: Payer: Medicare Other | Admitting: Physician Assistant

## 2023-09-01 ENCOUNTER — Encounter: Payer: Self-pay | Admitting: Family Medicine

## 2023-09-01 ENCOUNTER — Ambulatory Visit: Payer: Medicare Other | Admitting: Family Medicine

## 2023-09-01 VITALS — BP 147/69 | HR 59 | Temp 97.3°F | Resp 20 | Ht 68.0 in | Wt 136.6 lb

## 2023-09-01 DIAGNOSIS — Z0000 Encounter for general adult medical examination without abnormal findings: Secondary | ICD-10-CM | POA: Diagnosis not present

## 2023-09-01 DIAGNOSIS — R413 Other amnesia: Secondary | ICD-10-CM | POA: Diagnosis not present

## 2023-09-01 DIAGNOSIS — R2689 Other abnormalities of gait and mobility: Secondary | ICD-10-CM

## 2023-09-01 NOTE — Progress Notes (Signed)
Annual Wellness Visit     Patient: Barbara Gates, Female    DOB: [birth year], 72 y.o.   MRN: [id number] Visit Date: 09/01/2023  Today's Provider: Sherlyn Hay, DO   Chief Complaint  Patient presents with   Annual Exam    Medicare wellness   Subjective    Barbara Gates is a 72 y.o. female who presents today for her Annual Wellness Visit. She reports consuming a  reasonably healthy but with a fair amount of frozen foods  diet.  Silver sneakers, rock steady boxing and push-mows  She generally feels well. She reports sleeping fairly well. She does not have additional problems to discuss today.   HPI  Walking in 60 degrees weather; 10-15 minutes after coming inside, temperature using an oral thermometer read 96 F, then 59F x2 readings.  Estimates her vision at 20/25 with glasses after cataract surgery 3 years ago (worse than before the surgery).  Balance has been better (has not had to be caught) Constipation pretty regularly  Medications: Outpatient Medications Prior to Visit  Medication Sig   Cholecalciferol (VITAMIN D3) 50 MCG (2000 UT) capsule Take 2,000 Units by mouth daily.   Coenzyme Q10 10 MG capsule Take 1 capsule by mouth daily.   levOCARNitine (L-CARNITINE) 500 MG TABS Take 500 mg by mouth daily.   Lifitegrast (XIIDRA OP) Apply to eye.   Lysine 600 MG TABS Take 1 tablet by mouth every other day.   Magnesium 250 MG TABS Take 2 tablets by mouth daily.    Multiple Vitamins-Minerals (OCUVITE-LUTEIN PO) Take 0.5 tablets by mouth daily.   Omega-3 Fatty Acids (FISH OIL) 600 MG CAPS Take 600 mg by mouth daily.   Pediatric Multivit-Minerals-C (ANIMAL CHEWABLE MULTI COMPLETE PO) Take 1 tablet by mouth daily.   PHOSPHATIDYLSERINE PO daily.   Polyethylene Glycol 400 (BLINK TEARS OP) Apply 1 drop to eye in the morning and at bedtime.   senna-docusate (SENOKOT-S) 8.6-50 MG tablet Take 2 tablets by mouth every evening.    tretinoin (RETIN-A) 0.05 % cream Apply  topically at bedtime. Qhs to face for milia   triamcinolone cream (KENALOG) 0.1 % Apply 1 application topically 2 (two) times daily. Bid aa itchy rash on right posterior shoulder until clear, then prn flares, avoid face, groin, axilla   Turmeric 450 MG CAPS Take 550 mg by mouth every other day.   No facility-administered medications prior to visit.    Allergies  Allergen Reactions   Other Hives    selamectin active ingredient in Revolution Plus for cats.   Poison Ivy Extract Itching   Raloxifene Hcl Cough        Systane [Polyethyl Glycol-Propyl Glycol] Itching   Tamoxifen Cough   Wasp Venom Protein Hives   Xiidra [Lifitegrast] Itching   Mango Flavor Itching and Rash    Patient Care Team: Sherlyn Hay, DO as PCP - General (Family Medicine) End, Cristal Deer, MD as PCP - Cardiology (Cardiology) Willeen Niece, MD as Consulting Physician (Dermatology) Salena Saner, MD as Consulting Physician (Pulmonary Disease) Geanie Logan, MD as Referring Physician (Otolaryngology) Lockie Mola, MD as Referring Physician (Ophthalmology) Sallee Lange, MD (Ophthalmology)  Review of Systems  Constitutional:  Negative for chills and fever.       Does feel as though she's colder than is normal at baseline  Respiratory:  Positive for shortness of breath (intermittent, at baseline).   Cardiovascular:  Negative for chest pain.  Gastrointestinal:  Positive for constipation. Negative for diarrhea,  nausea and vomiting.         Objective    Vitals: BP (!) 147/69 (BP Location: Right Arm, Patient Position: Sitting, Cuff Size: Normal)   Pulse (!) 59   Temp (!) 97.3 F (36.3 C)   Resp 20   Ht 5\' 8"  (1.727 m)   Wt 136 lb 9.6 oz (62 kg)   SpO2 100%   BMI 20.77 kg/m      Physical Exam Vitals and nursing note reviewed.  Constitutional:      General: She is not in acute distress.    Appearance: Normal appearance.  HENT:     Head: Normocephalic and atraumatic.  Eyes:      General: No scleral icterus.    Conjunctiva/sclera: Conjunctivae normal.  Cardiovascular:     Rate and Rhythm: Normal rate.  Pulmonary:     Effort: Pulmonary effort is normal.  Neurological:     Mental Status: She is alert and oriented to person, place, and time. Mental status is at baseline.  Psychiatric:        Mood and Affect: Mood normal.        Behavior: Behavior normal.      Most recent functional status assessment:    08/30/2023   11:23 AM  In your present state of health, do you have any difficulty performing the following activities:  Hearing? 0  Vision? 1  Difficulty concentrating or making decisions? 1  Walking or climbing stairs? 0  Dressing or bathing? 0  Doing errands, shopping? 0  Preparing Food and eating ? N  Using the Toilet? N  In the past six months, have you accidently leaked urine? Y  Do you have problems with loss of bowel control? N  Managing your Medications? N  Managing your Finances? N  Housekeeping or managing your Housekeeping? N   Most recent fall risk assessment:    08/30/2023   11:23 AM  Fall Risk   Falls in the past year? 0  Number falls in past yr: 0  Injury with Fall? 0    Most recent depression screenings:    09/01/2023   10:48 AM 06/14/2023    2:28 PM  PHQ 2/9 Scores  PHQ - 2 Score 0 0  PHQ- 9 Score  1   Most recent cognitive screening:    09/01/2023   11:27 AM  6CIT Screen  What Year? 0 points  What month? 0 points  What time? 0 points  Count back from 20 0 points  Months in reverse 0 points  Repeat phrase 0 points  Total Score 0 points   Most recent Audit-C alcohol use screening    08/30/2023   11:23 AM  Alcohol Use Disorder Test (AUDIT)  1. How often do you have a drink containing alcohol? 2  2. How many drinks containing alcohol do you have on a typical day when you are drinking? 0  3. How often do you have six or more drinks on one occasion? 0  AUDIT-C Score 2   A score of 3 or more in women, and 4 or  more in men indicates increased risk for alcohol abuse, EXCEPT if all of the points are from question 1   No results found for any visits on 09/01/23.  Assessment & Plan     Annual wellness visit done today including the all of the following: Reviewed patient's Family Medical History Reviewed and updated list of patient's medical providers Assessment of cognitive impairment was done Assessed patient's  functional ability Established a written schedule for health screening services Health Risk Assessent Completed and Reviewed Patient is not on any opioid medications.  Exercise Activities and Dietary recommendations  Goals   None     Immunization History  Administered Date(s) Administered   Fluad Quad(high Dose 65+) 08/25/2022   Hepatitis A, Adult 09/20/2017, 04/05/2018   Influenza Split 09/06/2011, 09/11/2012   Influenza, High Dose Seasonal PF 10/17/2016, 09/03/2018   Influenza,inj,Quad PF,6+ Mos 09/13/2013, 12/07/2015, 08/23/2017   Influenza-Unspecified 09/13/2013, 12/07/2015, 10/17/2016, 08/23/2017, 08/26/2020, 08/18/2021   PFIZER Comirnaty(Gray Top)Covid-19 Tri-Sucrose Vaccine 08/18/2021   PFIZER(Purple Top)SARS-COV-2 Vaccination 01/03/2020, 01/28/2020, 08/26/2020   Pfizer Covid-19 Vaccine Bivalent Booster 56yrs & up 08/09/2023   Pneumococcal Conjugate-13 10/17/2016   Pneumococcal Polysaccharide-23 10/18/2017   Tdap 09/02/2010, 02/11/2021   Typhoid Inactivated 09/20/2017   Zoster Recombinant(Shingrix) 10/23/2018, 03/20/2019   Zoster, Live 02/05/2014    Health Maintenance  Topic Date Due   INFLUENZA VACCINE  02/19/2024 (Originally 06/22/2023)   COVID-19 Vaccine (6 - 2023-24 season) 10/04/2023   DEXA SCAN  01/19/2024   Colonoscopy  03/21/2024   MAMMOGRAM  06/26/2024   Medicare Annual Wellness (AWV)  08/31/2024   DTaP/Tdap/Td (3 - Td or Tdap) 02/12/2031   Pneumonia Vaccine 60+ Years old  Completed   Hepatitis C Screening  Completed   Zoster Vaccines- Shingrix  Completed    HPV VACCINES  Aged Out     Discussed health benefits of physical activity, and encouraged her to engage in regular exercise appropriate for her age and condition.    Encounter for subsequent annual wellness visit (AWV) in Medicare patient  Balance problem Assessment & Plan: Patient's neurologist will potentially no longer be in network soon and needs a new referral.  Orders: -     Ambulatory referral to Neurology  Memory impairment of gradual onset -     Ambulatory referral to Neurology    Return in about 4 weeks (around 09/29/2023).     I discussed the assessment and treatment plan with the patient  The patient was provided an opportunity to ask questions and all were answered. The patient agreed with the plan and demonstrated an understanding of the instructions.   The patient was advised to call back or seek an in-person evaluation if the symptoms worsen or if the condition fails to improve as anticipated.    Sherlyn Hay, DO  [facility] [phone] (phone) [phone] (fax)  [facility]

## 2023-09-01 NOTE — Assessment & Plan Note (Signed)
Patient's neurologist will potentially no longer be in network soon and needs a new referral.

## 2023-09-19 ENCOUNTER — Ambulatory Visit: Payer: Medicare Other | Admitting: Dermatology

## 2023-09-19 DIAGNOSIS — W908XXA Exposure to other nonionizing radiation, initial encounter: Secondary | ICD-10-CM | POA: Diagnosis not present

## 2023-09-19 DIAGNOSIS — L814 Other melanin hyperpigmentation: Secondary | ICD-10-CM | POA: Diagnosis not present

## 2023-09-19 DIAGNOSIS — L821 Other seborrheic keratosis: Secondary | ICD-10-CM

## 2023-09-19 DIAGNOSIS — D1801 Hemangioma of skin and subcutaneous tissue: Secondary | ICD-10-CM

## 2023-09-19 DIAGNOSIS — Z86018 Personal history of other benign neoplasm: Secondary | ICD-10-CM

## 2023-09-19 DIAGNOSIS — D229 Melanocytic nevi, unspecified: Secondary | ICD-10-CM

## 2023-09-19 DIAGNOSIS — L820 Inflamed seborrheic keratosis: Secondary | ICD-10-CM | POA: Diagnosis not present

## 2023-09-19 DIAGNOSIS — D224 Melanocytic nevi of scalp and neck: Secondary | ICD-10-CM

## 2023-09-19 DIAGNOSIS — Z1283 Encounter for screening for malignant neoplasm of skin: Secondary | ICD-10-CM | POA: Diagnosis not present

## 2023-09-19 DIAGNOSIS — L578 Other skin changes due to chronic exposure to nonionizing radiation: Secondary | ICD-10-CM

## 2023-09-19 DIAGNOSIS — D225 Melanocytic nevi of trunk: Secondary | ICD-10-CM

## 2023-09-19 NOTE — Patient Instructions (Addendum)

## 2023-09-19 NOTE — Progress Notes (Signed)
   Follow-Up Visit   Subjective  Barbara Gates is a 72 y.o. female who presents for the following: Skin Cancer Screening and Full Body Skin Exam  The patient presents for Total-Body Skin Exam (TBSE) for skin cancer screening and mole check. The patient has spots, moles and lesions to be evaluated, some may be new or changing and the patient may have concern these could be cancer.  Spot on right cheek gets irritated.  Patient with hx of dysplastic nevi.  The following portions of the chart were reviewed this encounter and updated as appropriate: medications, allergies, medical history  Review of Systems:  No other skin or systemic complaints except as noted in HPI or Assessment and Plan.  Objective  Well appearing patient in no apparent distress; mood and affect are within normal limits.  A full examination was performed including scalp, head, eyes, ears, nose, lips, neck, chest, axillae, abdomen, back, buttocks, bilateral upper extremities, bilateral lower extremities, hands, feet, fingers, toes, fingernails, and toenails. All findings within normal limits unless otherwise noted below.   Relevant physical exam findings are noted in the Assessment and Plan.  R mid cheek x 1 Erythematous stuck-on, waxy papule or plaque    Assessment & Plan   SKIN CANCER SCREENING PERFORMED TODAY.  ACTINIC DAMAGE - Chronic condition, secondary to cumulative UV/sun exposure - diffuse scaly erythematous macules with underlying dyspigmentation - Recommend daily broad spectrum sunscreen SPF 30+ to sun-exposed areas, reapply every 2 hours as needed.  - Staying in the shade or wearing long sleeves, sun glasses (UVA+UVB protection) and wide brim hats (4-inch brim around the entire circumference of the hat) are also recommended for sun protection.  - Call for new or changing lesions.  LENTIGINES, SEBORRHEIC KERATOSES, HEMANGIOMAS - Benign normal skin lesions - Benign-appearing - Call for any  changes  MELANOCYTIC NEVI - Tan-brown and/or pink-flesh-colored symmetric macules and papules - Right lower flank 4.0 x 2.36mm med dark brown macule  -Central lower abdomen 5.0 x 4.70mm speckled brown macule - Left medial breast flesh papule  - Left post lower neck 5.0 x 3.0 mm adjacent speckled brown macules, lighter medial - Benign appearing on exam today - Observation - Call clinic for new or changing moles - Recommend daily use of broad spectrum spf 30+ sunscreen to sun-exposed areas.     Inflamed seborrheic keratosis R mid cheek x 1  Symptomatic, irritating, patient would like treated.  Benign-appearing.  Call clinic for new or changing lesions.     Destruction of lesion - R mid cheek x 1  Destruction method: cryotherapy   Informed consent: discussed and consent obtained   Lesion destroyed using liquid nitrogen: Yes   Region frozen until ice ball extended beyond lesion: Yes   Outcome: patient tolerated procedure well with no complications   Post-procedure details: wound care instructions given   Additional details:  Prior to procedure, discussed risks of blister formation, small wound, skin dyspigmentation, or rare scar following cryotherapy. Recommend Vaseline ointment to treated areas while healing.    Return in about 1 year (around 09/18/2024) for TBSE, with Dr. Roseanne Reno, Hx Dysplastic Nevi.  Anise Salvo, RMA, am acting as scribe for Willeen Niece, MD .   Documentation: I have reviewed the above documentation for accuracy and completeness, and I agree with the above.  Willeen Niece, MD

## 2023-10-06 ENCOUNTER — Ambulatory Visit: Payer: Medicare Other | Admitting: Family Medicine

## 2023-10-06 ENCOUNTER — Encounter: Payer: Self-pay | Admitting: Family Medicine

## 2023-10-06 VITALS — BP 129/76 | HR 66 | Resp 16 | Ht 68.0 in | Wt 138.6 lb

## 2023-10-06 DIAGNOSIS — R001 Bradycardia, unspecified: Secondary | ICD-10-CM

## 2023-10-06 DIAGNOSIS — E65 Localized adiposity: Secondary | ICD-10-CM

## 2023-10-06 DIAGNOSIS — L603 Nail dystrophy: Secondary | ICD-10-CM

## 2023-10-06 DIAGNOSIS — K5909 Other constipation: Secondary | ICD-10-CM

## 2023-10-06 DIAGNOSIS — R6889 Other general symptoms and signs: Secondary | ICD-10-CM

## 2023-10-06 DIAGNOSIS — H47021 Hemorrhage in optic nerve sheath, right eye: Secondary | ICD-10-CM

## 2023-10-06 NOTE — Progress Notes (Signed)
Established patient visit   Patient: Barbara Gates   DOB: Aug 16, 1951   72 y.o. Female  MRN: [id number] Visit Date: 10/06/2023  Today's healthcare provider: Sherlyn Hay, DO   Chief Complaint  Patient presents with   Follow-up   Subjective    HPI The patient presents with concerns about a low resting heart rate, typically ranging between 50-58 bpm at home. She also reports a recent episode of hemorrhage in the right optic nerve, which is managed by an ophthalmologist. The patient is scheduled for a follow-up visit in six weeks.  The patient has ongoing concern about potential thyroid issues, given her thyroid levels have historically been just outside of normal.. She reports a persistent feeling of coldness, particularly on rainy days, which sometimes prompts her to take naps during the day.  The patient also reports a persistent issue with constipation, which she manages with a combination of Senokot, Senna, Miralax, and magnesium supplements. Despite these measures, bowel movements occur only every other day at most, and often every three to four days.  The patient has a history of high cholesterol, but has discontinued statin medications due to an increase in A1C levels and a negative reaction to Repatha. She has chosen to manage her cholesterol levels through diet and exercise.  The patient also reports a concern about weight gain, particularly in the abdominal area, despite maintaining a thin physique overall. She has noticed ridges in her fingernails, which she suspects may be related to her thyroid concerns.  The patient has a history of a persistent cough and shortness of breath, which was investigated with a CT scan in May of the previous year. The scan revealed small nodules, but the patient has chosen not to pursue further investigation at this time.  The patient is currently managing her Parkinsonian symptoms with phosphatidylserine supplements, which she  believes to be effective. She reports no observable symptoms of Parkinson's disease during her most recent visit with her neurologist.     Medications: Outpatient Medications Prior to Visit  Medication Sig   Cholecalciferol (VITAMIN D3) 50 MCG (2000 UT) capsule Take 2,000 Units by mouth daily.   Coenzyme Q10 10 MG capsule Take 1 capsule by mouth daily.   levOCARNitine (L-CARNITINE) 500 MG TABS Take 500 mg by mouth daily.   Lifitegrast (XIIDRA OP) Apply to eye.   Lysine 600 MG TABS Take 1 tablet by mouth every other day.   Magnesium 250 MG TABS Take 2 tablets by mouth daily.    Multiple Vitamins-Minerals (OCUVITE-LUTEIN PO) Take 0.5 tablets by mouth daily.   Omega-3 Fatty Acids (FISH OIL) 600 MG CAPS Take 600 mg by mouth daily.   Pediatric Multivit-Minerals-C (ANIMAL CHEWABLE MULTI COMPLETE PO) Take 1 tablet by mouth daily.   PHOSPHATIDYLSERINE PO daily.   Polyethylene Glycol 400 (BLINK TEARS OP) Apply 1 drop to eye in the morning and at bedtime.   senna-docusate (SENOKOT-S) 8.6-50 MG tablet Take 2 tablets by mouth every evening.    tretinoin (RETIN-A) 0.05 % cream Apply topically at bedtime. Qhs to face for milia   triamcinolone cream (KENALOG) 0.1 % Apply 1 application topically 2 (two) times daily. Bid aa itchy rash on right posterior shoulder until clear, then prn flares, avoid face, groin, axilla   Turmeric 450 MG CAPS Take 550 mg by mouth every other day.   No facility-administered medications prior to visit.        Objective    BP 129/76 (BP Location:  Left Arm, Patient Position: Sitting, Cuff Size: Normal)   Pulse 66   Resp 16   Ht 5\' 8"  (1.727 m)   Wt 138 lb 9.6 oz (62.9 kg)   SpO2 100%   BMI 21.07 kg/m     Physical Exam Vitals and nursing note reviewed.  Constitutional:      General: She is not in acute distress.    Appearance: Normal appearance.  HENT:     Head: Normocephalic and atraumatic.  Eyes:     General: No scleral icterus.    Conjunctiva/sclera:  Conjunctivae normal.  Cardiovascular:     Rate and Rhythm: Normal rate.  Pulmonary:     Effort: Pulmonary effort is normal.  Skin:    Comments: Vertical nail ridges on all nails and which extend from the bottom of the nail to the tip  Neurological:     Mental Status: She is alert and oriented to person, place, and time. Mental status is at baseline.  Psychiatric:        Mood and Affect: Mood normal.        Behavior: Behavior normal.      Results for orders placed or performed in visit on 10/06/23  TSH+T4F+T3Free  Result Value Ref Range   TSH 2.550 0.450 - 4.500 uIU/mL   T3, Free 2.5 2.0 - 4.4 pg/mL   Free T4 1.23 0.82 - 1.77 ng/dL  T3, reverse  Result Value Ref Range   Reverse T3, Serum 13.6 9.2 - 24.1 ng/dL  T3  Result Value Ref Range   T3, Total 72 71 - 180 ng/dL  Magnesium  Result Value Ref Range   Magnesium 2.2 1.6 - 2.3 mg/dL  Thyroxine binding globulin  Result Value Ref Range   Thyroxine Bind Glob 17 13 - 39 ug/mL    Assessment & Plan    Cold sensitivity Assessment & Plan: Ongoing issue. Will order further evaluation of possible thyroid issue today, given historically mildly abnormal levels, not warranting medication under normal circumstances. Patient will be holding her supplements prior to having her labs drawn.  Orders: -     TSH+T4F+T3Free -     T3, reverse -     T3 -     Thyroxine binding globulin  High magnesium levels Assessment & Plan: Patient has been taking magnesium supplements long-term. Will recheck magnesium level today.  Orders: -     Magnesium  Onychorrhexis Assessment & Plan: Unclear etiology.  Normal aging versus metabolic origin versus dermatologic origin. Further evaluating mildly abnormal thyroid levels today.   Sinus bradycardia on ECG Assessment & Plan: Ongoing issue.  Heart rate consistently 50-58 at home. No associated symptoms reported. -Continue monitoring at home.  Further evaluating thyroid levels today as possible  contributing source.   Chronic constipation Assessment & Plan: Chronic constipation managed with Senokot, Senna, Miralax, and magnesium oxide. Bowel movements every other day to every 3-4 days. -Continue current regimen. Consider adding bidet or wet wipes for improved hygiene.   Abdominal obesity Assessment & Plan: Patient reports increased abdominal girth (38 inches).  Patient also has prediabetes at last check (A1c 5.8) No other metabolic syndrome features reported. -Consider further evaluation if thyroid function tests abnormal.   Optic nerve hemorrhage, right Assessment & Plan: Recent hemorrhage in right optic nerve. Currently under care of ophthalmologist at [facility]. Follow-up in 4 weeks. -Continue follow-up with ophthalmologist as directed.   Lung Nodules Small nodules noted on previous CT scan. No change in chronic cough. -Defer repeat CT  scan per patient preference.  General Health Maintenance -Flu shot received on 09/20/2023.  Return in about 3 months (around 01/06/2024).      I discussed the assessment and treatment plan with the patient  The patient was provided an opportunity to ask questions and all were answered. The patient agreed with the plan and demonstrated an understanding of the instructions.   The patient was advised to call back or seek an in-person evaluation if the symptoms worsen or if the condition fails to improve as anticipated.    Sherlyn Hay, DO  [facility] [phone] (phone) [phone] (fax)  [facility]

## 2023-10-12 ENCOUNTER — Encounter: Payer: Self-pay | Admitting: Family Medicine

## 2023-10-12 LAB — T3: T3, Total: 72 ng/dL (ref 71–180)

## 2023-10-12 LAB — MAGNESIUM: Magnesium: 2.2 mg/dL (ref 1.6–2.3)

## 2023-10-12 LAB — THYROXINE BINDING GLOBULIN: Thyroxine Bind Glob: 17 ug/mL (ref 13–39)

## 2023-10-12 LAB — TSH+T4F+T3FREE
Free T4: 1.23 ng/dL (ref 0.82–1.77)
T3, Free: 2.5 pg/mL (ref 2.0–4.4)
TSH: 2.55 u[IU]/mL (ref 0.450–4.500)

## 2023-10-12 LAB — T3, REVERSE: Reverse T3, Serum: 13.6 ng/dL (ref 9.2–24.1)

## 2023-10-22 ENCOUNTER — Encounter: Payer: Self-pay | Admitting: Family Medicine

## 2023-10-22 DIAGNOSIS — R6889 Other general symptoms and signs: Secondary | ICD-10-CM | POA: Insufficient documentation

## 2023-10-22 DIAGNOSIS — E65 Localized adiposity: Secondary | ICD-10-CM | POA: Insufficient documentation

## 2023-10-22 DIAGNOSIS — H47021 Hemorrhage in optic nerve sheath, right eye: Secondary | ICD-10-CM | POA: Insufficient documentation

## 2023-10-22 DIAGNOSIS — L603 Nail dystrophy: Secondary | ICD-10-CM | POA: Insufficient documentation

## 2023-10-22 NOTE — Assessment & Plan Note (Signed)
Chronic constipation managed with Senokot, Senna, Miralax, and magnesium oxide. Bowel movements every other day to every 3-4 days. -Continue current regimen. Consider adding bidet or wet wipes for improved hygiene.

## 2023-10-22 NOTE — Assessment & Plan Note (Addendum)
Ongoing issue.  Heart rate consistently 50-58 at home. No associated symptoms reported. -Continue monitoring at home.  Further evaluating thyroid levels today as possible contributing source.

## 2023-10-22 NOTE — Assessment & Plan Note (Signed)
Recent hemorrhage in right optic nerve. Currently under care of ophthalmologist at [facility]. Follow-up in 4 weeks. -Continue follow-up with ophthalmologist as directed.

## 2023-10-22 NOTE — Assessment & Plan Note (Signed)
Unclear etiology.  Normal aging versus metabolic origin versus dermatologic origin. Further evaluating mildly abnormal thyroid levels today.

## 2023-10-22 NOTE — Assessment & Plan Note (Addendum)
Patient has been taking magnesium supplements long-term. Will recheck magnesium level today.

## 2023-10-22 NOTE — Assessment & Plan Note (Addendum)
Ongoing issue. Will order further evaluation of possible thyroid issue today, given historically mildly abnormal levels, not warranting medication under normal circumstances. Patient will be holding her supplements prior to having her labs drawn.

## 2023-10-22 NOTE — Assessment & Plan Note (Addendum)
Patient reports increased abdominal girth (38 inches).  Patient also has prediabetes at last check (A1c 5.8) No other metabolic syndrome features reported. -Consider further evaluation if thyroid function tests abnormal.

## 2023-10-25 ENCOUNTER — Encounter: Payer: Self-pay | Admitting: Family Medicine

## 2023-10-25 ENCOUNTER — Ambulatory Visit (INDEPENDENT_AMBULATORY_CARE_PROVIDER_SITE_OTHER): Payer: Medicare Other | Admitting: Family Medicine

## 2023-10-25 VITALS — BP 138/58 | HR 61 | Temp 97.8°F | Wt 137.0 lb

## 2023-10-25 DIAGNOSIS — K5909 Other constipation: Secondary | ICD-10-CM | POA: Diagnosis not present

## 2023-10-25 DIAGNOSIS — N951 Menopausal and female climacteric states: Secondary | ICD-10-CM | POA: Insufficient documentation

## 2023-10-25 MED ORDER — ESTRADIOL 0.1 MG/GM VA CREA
1.0000 | TOPICAL_CREAM | Freq: Every day | VAGINAL | 12 refills | Status: AC
Start: 2023-10-25 — End: ?

## 2023-10-25 NOTE — Progress Notes (Unsigned)
Cardiology Office Note    Date:  10/26/2023   ID:  Barbara Gates, DOB October 06, 1951, MRN [id number]  PCP:  Sherlyn Hay, DO  Cardiologist:  Yvonne Kendall, MD  Electrophysiologist:  None   Chief Complaint: Follow up  History of Present Illness:   Barbara Gates is a 72 y.o. female with history of mild nonobstructive CAD, Parkinson's disease, HLD intolerant to statins and PCSK9 inhibitor, chronic dyspnea with chronic cough, GERD, pulmonary nodules, gait disturbance, and endometriosis who presents for follow-up of her nonobstructive CAD.   She was evaluated in 2021 for shortness of breath.  Prior PFTs in 2020 were unremarkable.  Echo in 2021 demonstrated an EF of 60 to 65%, no regional wall motion abnormalities, mild LVH, grade 1 diastolic dysfunction, and no significant valvular abnormalities.  Symptoms of chronic cough were felt to be attributed to tamoxifen and raloxifene with significant improvement after discontinuation of medications (on medication for breast CA prevention).  Subsequent GXT was intermediate risk with 1 mm of upsloping ST depression in the inferolateral leads at peak stress.  Rare PVCs were noted.  Coronary CTA in 09/2020 showed a calcium score of 76.2 placing her in the 73rd percentile.  She had mild calcified plaque in the proximal to mid LAD estimated at less than 25% stenosis.  She was last seen in the office in 07/2022 and was without symptoms of angina or cardiac decompensation.  Chronic cough and dyspnea were stable and followed by pulmonology.  She was exercising with [employer] and [employer]  She had discontinued aspirin, statin, and PCSK9 inhibitor and preferred to avoid these moving forward.  She comes in doing well from a cardiac perspective and is without symptoms of angina or cardiac decompensation.  She remains active with Silver sneakers and rock steady boxing.  She notes longstanding stable shortness of breath if she is walking up a  flight of stairs and talking at the same time.  Without these 2 activities at the same time she is asymptomatic.  She ambulates approximately 4000 steps per day.  Has concerns regarding cholesterol-lowering medication including statin and PCSK9 inhibitor.  Remains off aspirin.  Has concerns over body temperature.   Labs independently reviewed: 09/2023 - magnesium 2.2, TSH normal 05/2023 - LP(a) 91.1, TC 220, TG 82, HDL 80, LDL 126, A1c 5.8, BUN 14, serum creatinine 0.77, potassium 4.2, albumin 4.6, AST/ALT normal 06/2022 - Hgb 13.1, PLT 277  Past Medical History:  Diagnosis Date   Allergy    specified in earlier questions   Cataract    removed late summer 2021   Dry eye    Dysplastic nevus 08/04/2020   R lat buttock - moderate   Dysplastic nevus 08/23/2022   left central upper abdomen, moderate atypia   Dysplastic nevus 08/23/2022   right central upper abdomen, shave removal 09/28/2022   Dyspnea    with exertion   Endometriosis    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 Johnnette Litter   Hypercalcemia    Hyperlipidemia    Has history of this   Motion sickness    Nodule of vagina    Osteoporosis    Parkinson's disease (HCC) 03/2021   Redundant colon    SOB (shortness of breath)     Past Surgical History:  Procedure Laterality Date   ABDOMINAL HYSTERECTOMY  1994   Total including ovarie. Due to endometriosis   APPENDECTOMY     with hysterectomy    BREAST BIOPSY Left  11/08/2012   Dr. Lemar Livings, neg   CATARACT EXTRACTION W/PHACO Right 05/27/2020   Procedure: CATARACT EXTRACTION PHACO AND INTRAOCULAR LENS PLACEMENT (IOC) RIGHT PANOPTIC LENS;  Surgeon: Lockie Mola, MD;  Location: [facility];  Service: Ophthalmology;  Laterality: Right;  2.14 0:43.8 4.8%   CATARACT EXTRACTION W/PHACO Left 07/22/2020   Procedure: CATARACT EXTRACTION PHACO AND INTRAOCULAR LENS PLACEMENT (IOC) LEFT PANOPTIX LENS 5.31 00:49.1 10.8%;  Surgeon: Lockie Mola, MD;  Location: [facility];  Service: Ophthalmology;  Laterality: Left;   EYE SURGERY  2021   cataracts   granular cell tumor removed     OTHER SURGICAL HISTORY     Surgical Lipoma Removal; 12/13/2012  12/13/2012   VULVAR LESION REMOVAL Left 08/22/2018   Procedure: Excision left vulvar mass, possible sentinel node mapping with blue dye, possible left inguinal lymph node biopsy or dissection;  Surgeon: Leida Lauth, MD;  Location: [facility];  Service: Gynecology;  Laterality: Left;    Current Medications: Current Meds  Medication Sig   Cholecalciferol (VITAMIN D3) 50 MCG (2000 UT) capsule Take 2,000 Units by mouth daily.   Coenzyme Q10 10 MG capsule Take 1 capsule by mouth daily.   estradiol (ESTRACE VAGINAL) 0.1 MG/GM vaginal cream Place 1 Applicatorful vaginally at bedtime. May also be applied directly to vulvar and vestibular tissues. Initial management once daily for 2 weeks. Maintenance: one to three times per week   levOCARNitine (L-CARNITINE) 500 MG TABS Take 500 mg by mouth daily.   Lifitegrast (XIIDRA OP) Apply to eye.   Lysine 600 MG TABS Take 1 tablet by mouth every other day.   Magnesium 250 MG TABS Take 2 tablets by mouth daily.    MAGNESIUM GLYCINATE PO Take by mouth.   Multiple Vitamins-Minerals (CENTRUM SILVER 50+WOMEN) TABS Take by mouth.   Multiple Vitamins-Minerals (OCUVITE-LUTEIN PO) Take 0.5 tablets by mouth daily.   Omega-3 Fatty Acids (FISH OIL) 600 MG CAPS Take 600 mg by mouth daily.   Pediatric Multivit-Minerals-C (ANIMAL CHEWABLE MULTI COMPLETE PO) Take 1 tablet by mouth daily.   PHOSPHATIDYLSERINE PO daily.   Polyethylene Glycol 400 (BLINK TEARS OP) Apply 1 drop to eye in the morning and at bedtime.   senna-docusate (SENOKOT-S) 8.6-50 MG tablet Take 2 tablets by mouth every evening.    Turmeric 450 MG CAPS Take 550 mg by mouth every other day.    Allergies:   Other, Poison ivy extract, Raloxifene hcl, Systane [polyethyl glycol-propyl glycol], Tamoxifen, Wasp venom  protein, Xiidra [lifitegrast], and Mango flavor   Social History   Socioeconomic History   Marital status: Married    Spouse name: Not on file   Number of children: 0   Years of education: Not on file   Highest education level: Bachelor's degree (e.g., BA, AB, BS)  Occupational History   Occupation: retired  Tobacco Use   Smoking status: Never   Smokeless tobacco: Never  Vaping Use   Vaping status: Never Used  Substance and Sexual Activity   Alcohol use: Yes    Alcohol/week: 1.0 standard drink of alcohol    Types: 1 Glasses of wine per week    Comment: 1 glass of wine on Friday.   Drug use: Never   Sexual activity: Not Currently    Birth control/protection: Surgical    Comment: Hysterecetomy  Other Topics Concern   Not on file  Social History Narrative   Not on file   Social Determinants of Health   Financial Resource Strain: Low Risk  (10/05/2023)  Overall Financial Resource Strain (CARDIA)    Difficulty of Paying Living Expenses: Not hard at all  Food Insecurity: No Food Insecurity (10/05/2023)   Hunger Vital Sign    Worried About Running Out of Food in the Last Year: Never true    Ran Out of Food in the Last Year: Never true  Transportation Needs: No Transportation Needs (10/05/2023)   PRAPARE - Administrator, Civil Service (Medical): No    Lack of Transportation (Non-Medical): No  Physical Activity: Sufficiently Active (10/05/2023)   Exercise Vital Sign    Days of Exercise per Week: 6 days    Minutes of Exercise per Session: 50 min  Stress: No Stress Concern Present (10/05/2023)   [employer] of Occupational Health - Occupational Stress Questionnaire    Feeling of Stress : Only a little  Social Connections: Unknown (10/05/2023)   Social Connection and Isolation Panel [NHANES]    Frequency of Communication with Friends and Family: Three times a week    Frequency of Social Gatherings with Friends and Family: More than three times a week     Attends Religious Services: Patient declined    Database administrator or Organizations: Yes    Attends Engineer, structural: More than 4 times per year    Marital Status: Married     Family History:  The patient's family history includes ADD / ADHD in her brother; Alcohol abuse in her brother; Arthritis in her sister; Breast cancer (age of onset: 17) in her sister; CAD in her father; Cancer in her brother, mother, and sister; Diabetes in her brother, brother, father, and mother; Dupuytren's contracture in her sister; Early death in her maternal grandfather and paternal grandmother; Hearing loss in her brother, father, and mother; Heart disease in her father, maternal grandmother, paternal grandfather, and paternal grandmother; Hyperlipidemia in her brother, father, and mother; Hypertension in her brother, father, mother, and sister; Obesity in her brother; Parkinson's disease in her father; Vascular Disease in her father; Vision loss in her mother.  ROS:   12-point review of systems is negative unless otherwise noted in the HPI.   EKGs/Labs/Other Studies Reviewed:    Studies reviewed were summarized above. The additional studies were reviewed today:  Coronary CTA 09/30/2020: Aorta:  Normal size.  No calcifications.  No dissection.   Aortic Valve:  Trileaflet.  No calcifications.   Coronary Arteries:  Normal coronary origin.  Right dominance.   RCA is a large dominant artery that gives rise to PDA and PLA. There is no plaque.   Left main is a large artery that gives rise to LAD and LCX arteries.   LAD is a large vessel that has mild calcified plaque in the proximal to mid segment causing minimal stenosis (<25%).   LCX is a non-dominant artery that gives rise to two obtuse marginal branches. There is no plaque.   Other findings:   Normal pulmonary vein drainage into the left atrium.   Normal left atrial appendage without a thrombus.   Normal size of the pulmonary  artery.   IMPRESSION: 1. Coronary calcium score of 76.2. This was 73rd percentile for age and sex matched control. 2. Normal coronary origin with right dominance. 3. Mild calcified plaque in the proximal to mid LAD causing minimal stenosis (<25%) 4. CAD-RADS 1. Minimal non-obstructive CAD (0-24%). Consider non-atherosclerotic causes of chest pain. Consider preventive therapy and risk factor modification. __________   ETT 09/04/2020: Baseline EKG demonstrates normal sinus rhythm without significant  abnormalities. The patient exhibited fair exercise capacy. She did not experience angina during stress. Heart rate and blood pressure responses were normal. There is 1 mm of upsloping ST depression in the inferolateral leads at peak stress. Rare PVC's were observed during stress. No significant arrhythmia occurred. Duke treadmill score = +2 (intermediate risk)   Mild inferolateral ST depression noted at peak stress with associated shortness of breath.  This is an intermediate risk stress test. __________   2D echo 01/07/2020: 1. Left ventricular ejection fraction, by estimation, is 60 to 65%. The  left ventricle has normal function. The left ventricle has no regional  wall motion abnormalities. There is mild concentric left ventricular  hypertrophy. Left ventricular diastolic  parameters are consistent with Grade I diastolic dysfunction (impaired  relaxation).   2. Right ventricular systolic function is normal. The right ventricular  size is normal.   3. The mitral valve is normal in structure and function. No evidence of  mitral valve regurgitation. No evidence of mitral stenosis.   4. The aortic valve is normal in structure and function. Aortic valve  regurgitation is not visualized. No aortic stenosis is present.   5. The inferior vena cava is normal in size with greater than 50%  respiratory variability, suggesting right atrial pressure of 3 mmHg.   EKG:  EKG is ordered today.   The EKG ordered today demonstrates sinus bradycardia, 55 bpm, nonspecific ST-T changes  Recent Labs: 06/15/2023: ALT 16; BUN 14; Creatinine, Ser 0.77; Potassium 4.2; Sodium 142 10/10/2023: Magnesium 2.2; TSH 2.550  Recent Lipid Panel    Component Value Date/Time   CHOL 220 (H) 06/15/2023 0822   TRIG 82 06/15/2023 0822   HDL 80 06/15/2023 0822   CHOLHDL 2.8 06/15/2023 0822   CHOLHDL 2.6 08/17/2022 0931   VLDL 11 08/17/2022 0931   LDLCALC 126 (H) 06/15/2023 0822   LDLCALC 87 10/19/2017 1012    PHYSICAL EXAM:    VS:  BP 128/78 (BP Location: Left Arm, Patient Position: Sitting, Cuff Size: Normal)   Pulse (!) 55   Ht 5\' 8"  (1.727 m)   Wt 135 lb 12.8 oz (61.6 kg)   SpO2 99%   BMI 20.65 kg/m   BMI: Body mass index is 20.65 kg/m.  Physical Exam Vitals reviewed.  Constitutional:      Appearance: She is well-developed.  HENT:     Head: Normocephalic and atraumatic.  Eyes:     General:        Right eye: No discharge.        Left eye: No discharge.  Neck:     Vascular: No JVD.  Cardiovascular:     Rate and Rhythm: Normal rate and regular rhythm.     Pulses:          Posterior tibial pulses are 2+ on the right side and 2+ on the left side.     Heart sounds: Normal heart sounds, S1 normal and S2 normal. Heart sounds not distant. No midsystolic click and no opening snap. No murmur heard.    No friction rub.  Pulmonary:     Effort: Pulmonary effort is normal. No respiratory distress.     Breath sounds: Normal breath sounds. No decreased breath sounds, wheezing, rhonchi or rales.  Chest:     Chest wall: No tenderness.  Abdominal:     General: There is no distension.  Musculoskeletal:     Cervical back: Normal range of motion.     Right lower leg: No edema.  Left lower leg: No edema.  Skin:    General: Skin is warm and dry.     Nails: There is no clubbing.  Neurological:     Mental Status: She is alert and oriented to person, place, and time.  Psychiatric:         Speech: Speech normal.        Behavior: Behavior normal.        Thought Content: Thought content normal.        Judgment: Judgment normal.     Wt Readings from Last 3 Encounters:  10/26/23 135 lb 12.8 oz (61.6 kg)  10/25/23 137 lb (62.1 kg)  10/06/23 138 lb 9.6 oz (62.9 kg)     ASSESSMENT & PLAN:   Nonobstructive CAD: She is doing well and without symptoms concerning for angina or cardiac decompensation.  She notes longstanding stable exertional shortness of breath that morning, in light of stairs and talking at the same time.  Prior coronary CTA showed nonobstructive disease as outlined above.  She prefers to avoid pharmacotherapy including aspirin, statin, or PCSK9 inhibitor.  She continues to work on lifestyle modification and remains active at baseline.  No indication for further cardiac testing at this time.  HLD: LDL 126 in 05/2023 with target LDL less than 70.  Declines pharmacotherapy.      Disposition: She prefers to f/u with with [facility] on an as needed basis.  Recommend ongoing risk factor modification and primary prevention with her PCP.    Medication Adjustments/Labs and Tests Ordered: Current medicines are reviewed at length with the patient today.  Concerns regarding medicines are outlined above. Medication changes, Labs and Tests ordered today are summarized above and listed in the Patient Instructions accessible in Encounters.   Signed, Eula Listen, PA-C 10/26/2023 12:50 PM     [facility] [place] [address] [place], [place] [address] [phone]

## 2023-10-25 NOTE — Assessment & Plan Note (Signed)
Acute episode on 10/21/23 of constipation, caused by pt titrating down on her well managed constipation regimen and diet intake. Now resolved and no issues at visits. Physical exam of abdominal unremarkable. Pt declined rectal and genitourinary exam. Her Chronic constipation managed with Senokot, Senna, Miralax, and magnesium oxide. Soft Bowel movements every 2-3 days. Continue this. Consider adding bidet or wet wipes for improved hygiene.  Discussed referral to GI for further work up as pt is currently managing with primary care recommendations. Discussed with pt , next steps may include anorectal manometry for ruling out rectal tone dysfunction, also discussed pelvic floor therapy. Pt happy with plan.

## 2023-10-25 NOTE — Progress Notes (Signed)
Acute Office Visit  Introduced to nurse practitioner role and practice setting.  All questions answered.  Discussed provider/patient relationship and expectations.  Subjective:     Patient ID: Barbara Gates, female    DOB: 09-08-1951, 72 y.o.   MRN: [id number]  Chief Complaint  Patient presents with  . Constipation    C/o being constipated, spent all weekend in agony and finally had a BM on Sunday   HPI Acute Constipation Pt presents with acute constipation on Saturday 10/21/23. Pt attributes to diet during thanksgiving and titrating down on her daily preventative constipation regimen.She is now back to have daily stools with her daily regimen. This includes half-cap of miralax every other day, sennakot-colace, magnesium oxide, fiber, prunes, oatmeal, various fruits, and hydration.   States she has history of pelvic floor issues, and with most recent constipation she is concerned for causing prolapse. Which she does not feel like she has today.   She goes on walks and does abdominal massages to help with intestinal movement. She is afraid as she gets older her constipation may worsen. She is also concerned with her genetic history of the alzheimers gene she will end up in nursing home and have horrible constipation. She is hoping for further advice today.   She also mentions having vaginal dryness due to menopause, interested in trying a cream or lubricant.   Denies fever, nausea, vomiting, diarrhea, abdominal pain, or abdominal cramping.   Review of Systems  Constitutional:  Negative for chills, diaphoresis, fever, malaise/fatigue and weight loss.  Gastrointestinal:  Negative for abdominal pain, blood in stool, diarrhea, heartburn, melena, nausea and vomiting.  Genitourinary:  Negative for dysuria, flank pain, frequency, hematuria and urgency.  All other systems reviewed and are negative.      Objective:    BP (!) 138/58 (BP Location: Right Arm, Patient Position:  Sitting, Cuff Size: Normal)   Pulse 61   Temp 97.8 F (36.6 C) (Oral)   Wt 137 lb (62.1 kg)   SpO2 100%   BMI 20.83 kg/m    Physical Exam Vitals reviewed.  Constitutional:      Appearance: Normal appearance. She is normal weight.  HENT:     Head: Normocephalic.  Eyes:     Extraocular Movements: Extraocular movements intact.     Pupils: Pupils are equal, round, and reactive to light.  Cardiovascular:     Rate and Rhythm: Normal rate and regular rhythm.     Pulses: Normal pulses.     Heart sounds: Normal heart sounds.  Abdominal:     General: Abdomen is flat. Bowel sounds are normal. There is no distension.     Palpations: Abdomen is soft. There is no mass.     Tenderness: There is no abdominal tenderness. There is no guarding or rebound.     Hernia: No hernia is present.  Genitourinary:    Comments: Exam offered - pt declined.  Musculoskeletal:     Cervical back: Normal range of motion.  Skin:    General: Skin is warm.     Capillary Refill: Capillary refill takes less than 2 seconds.  Neurological:     General: No focal deficit present.     Mental Status: She is alert and oriented to person, place, and time. Mental status is at baseline.  Psychiatric:        Attention and Perception: Attention normal.        Mood and Affect: Mood is anxious.  Behavior: Behavior is cooperative.        Cognition and Memory: Cognition normal.        Judgment: Judgment normal.   No results found for any visits on 10/25/23.      Assessment & Plan:   Problem List Items Addressed This Visit       Digestive   Chronic constipation - Primary    Acute episode on 10/21/23 of constipation, caused by pt titrating down on her well managed constipation regimen and diet intake. Now resolved and no issues at visits. Physical exam of abdominal unremarkable. Pt declined rectal and genitourinary exam. Her Chronic constipation managed with Senokot, Senna, Miralax, and magnesium oxide. Soft  Bowel movements every 2-3 days. Continue this. Consider adding bidet or wet wipes for improved hygiene.  Discussed referral to GI for further work up as pt is currently managing with primary care recommendations. Discussed with pt , next steps may include anorectal manometry for ruling out rectal tone dysfunction, also discussed pelvic floor therapy. Pt happy with plan.       Relevant Orders   Ambulatory referral to Gastroenterology     Genitourinary   Vaginal dryness, menopausal    Pt mentioned during visit pause menopause having increase dryness. Pelvic exam offered - pt declined. Discussed cream and lubricant options to help improve postmenopausal atrophy, both hormonal and non-hormonal. Pt given list of OTC non-hormonal options, and placed order for estrace if pt decides she would like to try topical estradiol cream.       Relevant Medications   estradiol (ESTRACE VAGINAL) 0.1 MG/GM vaginal cream      Meds ordered this encounter  Medications  . estradiol (ESTRACE VAGINAL) 0.1 MG/GM vaginal cream    Sig: Place 1 Applicatorful vaginally at bedtime. May also be applied directly to vulvar and vestibular tissues. Initial management once daily for 2 weeks. Maintenance: one to three times per week    Dispense:  42.5 g    Refill:  12    No follow-ups on file.  Sallee Provencal, FNP

## 2023-10-25 NOTE — Assessment & Plan Note (Signed)
Pt mentioned during visit pause menopause having increase dryness. Pelvic exam offered - pt declined. Discussed cream and lubricant options to help improve postmenopausal atrophy, both hormonal and non-hormonal. Pt given list of OTC non-hormonal options, and placed order for estrace if pt decides she would like to try topical estradiol cream.

## 2023-10-26 ENCOUNTER — Encounter: Payer: Self-pay | Admitting: Physician Assistant

## 2023-10-26 ENCOUNTER — Ambulatory Visit: Payer: Medicare Other | Attending: Physician Assistant | Admitting: Physician Assistant

## 2023-10-26 VITALS — BP 128/78 | HR 55 | Ht 68.0 in | Wt 135.8 lb

## 2023-10-26 DIAGNOSIS — I2510 Atherosclerotic heart disease of native coronary artery without angina pectoris: Secondary | ICD-10-CM | POA: Diagnosis not present

## 2023-10-26 DIAGNOSIS — E785 Hyperlipidemia, unspecified: Secondary | ICD-10-CM

## 2023-10-26 NOTE — Patient Instructions (Signed)
Medication Instructions:  No changes *If you need a refill on your cardiac medications before your next appointment, please call your pharmacy*   Lab Work: None ordered If you have labs (blood work) drawn today and your tests are completely normal, you will receive your results only by: MyChart Message (if you have MyChart) OR A paper copy in the mail If you have any lab test that is abnormal or we need to change your treatment, we will call you to review the results.   Testing/Procedures: None ordered   Follow-Up: At [facility], you and your health needs are our priority.  As part of our continuing mission to provide you with exceptional heart care, we have created designated Provider Care Teams.  These Care Teams include your primary Cardiologist (physician) and Advanced Practice Providers (APPs -  Physician Assistants and Nurse Practitioners) who all work together to provide you with the care you need, when you need it.  We recommend signing up for the patient portal called "MyChart".  Sign up information is provided on this After Visit Summary.  MyChart is used to connect with patients for Virtual Visits (Telemedicine).  Patients are able to view lab/test results, encounter notes, upcoming appointments, etc.  Non-urgent messages can be sent to your provider as well.   To learn more about what you can do with MyChart, go to [email or web address].    Your next appointment:   Follow up as needed  

## 2023-10-31 ENCOUNTER — Telehealth: Payer: Self-pay | Admitting: *Deleted

## 2023-10-31 NOTE — Telephone Encounter (Signed)
I called the patient and scheduled an appointment. I sent out an appointment reminder with a No-Show letter and a map. I contacted the patient to verify and update the information in her chart, including the insurance guarantor and other relevant details. Patient is aware of her $30 copay.

## 2023-10-31 NOTE — Telephone Encounter (Signed)
This patient was transferred to my phone and she left a voicemail.  In her message, patient stated that she a GI referral. When I went looked in her chart, the referral is for chronic constipation.  Please call patient to schedule an appointment. Noted, patient was seen by [facility] but that was back 5 years ago.

## 2023-11-01 NOTE — Telephone Encounter (Signed)
okay

## 2023-11-29 NOTE — Progress Notes (Signed)
 Ellouise Console, PA-C [address]  [address]  [place], [place] [address]  Main: [phone]  Fax: [phone]   Gastroenterology Consultation  Referring Provider:     Wellington Curtis LABOR, FNP Primary Care Physician:  Donzella Lauraine SAILOR, DO Primary Gastroenterologist:  Ellouise Console, PA-C  Reason for Consultation:     Chronic Constipation        HPI:   Barbara Gates is a 73 y.o. y/o female referred for consultation & management  by Donzella Lauraine SAILOR, DO.    Patient saw her PCP in December to evaluate constipation for 1 week.  Started taking half capful MiraLAX every other day and Senokot-Colace with benefit.  Also took magnesium oxide, fiber, prunes, oatmeal, fruits, and hydration which helped.  She has history of pelvic floor issues and is concerned about prolapse.  She denies fever, chills, nausea, vomiting, diarrhea, abdominal pain, or rectal bleeding.  She has history of chronic lifelong constipation since childhood.  Had previous GI evaluation for constipation through [facility] in 2018 which included anorectal manometry, pelvic floor physical therapy, and biofeedback.  She reports having constipation since childhood.  She denies abdominal pain, rectal bleeding, weight loss, or alarm symptoms.  MiraLAX and fiber have worked well for her for many years.  She is concerned about long-term side effects.  Last colonoscopy 04/2014 through [facility] clinic was a apparently normal.  Report unavailable.  Patient states she has a torturous colon and propofol  sedation was used.  She denies history of colon polyps.  She states 10-year repeat colonoscopy was recommended.  Denies family history of colon cancer.  01/2004 colonoscopy: One 4cm sessile polyp removed from rectum.  Pathology unavailable.   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 Mancil   Hypercalcemia    Hyperlipidemia    Has history of this   Motion sickness    Nodule of vagina    Osteoporosis    Parkinson's disease (HCC) 03/2021   Redundant colon    SOB (shortness of breath)     Past Surgical History:  Procedure Laterality Date   ABDOMINAL HYSTERECTOMY  1994   Total including ovarie. Due to endometriosis   APPENDECTOMY     with hysterectomy    BREAST BIOPSY Left 11/08/2012   Dr. Dessa, neg   CATARACT EXTRACTION W/PHACO Right 05/27/2020   Procedure: CATARACT EXTRACTION PHACO AND INTRAOCULAR LENS PLACEMENT (IOC) RIGHT PANOPTIC LENS;  Surgeon: Mittie Gaskin, MD;  Location: [facility];  Service: Ophthalmology;  Laterality: Right;  2.14 0:43.8 4.8%   CATARACT EXTRACTION W/PHACO Left 07/22/2020   Procedure: CATARACT EXTRACTION PHACO AND INTRAOCULAR LENS PLACEMENT (IOC) LEFT PANOPTIX LENS 5.31 00:49.1 10.8%;  Surgeon: Mittie Gaskin, MD;  Location: [facility];  Service: Ophthalmology;  Laterality: Left;   EYE SURGERY  2021   cataracts   granular cell tumor removed     OTHER SURGICAL HISTORY     Surgical Lipoma Removal; 12/13/2012  12/13/2012   VULVAR LESION REMOVAL Left 08/22/2018  Procedure: Excision left vulvar mass, possible sentinel node mapping with blue dye, possible left inguinal lymph node biopsy or dissection;  Surgeon: Mancil Barter, MD;  Location: [facility];  Service: Gynecology;  Laterality: Left;    Prior to Admission medications   Medication Sig Start Date End Date Taking? Authorizing Provider  Cholecalciferol (VITAMIN D3) 50 MCG (2000 UT) capsule Take 2,000 Units by mouth daily.    [provider]  Coenzyme Q10 10 MG capsule Take 1 capsule by mouth  daily.    [provider]  estradiol  (ESTRACE  VAGINAL) 0.1 MG/GM vaginal cream Place 1 Applicatorful vaginally at bedtime. May also be applied directly to vulvar and vestibular tissues. Initial management once daily for 2 weeks. Maintenance: one to three times per week 10/25/23   Wellington Beams A, FNP  levOCARNitine (L-CARNITINE) 500 MG TABS Take 500 mg by mouth daily.    [provider]  Lifitegrast CARON OP) Apply to eye.    [provider]  Lysine 600 MG TABS Take 1 tablet by mouth every other day.    [provider]  Magnesium 250 MG TABS Take 2 tablets by mouth daily.     [provider]  MAGNESIUM GLYCINATE PO Take by mouth.    [provider]  Multiple Vitamins-Minerals (CENTRUM SILVER 50+WOMEN) TABS Take by mouth.    [provider]  Multiple Vitamins-Minerals (OCUVITE-LUTEIN PO) Take 0.5 tablets by mouth daily.    [provider]  Omega-3 Fatty Acids (FISH OIL) 600 MG CAPS Take 600 mg by mouth daily.    [provider]  Pediatric Multivit-Minerals-C (ANIMAL CHEWABLE MULTI COMPLETE PO) Take 1 tablet by mouth daily.    [provider]  PHOSPHATIDYLSERINE PO daily.    [provider]  Polyethylene Glycol 400 (BLINK TEARS OP) Apply 1 drop to eye in the morning and at bedtime.    [provider]  senna-docusate (SENOKOT-S) 8.6-50 MG tablet Take 2 tablets by mouth every evening.     [provider]  Turmeric 450 MG CAPS Take 550 mg by mouth every other day.    [provider]    Family History  Problem Relation Age of Onset   Cancer Mother        lung, pancreas, melanoma, carcinoid tumors   Hypertension Mother    Hyperlipidemia Mother    Diabetes Mother    Hearing loss Mother    Vision loss Mother    Hypertension Father    Diabetes Father    Hyperlipidemia Father    Parkinson's disease Father    CAD Father    Vascular Disease Father        hardening of  the arteries   Hearing loss Father    Heart disease Father    Diabetes Brother    Hypertension Brother    ADD / ADHD Brother    Alcohol abuse Brother    Cancer Brother    Breast cancer Sister 80   Arthritis Sister    Hypertension Sister    Dupuytren's contracture Sister    Cancer Sister    Early death Maternal Grandfather    Heart disease Maternal Grandmother    Heart disease Paternal Grandfather    Early death Paternal Grandmother    Heart disease Paternal Grandmother    Diabetes Brother    Hearing loss Brother    Hyperlipidemia Brother    Obesity Brother      Social History   Tobacco Use   Smoking status:  Never   Smokeless tobacco: Never  Vaping Use   Vaping status: Never Used  Substance Use Topics   Alcohol use: Yes    Alcohol/week: 1.0 standard drink of alcohol    Types: 1 Glasses of wine per week    Comment: 1 glass of wine on Friday.   Drug use: Never    Allergies as of 11/30/2023 - Review Complete 11/30/2023  Allergen Reaction Noted   Other Hives 10/18/2017   Poison ivy extract Itching 03/11/2022   Raloxifene  hcl Cough 10/29/2019   Systane [polyethyl glycol-propyl glycol] Itching 11/04/2020   Tamoxifen Cough 08/08/2018   Wasp venom protein Hives 03/11/2022   Xiidra [lifitegrast] Itching 11/04/2020   Mango flavoring agent (non-screening) Itching and Rash 08/08/2018    Review of Systems:    All systems reviewed and negative except where noted in HPI.   Physical Exam:  BP 137/74   Pulse 61   Temp 98 F (36.7 C)   Ht 5' 8 (1.727 m)   Wt 139 lb 6.4 oz (63.2 kg)   BMI 21.20 kg/m  No LMP recorded. Patient has had a hysterectomy.  General:   Alert,  Well-developed, well-nourished, pleasant and cooperative in NAD Lungs:  Respirations even and unlabored.  Clear throughout to auscultation.   No wheezes, crackles, or rhonchi. No acute distress. Heart:  Regular rate and rhythm; no murmurs, clicks, rubs, or gallops. Abdomen:  Normal bowel sounds.  No  bruits.  Soft, and non-distended without masses, hepatosplenomegaly or hernias noted.  No Tenderness.  No guarding or rebound tenderness.    Neurologic:  Alert and oriented x3;  grossly normal neurologically. Psych:  Alert and cooperative. Normal mood and affect.  Imaging Studies: No results found.  Assessment and Plan:   RIYANNA CRUTCHLEY is a 73 y.o. y/o female has been referred for:  1.  Chronic lifelong constipation -improved and stable on current treatment.  Continue High Fiber diet with fruits, vegetables, and whole grains. Recommend 30 grams of fiber daily. Drink 64 ounces of Fluids Daily. Continue Miralax Mix 1/2 to 1 capful in a drink daily. Adjust dose of Miralax based on bowel habits. Start Psyllium Husk Fiber 1-2 Tablespoons in a drink daily. Continue Stool Softener and Magnesium Daily. If Constipation worsens, consider Rx Linzess, Amitiza or Trulance.  2.  Colon cancer screening  10-year repeat screening colonoscopy will be due 04/2024.  She will need 2 day prep and propofol  sedation.  Last Colonoscopy was 04/2014. Plan to repeat a 10 year Screening Colonoscopy 04/2024  Follow up 04/2024 for Repeat Colonoscopy.  Ellouise Console, PA-C

## 2023-11-30 ENCOUNTER — Ambulatory Visit: Payer: Medicare Other | Admitting: Physician Assistant

## 2023-11-30 ENCOUNTER — Encounter: Payer: Self-pay | Admitting: Physician Assistant

## 2023-11-30 VITALS — BP 137/74 | HR 61 | Temp 98.0°F | Ht 68.0 in | Wt 139.4 lb

## 2023-11-30 DIAGNOSIS — Z1211 Encounter for screening for malignant neoplasm of colon: Secondary | ICD-10-CM

## 2023-11-30 DIAGNOSIS — K5909 Other constipation: Secondary | ICD-10-CM

## 2023-11-30 DIAGNOSIS — K5904 Chronic idiopathic constipation: Secondary | ICD-10-CM

## 2023-11-30 NOTE — Patient Instructions (Signed)
 Nice to meet you today!  Continue High Fiber diet with fruits, vegetables, and whole grains. Recommend 30 grams of fiber daily. Drink 64 ounces of Fluids Daily.  Continue Miralax Mix 1/2 to 1 capful in a drink daily. Adjust dose of Miralax based on bowel habits. Start Psyllium Husk Fiber 1-2 Tablespoons in a drink daily. Continue Stool Softener and Magnesium Daily.  If no improvement, consider Rx Linzess, Amitiza or Trulance.   Your last Colonoscopy was 04/2014. Plan to repeat a 10 year Screening Colonoscopy 04/2024 - our office will contact you.

## 2023-12-07 ENCOUNTER — Ambulatory Visit: Payer: Medicare Other | Admitting: Physician Assistant

## 2024-01-23 ENCOUNTER — Ambulatory Visit: Payer: Self-pay | Admitting: Family Medicine

## 2024-01-26 LAB — HM DEXA SCAN

## 2024-02-23 ENCOUNTER — Encounter: Payer: Self-pay | Admitting: Family Medicine

## 2024-02-23 ENCOUNTER — Ambulatory Visit: Admitting: Family Medicine

## 2024-02-23 VITALS — BP 133/63 | HR 60 | Temp 98.4°F | Ht 68.0 in | Wt 140.3 lb

## 2024-02-23 DIAGNOSIS — N182 Chronic kidney disease, stage 2 (mild): Secondary | ICD-10-CM

## 2024-02-23 DIAGNOSIS — R053 Chronic cough: Secondary | ICD-10-CM

## 2024-02-23 DIAGNOSIS — Z789 Other specified health status: Secondary | ICD-10-CM | POA: Diagnosis not present

## 2024-02-23 DIAGNOSIS — M65331 Trigger finger, right middle finger: Secondary | ICD-10-CM

## 2024-02-23 DIAGNOSIS — N3946 Mixed incontinence: Secondary | ICD-10-CM

## 2024-02-23 DIAGNOSIS — G20C Parkinsonism, unspecified: Secondary | ICD-10-CM

## 2024-02-23 DIAGNOSIS — Z09 Encounter for follow-up examination after completed treatment for conditions other than malignant neoplasm: Secondary | ICD-10-CM

## 2024-02-23 DIAGNOSIS — M810 Age-related osteoporosis without current pathological fracture: Secondary | ICD-10-CM

## 2024-02-23 DIAGNOSIS — E785 Hyperlipidemia, unspecified: Secondary | ICD-10-CM

## 2024-02-23 NOTE — Progress Notes (Signed)
 Established patient visit   Patient: Barbara Gates   DOB: 09/04/1951   73 y.o. Female  MRN: [id number] Visit Date: 02/23/2024  Today's healthcare provider: Carlean Charter, DO   Chief Complaint  Patient presents with   Follow-up    3 month follow up   Subjective    HPI [name] "[name]" is a 73 year old female with Parkinson's disease who presents with multiple concerns including urinary incontinence, hand stiffness, and visual disturbances.  She experiences urinary incontinence, needing to urinate every two hours and experiencing leakage if she does not go. She does not always feel the urge to urinate until she stands up, leading to accidents. She has not been performing Kegel exercises regularly and is unsure if she is doing them correctly.   She has hand stiffness, particularly in one finger, described as a 'trigger finger.' This stiffness occurs during activities such as exercise and kayaking, where she has difficulty opening her hand. There is no associated pain. Her sister has had similar issues and underwent surgery for it. Her family history includes a grandmother with a finger contracture, which the patient believes was a Dupuytren's contracture.  She has noticed visual disturbances, including seeing objects in incorrect colors, such as a pink cat and a black shirt appearing brown. These episodes have occurred twice recently and seem to be related to changes in lighting conditions (bright to darker or dark to bright).  She has a history of a persistent cough that worsens when lying down, and she has seen multiple pulmonologists without a definitive diagnosis. She also reports a metallic sound in her ears, described as tinnitus. She experiences shortness of breath after exertion. Previous evaluations by pulmonologists and ENT have not identified a clear cause. There is a possibility of laryngeal irritation or reflux contributing to symptoms.  She has a history  of osteoporosis and has been on various treatments including tamoxifen, raloxifene, and Prolia. She has concerns about the side effects of these medications, particularly jaw necrosis associated with Prolia.  She most recently received a Reclast infusion (03/15/2023) and will be due for follow-up with repeat DEXA scan 2 years from that infusion.  She is not currently taking calcium supplements due to concerns about cardiovascular risks. She participates in exercise programs such as Silver [employer]neakers and [employer], which she finds beneficial for flexibility.  She takes several supplements, including vitamin D3, Coenzyme Q10, and vitamin B1. She has been taking vitamin D3 for years and is currently on a reduced dose due to additional sources in her diet. She is considering her vitamin and supplement intake, including vitamin D3, fish oil, and multivitamins. She is concerned about the high B12 content in some supplements and the potential cardiovascular risks of fish oil.      Medications: Outpatient Medications Prior to Visit  Medication Sig Note   Cholecalciferol (VITAMIN D3) 50 MCG (2000 UT) capsule Take 2,000 Units by mouth daily.    Coenzyme Q10 10 MG capsule Take 1 capsule by mouth daily.    denosumab (PROLIA) 60 MG/ML SOSY injection Inject 60 mg into the skin every 6 (six) months.    estradiol (ESTRACE VAGINAL) 0.1 MG/GM vaginal cream Place 1 Applicatorful vaginally at bedtime. May also be applied directly to vulvar and vestibular tissues. Initial management once daily for 2 weeks. Maintenance: one to three times per week    levOCARNitine (L-CARNITINE) 500 MG TABS Take 500 mg by mouth daily.    Lysine  600 MG TABS Take 1 tablet by mouth every other day.    Magnesium 250 MG TABS Take 2 tablets by mouth daily.     MAGNESIUM GLYCINATE PO Take by mouth.    Multiple Vitamins-Minerals (CENTRUM SILVER 50+WOMEN) TABS Take by mouth.    Multiple Vitamins-Minerals (OCUVITE-LUTEIN PO) Take 0.5  tablets by mouth daily.    Omega-3 Fatty Acids (FISH OIL) 600 MG CAPS Take 600 mg by mouth daily.    Pediatric Multivit-Minerals-C (ANIMAL CHEWABLE MULTI COMPLETE PO) Take 1 tablet by mouth daily.    PHOSPHATIDYLSERINE PO daily.    Polyethylene Glycol 400 (BLINK TEARS OP) Apply 1 drop to eye in the morning and at bedtime.    senna-docusate (SENOKOT-S) 8.6-50 MG tablet Take 2 tablets by mouth every evening.     thiamine (VITAMIN B-1) 50 MG tablet Take 50 mg by mouth daily.    tretinoin (RETIN-A) 0.05 % cream Apply topically at bedtime.    triamcinolone cream (KENALOG) 0.1 % Apply 1 Application topically 2 (two) times daily.    Turmeric 450 MG CAPS Take 550 mg by mouth every other day.    [DISCONTINUED] bisacodyl (DULCOLAX) 5 MG EC tablet Take 5 mg by mouth daily as needed for moderate constipation. 02/23/2024: GI   [DISCONTINUED] Lifitegrast (XIIDRA OP) Apply to eye.    No facility-administered medications prior to visit.        Objective    BP 133/63   Pulse 60   Temp 98.4 F (36.9 C)   Ht 5\' 8"  (1.727 m)   Wt 140 lb 4.8 oz (63.6 kg)   SpO2 100%   BMI 21.33 kg/m     Physical Exam Vitals and nursing note reviewed.  Constitutional:      General: She is not in acute distress.    Appearance: Normal appearance.  HENT:     Head: Normocephalic and atraumatic.  Eyes:     General: No scleral icterus.    Conjunctiva/sclera: Conjunctivae normal.  Cardiovascular:     Rate and Rhythm: Normal rate.  Pulmonary:     Effort: Pulmonary effort is normal.  Musculoskeletal:       Arms:  Neurological:     Mental Status: She is alert and oriented to person, place, and time. Mental status is at baseline.  Psychiatric:        Mood and Affect: Mood normal.        Behavior: Behavior normal.      No results found for any visits on 02/23/24.  Assessment & Plan    Primary parkinsonism [facility])  Follow-up exam, 3-6 months since previous exam  Statin intolerance  Trigger middle finger of  right hand  Mixed stress and urge urinary incontinence  CKD (chronic kidney disease) stage 2, GFR 60-89 ml/min  Hyperlipidemia, unspecified hyperlipidemia type  Chronic cough  Age-related osteoporosis without current pathological fracture     Parkinson's Disease; mixed stress and urge urinary incontinence Parkinson's disease with anosmia, constipation, and mixed urinary incontinence managed with exercise and supplements. No levodopa therapy needed. Phosphatidylserine and vitamin B1 discussed. - Continue exercise regimen including [employer] boxing and Silver Sneakers. - Consider Kegel exercises for urinary incontinence. - Continue phosphatidylserine supplement. - Consider continuation of vitamin B1 supplementation. - Continue to follow with Dr. Mason Sole (neurology); defer to specialist management.  Trigger Finger Trigger finger in third finger of right hand with potential progression to locking. Family history of Dupuytren's contracture. Injection therapy options discussed.  Patient feels it is tolerable without intervention  at this time. - Consider referral to orthopedics for injection therapy if symptoms worsen.  Chronic Cough and Dyspnea Chronic cough and dyspnea possibly due to laryngeal irritation or reflux. Previous normal pulmonary function tests. Proton pump inhibitor and dietary modifications discussed. - Consider trial of proton pump inhibitor. - Consider dietary modifications to reduce reflux symptoms.  Chronic Kidney Disease Stage 2 Chronic kidney disease stage 2. Advised to avoid nephrotoxic medications and manage blood pressure, cholesterol, and blood sugar. - Avoid NSAIDs and other nephrotoxic drugs. - Monitor blood pressure, cholesterol, and blood sugar levels.  Hyperlipidemia Hyperlipidemia with intolerance to statins and Repatha. Bempedoic acid discussed as alternative treatment. - Consider trial of bempedoic acid for cholesterol management.  Osteoporosis, most  recent DEXA showing osteopenia status post treatment Osteopenia with previous treatment including tamoxifen, raloxifene, and Reclast. Concerns about side effects and risk of hip fracture discussed. - Schedule follow-up DEXA scan in 02/2025. - Consider continuation of Reclast infusion.  General Health Maintenance Concerns about high B12 levels in multivitamins and vitamin D adequacy. Decision to discontinue fish oil due to saturated fat content. - Continue multivitamin regimen with attention to B12 content. - Consider checking vitamin D levels post-mowing season. - Discontinue fish oil supplement.    Follow-up Routine follow-up and monitoring of chronic conditions. - Schedule annual exam in October. - Follow up with any new or worsening symptoms.  Return in about 6 months (around 09/02/2024) for mAWV.      I discussed the assessment and treatment plan with the patient  The patient was provided an opportunity to ask questions and all were answered. The patient agreed with the plan and demonstrated an understanding of the instructions.   The patient was advised to call back or seek an in-person evaluation if the symptoms worsen or if the condition fails to improve as anticipated.    Carlean Charter, DO  [facility] [phone] (phone) [phone] (fax)  [facility]

## 2024-03-05 ENCOUNTER — Encounter: Payer: Self-pay | Admitting: Family Medicine

## 2024-03-20 ENCOUNTER — Telehealth: Payer: Self-pay

## 2024-03-20 DIAGNOSIS — E785 Hyperlipidemia, unspecified: Secondary | ICD-10-CM

## 2024-03-20 DIAGNOSIS — Z789 Other specified health status: Secondary | ICD-10-CM

## 2024-03-20 NOTE — Telephone Encounter (Signed)
 Copied from CRM [id number]. Topic: Clinical - Medication Question >> Mar 20, 2024  9:41 AM Dorthula Gavel H wrote: Reason for CRM: pt called in stating she is ready to try bempedoic acid, which provider suggested at last visit. Would like it sent to pharmacy.

## 2024-03-22 DIAGNOSIS — Z789 Other specified health status: Secondary | ICD-10-CM | POA: Insufficient documentation

## 2024-03-22 MED ORDER — BEMPEDOIC ACID 180 MG PO TABS: 1.0000 | ORAL_TABLET | Freq: Every day | ORAL | 2 refills | Status: DC

## 2024-04-04 ENCOUNTER — Ambulatory Visit: Payer: Self-pay | Admitting: *Deleted

## 2024-04-04 NOTE — Telephone Encounter (Signed)
 Copied from CRM [id number]. Topic: Clinical - Red Word Triage >> Apr 04, 2024  8:41 AM Juluis Ok wrote: [facility] that prompted transfer to Nurse Triage: Tick bite w/ redness, swelling- Groin Reason for Disposition  [1] Red or very tender (to touch) area AND [2] started over 24 hours after the bite  Answer Assessment - Initial Assessment Questions 1. ATTACHED:  "Is the tick still on the skin?"  (e.g., yes, no, unsure)     I've already pulled it off.  I have a tick bite.   This one is itching.  I put Neosporin on it.   It's still itching, swollen size of my little finger tip.    Today it's not as bad but still itching.     2. ONSET - TICK STILL ATTACHED:  "How long do you think the tick has been on your skin?" (e.g., hours, days, unsure)  Note:  Is there a recent activity (camping, hiking) where the caller may have been exposed?     I got bit on Monday evening.  The bite is the top of my leg on the inside close to my groin on left side.    It's red, swollen, and itching.   It's still red but it's fading red.   I still feel the lump but it's not as big as it was. 3. ONSET - TICK NOT STILL ATTACHED: "If the tick has been removed, how long do you think the tick was attached before you removed it?" (e.g., 5 hours, 2 days). "When was this?"     No   See above 4. LOCATION: "Where is the tick bite located?" (e.g., arm, leg)     See above  5. TYPE of TICK: "Is it a wood tick or a deer tick?" (e.g., deer tick, wood tick; unsure)     It was not engorged.     6. SIZE of TICK: "How big is the tick?" (e.g., size of poppy seed, apple seed, watermelon seed; unsure) Note: Deer ticks can be the size of a poppy seed (nymph) or an apple seed (adult).       It was a little larger than a pin head.   It was flat.  7. ENGORGED: "Did the tick look flat or engorged (full, swollen)?" (e.g., flat, engorged; unsure)     No 8. OTHER SYMPTOMS: "Do you have any other symptoms?" (e.g., fever, rash, redness at bite area, red  ring around bite)     See above 9. PREGNANCY: "Is there any chance you are pregnant?" "When was your last menstrual period?"     N/A  Protocols used: Tick Bite-A-AH  Chief Complaint: Tick bite on top of left thigh on the inside next to my groin. Symptoms: Red, swollen, itching real bad with a knot.   Getting better but concerned about the tick diseases. Frequency: Found it Monday evening. Pertinent Negatives: Patient denies bullseye ring, no fever or body aches, no drainage from bite.    It's actually getting better. Disposition: [] ED /[] Urgent Care (no appt availability in office) / [x] Appointment(In office/virtual)/ []  [facility]/ [] Home Care/ [] Refused Recommended Disposition /[] [facility]/ []  Follow-up with PCP Additional Notes: Appt made for 04/05/2024 with Dr. Athena Bland.

## 2024-04-05 ENCOUNTER — Ambulatory Visit: Admitting: Family Medicine

## 2024-04-05 ENCOUNTER — Encounter: Payer: Self-pay | Admitting: Family Medicine

## 2024-04-05 VITALS — BP 138/70 | HR 58 | Ht 68.0 in | Wt 140.1 lb

## 2024-04-05 DIAGNOSIS — W57XXXA Bitten or stung by nonvenomous insect and other nonvenomous arthropods, initial encounter: Secondary | ICD-10-CM

## 2024-04-05 DIAGNOSIS — A6920 Lyme disease, unspecified: Secondary | ICD-10-CM

## 2024-04-05 MED ORDER — DOXYCYCLINE HYCLATE 100 MG PO TABS
100.0000 mg | ORAL_TABLET | Freq: Two times a day (BID) | ORAL | 0 refills | Status: DC
Start: 2024-04-05 — End: 2024-08-02

## 2024-04-05 NOTE — Progress Notes (Signed)
 Established patient visit   Patient: Barbara Gates   DOB: [birth year]   73 y.o. Female  MRN: [id number] Visit Date: 04/05/2024  Today's healthcare provider: Carlean Charter, DO   Chief Complaint  Patient presents with   Tick Bite    Tick bite w/ redness, itching, swelling in groin X Monday evening. Patient reports she has removed the tick. She reports using neosporin and it is the size of her little finger tip. Pt reports it is not as inflammed as it was when calling in but would like to be sure of no lyme disease or anything else and see about any forms of treatment   Subjective    HPI Barbara Gates "[name]" is a 73 year old female who presents with a tick bite and concerns about potential Lyme disease.  She found a tick on her body, which she removed but is unsure if the head was completely extracted. The tick was located on her skin, and she noticed lingering redness, itching, and swelling at the site. She is uncertain about how long the tick was attached but mentioned that it was burrowed enough to cause itching.  She has a family history of tick-borne illnesses, with her cousin having Lyme disease and her cousin's son having alpha-gal syndrome. This has increased her awareness and concern about tick bites.  [place] but has not experienced significant issues with ticks in the past. No joint pain except for some tenderness in her ankle after boxing, which she attributes to activity rather than the tick bite. No fevers, headaches, or other symptoms suggestive of [facility] spotted fever.  She also received a Reclast infusion this week and took Tylenol  the day before and after the infusion to manage potential side effects, which includes possible headaches.       Medications: Outpatient Medications Prior to Visit  Medication Sig   Bempedoic Acid  180 MG TABS Take 1 tablet (180 mg total) by mouth daily.    Coenzyme Q10 10 MG capsule Take 1 capsule by mouth daily.   estradiol  (ESTRACE  VAGINAL) 0.1 MG/GM vaginal cream Place 1 Applicatorful vaginally at bedtime. May also be applied directly to vulvar and vestibular tissues. Initial management once daily for 2 weeks. Maintenance: one to three times per week   Flaxseed, Linseed, (FLAX SEED OIL) 1000 MG CAPS Take 1 capsule by mouth daily.   levOCARNitine (L-CARNITINE) 500 MG TABS Take 500 mg by mouth daily.   Lysine 600 MG TABS Take 1 tablet by mouth every other day.   Magnesium 250 MG TABS Take 2 tablets by mouth daily.    MAGNESIUM GLYCINATE PO Take by mouth.   Multiple Vitamins-Minerals (CENTRUM SILVER 50+WOMEN) TABS Take by mouth.   PHOSPHATIDYLSERINE PO daily.   Polyethylene Glycol 400 (BLINK TEARS OP) Apply 1 drop to eye in the morning and at bedtime.   senna-docusate (SENOKOT-S) 8.6-50 MG tablet Take 2 tablets by mouth every evening.    thiamine (VITAMIN B-1) 50 MG tablet Take 50 mg by mouth daily.   tretinoin  (RETIN-A ) 0.05 % cream Apply topically at bedtime.   triamcinolone  cream (KENALOG ) 0.1 % Apply 1 Application topically 2 (two) times daily.   Cholecalciferol (VITAMIN D3) 50 MCG (2000 UT) capsule Take 2,000 Units by mouth daily.   denosumab (PROLIA) 60 MG/ML SOSY injection Inject 60 mg into the skin every 6 (six) months.   Multiple Vitamins-Minerals (OCUVITE-LUTEIN PO)  Take 0.5 tablets by mouth daily.   Omega-3 Fatty Acids (FISH OIL) 600 MG CAPS Take 600 mg by mouth daily.   Pediatric Multivit-Minerals-C (ANIMAL CHEWABLE MULTI COMPLETE PO) Take 1 tablet by mouth daily.   No facility-administered medications prior to visit.        Objective    BP 138/70 (BP Location: Right Arm, Patient Position: Sitting, Cuff Size: Normal)   Pulse (!) 58   Ht 5\' 8"  (1.727 m)   Wt 140 lb 1.6 oz (63.5 kg)   SpO2 99%   BMI 21.30 kg/m     Physical Exam Vitals and nursing note reviewed.  Constitutional:      General: She is not in acute  distress.    Appearance: Normal appearance.  HENT:     Head: Normocephalic and atraumatic.  Eyes:     General: No scleral icterus.    Conjunctiva/sclera: Conjunctivae normal.  Cardiovascular:     Rate and Rhythm: Normal rate.  Pulmonary:     Effort: Pulmonary effort is normal.  Skin:         Comments: Small, purplish-red lesion at area of tick bite.  Neurological:     Mental Status: She is alert and oriented to person, place, and time. Mental status is at baseline.  Psychiatric:        Mood and Affect: Mood normal.        Behavior: Behavior normal.      Results for orders placed or performed in visit on 04/05/24  Lyme Disease Serology w/Reflex  Result Value Ref Range   Lyme Total Antibody EIA Negative Negative     Assessment & Plan    Tick bite, unspecified site, initial encounter -     Doxycycline  Hyclate; Take 1 tablet (100 mg total) by mouth 2 (two) times daily.  Dispense: 20 tablet; Refill: 0 -     Lyme Disease Serology w/Reflex  Erythema migrans (Lyme disease) -     Doxycycline  Hyclate; Take 1 tablet (100 mg total) by mouth 2 (two) times daily.  Dispense: 20 tablet; Refill: 0 -     Lyme Disease Serology w/Reflex     Erythema migrans due to tick bite Erythema migrans post-tick bite with potential Lyme disease risk. Antibiotics indicated despite absence of target lesion. Advised on probiotics for antibiotic-associated constipation. - Prescribed doxycycline  100 mg BID for 10 days. - Advised completing full antibiotic course. - Recommended probiotics like Greek yogurt or kefir. - Ordered Lyme disease serology (IgM and IgG) after one week. - Instructed to report fever or joint pain.    Return if symptoms worsen or fail to improve.      I discussed the assessment and treatment plan with the patient  The patient was provided an opportunity to ask questions and all were answered. The patient agreed with the plan and demonstrated an understanding of the  instructions.   The patient was advised to call back or seek an in-person evaluation if the symptoms worsen or if the condition fails to improve as anticipated.    Carlean Charter, DO  [facility] [phone] (phone) [phone] (fax)  [facility]

## 2024-04-10 LAB — LYME DISEASE SEROLOGY W/REFLEX: Lyme Total Antibody EIA: NEGATIVE

## 2024-04-14 NOTE — Progress Notes (Incomplete)
 Established patient visit   Patient: Barbara Gates   DOB: 02/24/1951   73 y.o. Female  MRN: [id number] Visit Date: 04/05/2024  Today's healthcare provider: Carlean Charter, DO   Chief Complaint  Patient presents with  . Tick Bite    Tick bite w/ redness, itching, swelling in groin X Monday evening. Patient reports she has removed the tick. She reports using neosporin and it is the size of her little finger tip. Pt reports it is not as inflammed as it was when calling in but would like to be sure of no lyme disease or anything else and see about any forms of treatment   Subjective    HPI Barbara Gates "[name]" is a 73 year old female who presents with a tick bite and concerns about potential Lyme disease.  She found a tick on her body, which she removed but is unsure if the head was completely extracted. The tick was located on her skin, and she noticed lingering redness, itching, and swelling at the site. She is uncertain about how long the tick was attached but mentioned that it was burrowed enough to cause itching.  She has a family history of tick-borne illnesses, with her cousin having Lyme disease and her cousin's son having alpha-gal syndrome. This has increased her awareness and concern about tick bites.  [place] but has not experienced significant issues with ticks in the past. No joint pain except for some tenderness in her ankle after boxing, which she attributes to activity rather than the tick bite. No fevers, headaches, or other symptoms suggestive of [facility] spotted fever.  She recently received a Reclast infusion and took Tylenol  the day before and after the infusion to manage potential side effects. She also had her vitamin D  levels checked, which were reported as perfect.    Found Monday evening How long is tick estimated to have been attached?   100 mg twice daily for 10 days -  lyme *** It should also be remembered that Lyme disease less often results from a recognized tick bite, since removal of the tick within two to three days of attachment usually prevents transmission of B. burgdorferi. More commonly, Lyme disease is transmitted by an unrecognized tick that feeds to repletion (about four to five days) and then falls off without the person knowing that he or she has been bitten.***  Immune globulin (Ig) M antibodies to B. burgdorferi typically appear within one to two weeks.  ?IgG antibodies to B. burgdorferi typically appear within one to six weeks, depending upon the type of test that is used (eg, C6 versus Western blot). ***  {History (Optional):23778}  Medications: Outpatient Medications Prior to Visit  Medication Sig  . Bempedoic Acid  180 MG TABS Take 1 tablet (180 mg total) by mouth daily.  . Coenzyme Q10 10 MG capsule Take 1 capsule by mouth daily.  . estradiol  (ESTRACE  VAGINAL) 0.1 MG/GM vaginal cream Place 1 Applicatorful vaginally at bedtime. May also be applied directly to vulvar and vestibular tissues. Initial management once daily for 2 weeks. Maintenance: one to three times per week  . Flaxseed, Linseed, (FLAX SEED OIL) 1000 MG CAPS Take 1 capsule by mouth daily.  Aaron Aas levOCARNitine (L-CARNITINE) 500 MG TABS Take 500 mg by mouth daily.  Aaron Aas Lysine 600 MG TABS Take 1 tablet by mouth every other day.  . Magnesium 250 MG TABS Take  2 tablets by mouth daily.   Aaron Aas MAGNESIUM GLYCINATE PO Take by mouth.  . Multiple Vitamins-Minerals (CENTRUM SILVER 50+WOMEN) TABS Take by mouth.  Aaron Aas PHOSPHATIDYLSERINE PO daily.  . Polyethylene Glycol 400 (BLINK TEARS OP) Apply 1 drop to eye in the morning and at bedtime.  . senna-docusate (SENOKOT-S) 8.6-50 MG tablet Take 2 tablets by mouth every evening.   . thiamine (VITAMIN B-1) 50 MG tablet Take 50 mg by mouth daily.  . tretinoin  (RETIN-A ) 0.05 % cream Apply topically at bedtime.  . triamcinolone  cream (KENALOG ) 0.1 %  Apply 1 Application topically 2 (two) times daily.  . Cholecalciferol (VITAMIN D3) 50 MCG (2000 UT) capsule Take 2,000 Units by mouth daily.  Aaron Aas denosumab (PROLIA) 60 MG/ML SOSY injection Inject 60 mg into the skin every 6 (six) months.  . Multiple Vitamins-Minerals (OCUVITE-LUTEIN PO) Take 0.5 tablets by mouth daily.  . Omega-3 Fatty Acids (FISH OIL) 600 MG CAPS Take 600 mg by mouth daily.  . Pediatric Multivit-Minerals-C (ANIMAL CHEWABLE MULTI COMPLETE PO) Take 1 tablet by mouth daily.   No facility-administered medications prior to visit.    Review of Systems ***  {Insert previous labs (optional):23779} {See past labs  Heme  Chem  Endocrine  Serology  Results Review (optional):1}   Objective    BP 138/70 (BP Location: Right Arm, Patient Position: Sitting, Cuff Size: Normal)   Pulse (!) 58   Ht 5\' 8"  (1.727 m)   Wt 140 lb 1.6 oz (63.5 kg)   SpO2 99%   BMI 21.30 kg/m  {Insert last BP/Wt (optional):23777}{See vitals history (optional):1}   Physical Exam Vitals and nursing note reviewed.  Constitutional:      General: She is not in acute distress.    Appearance: Normal appearance.  HENT:     Head: Normocephalic and atraumatic.  Eyes:     General: No scleral icterus.    Conjunctiva/sclera: Conjunctivae normal.  Cardiovascular:     Rate and Rhythm: Normal rate.  Pulmonary:     Effort: Pulmonary effort is normal.  Skin:         Comments: Small, purplish-red lesion at area of tick bite.  Neurological:     Mental Status: She is alert and oriented to person, place, and time. Mental status is at baseline.  Psychiatric:        Mood and Affect: Mood normal.        Behavior: Behavior normal.      Results for orders placed or performed in visit on 04/05/24  Lyme Disease Serology w/Reflex  Result Value Ref Range   Lyme Total Antibody EIA Negative Negative    Assessment & Plan    Tick bite, unspecified site, initial encounter -     Doxycycline  Hyclate; Take 1 tablet  (100 mg total) by mouth 2 (two) times daily.  Dispense: 20 tablet; Refill: 0 -     Lyme Disease Serology w/Reflex  Erythema migrans (Lyme disease) -     Doxycycline  Hyclate; Take 1 tablet (100 mg total) by mouth 2 (two) times daily.  Dispense: 20 tablet; Refill: 0 -     Lyme Disease Serology w/Reflex     Erythema migrans due to tick bite Erythema migrans post-tick bite with potential Lyme disease risk. Antibiotics indicated despite absence of target lesion. Advised on probiotics for antibiotic-associated constipation. - Prescribed doxycycline  100 mg BID for 10 days. - Advised completing full antibiotic course. - Recommended probiotics like Greek yogurt or kefir. - Ordered Lyme disease serology (IgM and  IgG) after one week. - Instructed to report fever or joint pain.    Return if symptoms worsen or fail to improve.      I discussed the assessment and treatment plan with the patient  The patient was provided an opportunity to ask questions and all were answered. The patient agreed with the plan and demonstrated an understanding of the instructions.   The patient was advised to call back or seek an in-person evaluation if the symptoms worsen or if the condition fails to improve as anticipated.    Carlean Charter, DO  [facility] [phone] (phone) [phone] (fax)  [facility]

## 2024-04-15 ENCOUNTER — Encounter: Payer: Self-pay | Admitting: Family Medicine

## 2024-04-22 ENCOUNTER — Telehealth: Payer: Self-pay

## 2024-04-22 DIAGNOSIS — Z1211 Encounter for screening for malignant neoplasm of colon: Secondary | ICD-10-CM

## 2024-04-22 NOTE — Telephone Encounter (Signed)
 Barbara Gates,   I am sending this to you because I am not sure if I when I will be back

## 2024-04-22 NOTE — Telephone Encounter (Signed)
 Pt requesting call back to schedule colonoscopy.

## 2024-04-22 NOTE — Telephone Encounter (Signed)
 Spoken to patient and she is currently in an exercise class also will be on airplane tomorrow. Plan on calling patient back on Monday, 04/29/2024 when she is back in. Need to call patient before 10 am.

## 2024-04-29 NOTE — Addendum Note (Signed)
 Addended by: Judyann Number on: 04/29/2024 12:51 PM   Modules accepted: Orders

## 2024-05-09 LAB — COLOGUARD: COLOGUARD: NEGATIVE

## 2024-05-13 ENCOUNTER — Other Ambulatory Visit: Payer: Self-pay | Admitting: Family Medicine

## 2024-05-13 DIAGNOSIS — Z1231 Encounter for screening mammogram for malignant neoplasm of breast: Secondary | ICD-10-CM

## 2024-05-17 ENCOUNTER — Ambulatory Visit: Payer: Self-pay | Admitting: Family Medicine

## 2024-06-04 ENCOUNTER — Telehealth: Payer: Self-pay

## 2024-06-04 DIAGNOSIS — R001 Bradycardia, unspecified: Secondary | ICD-10-CM

## 2024-06-04 DIAGNOSIS — R053 Chronic cough: Secondary | ICD-10-CM

## 2024-06-04 DIAGNOSIS — N182 Chronic kidney disease, stage 2 (mild): Secondary | ICD-10-CM

## 2024-06-04 DIAGNOSIS — E785 Hyperlipidemia, unspecified: Secondary | ICD-10-CM

## 2024-06-04 DIAGNOSIS — R7989 Other specified abnormal findings of blood chemistry: Secondary | ICD-10-CM

## 2024-06-04 DIAGNOSIS — M810 Age-related osteoporosis without current pathological fracture: Secondary | ICD-10-CM

## 2024-06-04 DIAGNOSIS — R7309 Other abnormal glucose: Secondary | ICD-10-CM

## 2024-06-04 DIAGNOSIS — Z789 Other specified health status: Secondary | ICD-10-CM

## 2024-06-04 DIAGNOSIS — E7841 Elevated Lipoprotein(a): Secondary | ICD-10-CM

## 2024-06-04 DIAGNOSIS — G8929 Other chronic pain: Secondary | ICD-10-CM

## 2024-06-04 DIAGNOSIS — G20C Parkinsonism, unspecified: Secondary | ICD-10-CM

## 2024-06-04 MED ORDER — BEMPEDOIC ACID 180 MG PO TABS: 1.0000 | ORAL_TABLET | Freq: Every day | ORAL | 3 refills | Status: AC

## 2024-06-04 NOTE — Telephone Encounter (Signed)
 Copied from CRM [id number]. Topic: Referral - Question >> Jun 04, 2024 10:28 AM Charlet HERO wrote: Reason for RMF:[id number] is calling to have  a1c lipid LPa and kidneys lab work  she wants to know if there are any other test the doctor would like her to have, also the patient would like to get 90 day supply of meds ,also that the bottles not be child proof because of her hands. Patient is also requesting to have ortho referral for hands and shoulder she would like for Dr Donzella to suggest the ortho for her as well she needs a pulmonary test  per my chart and she is not sure if she needs to have the test  and if she does would like for it to be scheduled. She spoke to a speech therapist at a Parkinson convention she went to and she suggested that she should try a speech therapist  for her cough she would like to know if Dr. Donzella would suggest this and if so would like a referral.  She says my chart is the best for communication and if need to have call they can setup time via my chart communication.

## 2024-06-04 NOTE — Addendum Note (Signed)
 Addended byBETHA DONZELLA DOMINO on: 06/04/2024 02:06 PM   Modules accepted: Orders

## 2024-06-04 NOTE — Telephone Encounter (Signed)
 Does pt need an appt to have yearly blood work done, along with referrals request. I will send pt a message regarding what medication she is wanting refill on.

## 2024-06-07 LAB — COMPREHENSIVE METABOLIC PANEL WITH GFR
ALT: 14 IU/L (ref 0–32)
AST: 25 IU/L (ref 0–40)
Albumin: 4.6 g/dL (ref 3.8–4.8)
Alkaline Phosphatase: 45 IU/L (ref 44–121)
BUN/Creatinine Ratio: 16 (ref 12–28)
BUN: 14 mg/dL (ref 8–27)
Bilirubin Total: 0.4 mg/dL (ref 0.0–1.2)
CO2: 20 mmol/L (ref 20–29)
Calcium: 9.8 mg/dL (ref 8.7–10.3)
Chloride: 100 mmol/L (ref 96–106)
Creatinine, Ser: 0.89 mg/dL (ref 0.57–1.00)
Globulin, Total: 2.7 g/dL (ref 1.5–4.5)
Glucose: 95 mg/dL (ref 70–99)
Potassium: 4.4 mmol/L (ref 3.5–5.2)
Sodium: 138 mmol/L (ref 134–144)
Total Protein: 7.3 g/dL (ref 6.0–8.5)
eGFR: 69 mL/min/1.73 (ref >59)

## 2024-06-07 LAB — LIPID PANEL
Chol/HDL Ratio: 2.2 ratio (ref 0.0–4.4)
Cholesterol, Total: 185 mg/dL (ref 100–199)
HDL: 86 mg/dL (ref >39)
LDL Chol Calc (NIH): 88 mg/dL (ref 0–99)
Triglycerides: 58 mg/dL (ref 0–149)
VLDL Cholesterol Cal: 11 mg/dL (ref 5–40)

## 2024-06-07 LAB — HEMOGLOBIN A1C
Est. average glucose Bld gHb Est-mCnc: 111 mg/dL
Hgb A1c MFr Bld: 5.5 % (ref 4.8–5.6)

## 2024-06-07 LAB — LIPOPROTEIN A (LPA): Lipoprotein (a): 105.2 nmol/L — ABNORMAL HIGH (ref <75.0)

## 2024-06-07 LAB — TSH RFX ON ABNORMAL TO FREE T4: TSH: 3.78 u[IU]/mL (ref 0.450–4.500)

## 2024-06-07 LAB — MICROALBUMIN / CREATININE URINE RATIO
Creatinine, Urine: 31.9 mg/dL
Microalb/Creat Ratio: <9 mg/g{creat} (ref 0–29)
Microalbumin, Urine: <3 ug/mL

## 2024-06-07 LAB — VITAMIN B12: Vitamin B-12: 602 pg/mL (ref 232–1245)

## 2024-06-07 LAB — VITAMIN D 25 HYDROXY (VIT D DEFICIENCY, FRACTURES): Vit D, 25-Hydroxy: 74.5 ng/mL (ref 30.0–100.0)

## 2024-06-13 ENCOUNTER — Ambulatory Visit: Attending: Family Medicine | Admitting: Speech Pathology

## 2024-06-13 ENCOUNTER — Ambulatory Visit: Payer: Self-pay | Admitting: Family Medicine

## 2024-06-13 DIAGNOSIS — R053 Chronic cough: Secondary | ICD-10-CM | POA: Diagnosis present

## 2024-06-13 DIAGNOSIS — G20C Parkinsonism, unspecified: Secondary | ICD-10-CM | POA: Insufficient documentation

## 2024-06-13 DIAGNOSIS — E7841 Elevated Lipoprotein(a): Secondary | ICD-10-CM

## 2024-06-13 NOTE — Therapy (Signed)
 OUTPATIENT SPEECH LANGUAGE PATHOLOGY  EVALUATION   Patient Name: Barbara Gates MRN: [id number] DOB:1951-05-14, 73 y.o., female Today's Date: 06/13/2024  PCP: Lauraine Buoy, DO REFERRING PROVIDER: Lauraine Buoy, DO   End of Session - 06/13/24 1314     Visit Number 1    Number of Visits --    Date for SLP Re-Evaluation --    Authorization Type [employer]    Progress Note Due on Visit --    SLP Start Time 1315    SLP Stop Time  1350    SLP Time Calculation (min) 35 min    Activity Tolerance Patient tolerated treatment well          Past Medical History:  Diagnosis Date   Allergy    specified in earlier questions   Cataract    removed late summer 2021   Dry eye    Dysplastic nevus 08/04/2020   R lat buttock - moderate   Dysplastic nevus 08/23/2022   left central upper abdomen, moderate atypia   Dysplastic nevus 08/23/2022   right central upper abdomen, shave removal 09/28/2022   Dyspnea    with exertion   Endometriosis    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 Mancil   Hypercalcemia    Hyperlipidemia    Has history of this   Motion sickness    Nodule of vagina    Osteoporosis    Parkinson's disease (HCC) 03/2021   Redundant colon    SOB (shortness of breath)    Past Surgical History:  Procedure Laterality Date   ABDOMINAL HYSTERECTOMY  1994   Total including ovarie. Due to endometriosis   APPENDECTOMY     with hysterectomy    BREAST BIOPSY Left 11/08/2012   Dr. Dessa, neg   CATARACT EXTRACTION W/PHACO Right 05/27/2020   Procedure: CATARACT EXTRACTION PHACO AND INTRAOCULAR LENS PLACEMENT (IOC) RIGHT PANOPTIC LENS;  Surgeon: Mittie Gaskin, MD;  Location: [facility];  Service: Ophthalmology;  Laterality: Right;  2.14 0:43.8 4.8%   CATARACT EXTRACTION W/PHACO Left 07/22/2020   Procedure: CATARACT EXTRACTION PHACO AND INTRAOCULAR LENS PLACEMENT (IOC) LEFT PANOPTIX LENS 5.31 00:49.1 10.8%;  Surgeon: Mittie Gaskin, MD;  Location: [facility];  Service: Ophthalmology;  Laterality: Left;   EYE SURGERY  2021   cataracts   granular cell tumor removed     OTHER SURGICAL HISTORY     Surgical Lipoma Removal; 12/13/2012  12/13/2012   VULVAR LESION REMOVAL Left 08/22/2018   Procedure: Excision left vulvar mass, possible sentinel node mapping with blue dye, possible left inguinal lymph node biopsy or dissection;  Surgeon: Mancil Barter, MD;  Location: [facility];  Service: Gynecology;  Laterality: Left;   Patient Active Problem List   Diagnosis Date Noted   Statin intolerance 03/22/2024   Vaginal dryness, menopausal 10/25/2023   Cold sensitivity 10/22/2023   High magnesium levels 10/22/2023   Onychorrhexis 10/22/2023   Abdominal obesity 10/22/2023   Optic nerve hemorrhage, right 10/22/2023   Nystagmus 06/21/2023   Balance problem 06/21/2023   CKD (chronic kidney disease) stage 2, GFR 60-89 ml/min 06/16/2023   Insomnia 08/25/2022   Dry eyes 11/04/2021   Primary parkinsonism (HCC) 02/20/2021   CAD in native artery 01/15/2021   Chest tightness 09/03/2020   Vulvar lesion 07/31/2018   Nodule of tendon sheath 07/19/2018   Chronic pain of both shoulders 04/05/2018   Cough 06/01/2016   Dyspnea on exertion 06/01/2016   Memory impairment of gradual onset 12/30/2015  Adult idiopathic generalized osteoporosis 06/11/2015   Sinus bradycardia on ECG 06/11/2015   Colon polyp 06/11/2015   Closed fracture of proximal end of radius 06/11/2015   Auditory impairment 06/11/2015   Hyperlipidemia 06/11/2015   Gonalgia 06/11/2015   Herpes zona 06/11/2015   Female genuine stress incontinence 06/11/2015   Osteoporosis 06/11/2015   Chronic constipation 09/02/2014   Lipoma of back-left 07/18/2012    ONSET DATE:  2017; date of referral 06/04/2024  REFERRING DIAG: R05.3 (ICD-10-CM) - Chronic cough G20.C (ICD-10-CM) - Primary parkinsonism (HCC)   THERAPY DIAG:  Chronic cough  Rationale for  Evaluation and Treatment Rehabilitation  SUBJECTIVE:   SUBJECTIVE STATEMENT: Pt pleasant, appeared to be a good historian, brought in notes to help describe features of cough Pt accompanied by: self  PERTINENT HISTORY: Pt's history is complex with report of chronic cough since 2017. Pt chart, pt has had pulmonary and ENT evaluation and follow up with no known etiology of cough stated. Pulmonology recommended PPI for reflux precautions.     PAIN:  Are you having pain? No   FALLS: Has patient fallen in last 6 months? No,   LIVING ENVIRONMENT: Lives with: lives with their spouse Lives in: House/apartment  PLOF: Independent  PATIENT GOALS    to find out what is causing her cough  OBJECTIVE:  COGNITION: Overall cognitive status: Within functional limits for tasks assessed   ORAL MOTOR EXAMINATION Facial : WFL Lingual: WFL Velum: WFL Mandible: WFL Cough: WFL Voice: WFL   PATIENT REPORTED OUTCOME MEASURES (PROM):  The [place] Cough Questionnaire (LCQ) is a 19 item self-completed questionnaire , signed to assess the impact of chronic cough on a patient's quality of life. If focuses on the , patient's experience of cough over the previous two weeks, including aspects like physical (chest or stomach pain, sputum production, tiredness, sleep disturbances), psychological distress (anxiety, embarrassment, feeling out of control, worries about serious illness), and social impact (interference with daily activities, social life, relationships). Each item is rated on a 7-point Likert scale, ranging from all of the time to none of the time. The total score ranges from 3 to 21, with higher scores indicating a better quality of life related to cough. Pt with score of 20.    TODAY'S TREATMENT:  Skilled education provided on protective mechanism of cough as well as identifying triggers of cough and developing avoidance measures   PATIENT EDUCATION: Education details: see impression  statement Person educated: Patient Education method: Explanation Education comprehension: verbalized understanding   ASSESSMENT:  CLINICAL IMPRESSION: Patient is a 73 y.o. female who was seen today for evaluation of chronic cough.   Pt states that she will cough during the following: If I inhale deeply and then exhale deeply I wheeze and cough If I walk up and down the stairs while I am talking on the phone the person will say are you out of breath? When I lie down and sleep on my left side If I eat something spicy it feels like it is going down the wrong pipe, I cough and sneeze twice then I am done and can go on eating  Outside of these scenarios, pt's cough is not frequent.  Pt is interested in discovering the etiology behind having a cough response to the above situations. She reports being evaluated by pulmonology, ENT and her PCP with no ability to diagnose the reason why I cough. Pt mentions that pulmonology has suggested a PPI but pt declined taking it as the etiology of  her cough response had not been established as relating to reflux. Establishing the etiology of pt's cough response is out of this writer's scope of practice. However part of  cough management consists of establishing triggers (the above mentioned situations) and avoiding them. As such, recommend that pt avoid spicy foods, potentially place a brick under the head of her bed to help with management of any potential reflux. Over the course of the 45 minute evaluation, pt had a single cough x 2. Pt's cough was not frequent or persistent and doesn't have a negative impact on her quality of life at this time. For these reasons, continued skilled ST services are not indicated at this time. Potential considerations would be referral to GI to evaluate potential reflux d/t coughing when laying down and cough response to spicy foods.   PLAN: Skilled ST services are not indicated at this time.    Macsen Nuttall B. Rubbie, M.S.,  CCC-SLP, Tree surgeon Certified Brain Injury Specialist [facility]  [facility] Office [phone] Ascom [phone] Fax [phone]

## 2024-06-18 ENCOUNTER — Ambulatory Visit: Admitting: Speech Pathology

## 2024-06-25 ENCOUNTER — Encounter: Admitting: Speech Pathology

## 2024-06-27 ENCOUNTER — Ambulatory Visit
Admission: RE | Admit: 2024-06-27 | Discharge: 2024-06-27 | Disposition: A | Discharge status: Home / Self Care | Source: Ambulatory Visit | Attending: Family Medicine | Admitting: Family Medicine

## 2024-06-27 ENCOUNTER — Encounter: Admitting: Speech Pathology

## 2024-06-27 DIAGNOSIS — Z1231 Encounter for screening mammogram for malignant neoplasm of breast: Secondary | ICD-10-CM | POA: Insufficient documentation

## 2024-07-01 ENCOUNTER — Ambulatory Visit: Payer: Self-pay | Admitting: Family Medicine

## 2024-07-02 ENCOUNTER — Encounter: Admitting: Speech Pathology

## 2024-07-04 ENCOUNTER — Encounter: Admitting: Speech Pathology

## 2024-07-08 ENCOUNTER — Encounter: Admitting: Speech Pathology

## 2024-07-09 ENCOUNTER — Encounter: Admitting: Speech Pathology

## 2024-07-11 ENCOUNTER — Encounter: Admitting: Speech Pathology

## 2024-07-15 ENCOUNTER — Encounter: Admitting: Speech Pathology

## 2024-07-16 ENCOUNTER — Encounter: Admitting: Speech Pathology

## 2024-07-16 DIAGNOSIS — M1811 Unilateral primary osteoarthritis of first carpometacarpal joint, right hand: Secondary | ICD-10-CM | POA: Insufficient documentation

## 2024-07-18 ENCOUNTER — Encounter: Admitting: Speech Pathology

## 2024-07-23 ENCOUNTER — Encounter: Admitting: Speech Pathology

## 2024-07-25 ENCOUNTER — Encounter: Admitting: Speech Pathology

## 2024-07-29 ENCOUNTER — Telehealth: Payer: Self-pay

## 2024-07-29 NOTE — Telephone Encounter (Signed)
 Copied from CRM [id number]. Topic: Clinical - Medication Question >> Jul 29, 2024 10:03 AM [place] A wrote: Reason for CRM: Patient would like a prescription for Covid Vaccine and also would like to know if it is too soon to have Covid vaccine since she had a Flu Shot on 07/23/24? Please contact

## 2024-07-30 ENCOUNTER — Encounter: Admitting: Speech Pathology

## 2024-08-01 ENCOUNTER — Encounter: Admitting: Speech Pathology

## 2024-08-02 ENCOUNTER — Encounter (HOSPITAL_BASED_OUTPATIENT_CLINIC_OR_DEPARTMENT_OTHER): Payer: Self-pay | Admitting: Internal Medicine

## 2024-08-02 ENCOUNTER — Telehealth: Payer: Self-pay | Admitting: Family Medicine

## 2024-08-02 ENCOUNTER — Ambulatory Visit (HOSPITAL_BASED_OUTPATIENT_CLINIC_OR_DEPARTMENT_OTHER): Admitting: Internal Medicine

## 2024-08-02 VITALS — BP 128/86 | HR 60 | Ht 68.0 in | Wt 138.6 lb

## 2024-08-02 DIAGNOSIS — E785 Hyperlipidemia, unspecified: Secondary | ICD-10-CM | POA: Diagnosis not present

## 2024-08-02 DIAGNOSIS — T466X5D Adverse effect of antihyperlipidemic and antiarteriosclerotic drugs, subsequent encounter: Secondary | ICD-10-CM

## 2024-08-02 DIAGNOSIS — E7841 Elevated Lipoprotein(a): Secondary | ICD-10-CM | POA: Diagnosis not present

## 2024-08-02 DIAGNOSIS — M791 Myalgia, unspecified site: Secondary | ICD-10-CM | POA: Diagnosis not present

## 2024-08-02 DIAGNOSIS — I2510 Atherosclerotic heart disease of native coronary artery without angina pectoris: Secondary | ICD-10-CM | POA: Diagnosis not present

## 2024-08-02 DIAGNOSIS — Z789 Other specified health status: Secondary | ICD-10-CM

## 2024-08-02 DIAGNOSIS — T466X5A Adverse effect of antihyperlipidemic and antiarteriosclerotic drugs, initial encounter: Secondary | ICD-10-CM

## 2024-08-02 MED ORDER — COVID-19 MRNA VACC (MODERNA) 50 MCG/0.5ML IM SUSY: 0.5000 mL | PREFILLED_SYRINGE | Freq: Once | INTRAMUSCULAR | 0 refills | Status: AC

## 2024-08-02 NOTE — Telephone Encounter (Signed)
 Copied from CRM [id number]. Topic: Clinical - Medication Question >> Aug 02, 2024  9:08 AM Tiffini S wrote: Reason for CRM: Patient called back to request a prescription be sent for the COVID vaccine to:  [id number] - [place], [place]  [address] [facility] AT [facility] [address] [place] [address] Phone: [phone] Fax: [phone]  Asked for a call back from the clinic at [phone] to discuss when the request will be sent to the pharmacy.

## 2024-08-02 NOTE — Patient Instructions (Signed)
 Medication Instructions:   No changes    Lab Work: Not needed If you have labs (blood work) drawn today and your tests are completely normal, you will receive your results only by:    Testing/Procedures:  Not needed  Follow-Up: At [facility], you and your health needs are our priority.  As part of our continuing mission to provide you with exceptional heart care, we have created designated Provider Care Teams.  These Care Teams include your primary Cardiologist (physician) and Advanced Practice Providers (APPs -  Physician Assistants and Nurse Practitioners) who all work together to provide you with the care you need, when you need it.     Your next appointment:   As   needed  The format for your next appointment:   In Person  Provider:   K. Italy Hilty, MD

## 2024-08-02 NOTE — Progress Notes (Signed)
 LIPID CLINIC CONSULT NOTE  Chief Complaint:  Elevated LP(a)  Primary Care Physician: Barbara Lauraine SAILOR, DO  Primary Cardiologist:  Barbara Hanson, MD  HPI:  Barbara Gates is a 73 y.o. female who is being seen today for the evaluation of elevated LP(a) at the request of Pardue, Sarah N, DO. This is a pleasant 73 year old female kindly referred for evaluation management of elevated LP(a).  She had previously seen Dr. Hanson in [place].  She had a prior coronary CT angiogram in 2021 which is 76.2, 73rd percentile for age and sex matched controls.  Fortunately there was only mild stenosis in the LAD.  She has also had elevated LP(a).  This has varied between the low 90s to more recently at 105.  We do know that there could be some general variation in this and increase naturally over time.  She has had intolerance of statins and previously was on Repatha  but had side effects with that which caused unusual smells.  Currently she has been tolerating Nexletol .  Recent lipids showed total cholesterol 185, triglycerides 86, HDL 58 and LDL 88.  Her goal LDL probably should be less than 70 since she has aged Barbara Gates coronary calcification.  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 Mancil   Hypercalcemia    Hyperlipidemia    Has history of this   Motion sickness    Nodule of vagina    Osteoporosis    Parkinson's disease (HCC) 03/2021   Redundant colon    SOB (shortness of breath)     Past Surgical History:  Procedure Laterality Date   ABDOMINAL HYSTERECTOMY  1994   Total  including ovarie. Due to endometriosis   APPENDECTOMY     with hysterectomy    BREAST BIOPSY Left 11/08/2012   Dr. Dessa, neg   CATARACT EXTRACTION W/PHACO Right 05/27/2020   Procedure: CATARACT EXTRACTION PHACO AND INTRAOCULAR LENS PLACEMENT (IOC) RIGHT PANOPTIC LENS;  Surgeon: Barbara Gaskin, MD;  Location: [facility];  Service: Ophthalmology;  Laterality: Right;  2.14 0:43.8 4.8%   CATARACT EXTRACTION W/PHACO Left 07/22/2020   Procedure: CATARACT EXTRACTION PHACO AND INTRAOCULAR LENS PLACEMENT (IOC) LEFT PANOPTIX LENS 5.31 00:49.1 10.8%;  Surgeon: Barbara Gaskin, MD;  Location: [facility];  Service: Ophthalmology;  Laterality: Left;   EYE SURGERY  2021   cataracts   granular cell tumor removed     OTHER SURGICAL HISTORY     Surgical Lipoma Removal; 12/13/2012  12/13/2012   VULVAR LESION REMOVAL Left 08/22/2018   Procedure: Excision left vulvar mass, possible sentinel node mapping with blue dye, possible left inguinal lymph node biopsy or dissection;  Surgeon: Barbara Gates Barter, MD;  Location: [facility];  Service: Gynecology;  Laterality: Left;    FAMHx:  Family History  Problem Relation Age of Onset   Cancer Mother        lung, pancreas, melanoma, carcinoid tumors   Hypertension Mother    Hyperlipidemia Mother    Diabetes Mother  Hearing loss Mother    Vision loss Mother    Hypertension Father    Diabetes Father    Hyperlipidemia Father    Parkinson's disease Father    CAD Father    Vascular Disease Father        hardening of the arteries   Hearing loss Father    Heart disease Father    Diabetes Brother    Hypertension Brother    ADD / ADHD Brother    Alcohol abuse Brother    Cancer Brother    Breast cancer Sister 71   Arthritis Sister    Hypertension Sister    Dupuytren's contracture Sister    Cancer Sister    Early death Maternal Grandfather    Heart disease Maternal Grandmother    Heart disease Paternal Grandfather    Early  death Paternal Grandmother    Heart disease Paternal Grandmother    Diabetes Brother    Hearing loss Brother    Hyperlipidemia Brother    Obesity Brother     SOCHx:   reports that she has never smoked. She has never used smokeless tobacco. She reports current alcohol use of about 1.0 standard drink of alcohol per week. She reports that she does not use drugs.  ALLERGIES:  Allergies  Allergen Reactions   Methocarbamol     Cramping and Aching   Other Hives    selamectin active ingredient in Revolution Plus for cats.   Poison Ivy Extract Itching   Raloxifene  Hcl Cough        Repatha  [Evolocumab ]     Patient states she could smell things that werent pleasant causing her to gag and cough   Systane [Polyethyl Glycol-Propyl Glycol] Itching   Tamoxifen Cough   Wasp Venom Protein Hives   Xiidra [Lifitegrast] Itching   Mango Flavoring Agent (Non-Screening) Itching and Rash    ROS: Pertinent items noted in HPI and remainder of comprehensive ROS otherwise negative.  HOME MEDS: Current Outpatient Medications on File Prior to Visit  Medication Sig Dispense Refill   Bempedoic Acid  180 MG TABS Take 1 tablet (180 mg total) by mouth daily. 90 tablet 3   Coenzyme Q10 10 MG capsule Take 1 capsule by mouth daily.     estradiol  (ESTRACE  VAGINAL) 0.1 MG/GM vaginal cream Place 1 Applicatorful vaginally at bedtime. May also be applied directly to vulvar and vestibular tissues. Initial management once daily for 2 weeks. Maintenance: one to three times per week 42.5 g 12   Flaxseed, Linseed, (FLAX SEED OIL) 1000 MG CAPS Take 1 capsule by mouth daily.     levOCARNitine (L-CARNITINE) 500 MG TABS Take 500 mg by mouth daily.     Lysine 600 MG TABS Take 1 tablet by mouth every other day.     Magnesium 250 MG TABS Take 2 tablets by mouth daily.      MAGNESIUM GLYCINATE PO Take by mouth.     Multiple Vitamins-Minerals (CENTRUM SILVER 50+WOMEN) TABS Take by mouth.     Perfluorohexyloctane (MIEBO OP) Apply  3 drops to eye daily.     PHOSPHATIDYLSERINE PO daily.     senna-docusate (SENOKOT-S) 8.6-50 MG tablet Take 2 tablets by mouth every evening.      thiamine (VITAMIN B-1) 50 MG tablet Take 50 mg by mouth daily.     tretinoin  (RETIN-A ) 0.05 % cream Apply topically at bedtime.     triamcinolone  cream (KENALOG ) 0.1 % Apply 1 Application topically 2 (two) times daily.     No  current facility-administered medications on file prior to visit.    LABS/IMAGING: No results found for this or any previous visit (from the past 48 hours). No results found.  LIPID PANEL:    Component Value Date/Time   CHOL 185 06/06/2024 0908   TRIG 58 06/06/2024 0908   HDL 86 06/06/2024 0908   CHOLHDL 2.2 06/06/2024 0908   CHOLHDL 2.6 08/17/2022 0931   VLDL 11 08/17/2022 0931   LDLCALC 88 06/06/2024 0908   LDLCALC 87 10/19/2017 1012    Lipoprotein (a)  Date/Time Value Ref Range Status  06/06/2024 09:08 AM 105.2 (H) <75.0 nmol/L Final    Comment:    Note:  Values greater than or equal to 75.0 nmol/L may        indicate an independent risk factor for CHD,        but must be evaluated with caution when applied        to non-Caucasian populations due to the        influence of genetic factors on Lp(a) across        ethnicities.      WEIGHTS: Wt Readings from Last 3 Encounters:  08/02/24 138 lb 9.6 oz (62.9 kg)  04/05/24 140 lb 1.6 oz (63.5 kg)  02/23/24 140 lb 4.8 oz (63.6 kg)    VITALS: BP 128/86   Pulse 60   Ht 5' 8 (1.727 m)   Wt 138 lb 9.6 oz (62.9 kg)   SpO2 100%   BMI 21.07 kg/m   EXAM: Deferred  EKG: Deferred  ASSESSMENT: Dyslipidemia, goal LDL less than 70 Coronary calcification with mild nonobstructive coronary disease, CAC score 76.2, 73rd percentile (2021) Elevated LP(a)-105.2 nmol/L Statin and Repatha  intolerance  PLAN: 1.   Ms. Saccente has a mildly elevated LP(a) and age advanced coronary artery calcification.  Her goal LDL should be less than 70.  Fortunately she has  had intolerances to Repatha  and statins in the past.  She is currently on Nexletol  which she seems to tolerate.  LDL is 88.  1 option would be to try to reach her target LDL less than 70 but switching her to Nexlizet (including Zetia).  Although this will not address her LP(a) it will help her to get to target.  Alternatively, we could consider Leqvio as an option which would potentially lower her LP(a) however she had concerns about the long-acting duration of the medication and potential side effects.  Ultimately, she wants to consider options but did not want to change her therapy at this time.  I am happy to see her back on an as-needed basis.  Thanks again for the kind referral.  Vinie KYM Maxcy, MD, [facility], FNLA, FACP  [place]  [facility]  Medical Director of the Advanced Lipid Disorders &  Cardiovascular Risk Reduction Clinic Diplomate of the American Board of Clinical Lipidology Attending Cardiologist  Direct Dial: [phone]  Fax: [phone]  Website:  [email or web address] Vinie BROCKS Abeer Deskins 08/02/2024, 1:35 PM

## 2024-08-06 ENCOUNTER — Encounter: Admitting: Speech Pathology

## 2024-08-27 ENCOUNTER — Institutional Professional Consult (permissible substitution) (HOSPITAL_BASED_OUTPATIENT_CLINIC_OR_DEPARTMENT_OTHER): Admitting: Internal Medicine

## 2024-08-31 ENCOUNTER — Other Ambulatory Visit: Payer: Self-pay | Admitting: Family Medicine

## 2024-08-31 DIAGNOSIS — A6920 Lyme disease, unspecified: Secondary | ICD-10-CM

## 2024-08-31 DIAGNOSIS — W57XXXA Bitten or stung by nonvenomous insect and other nonvenomous arthropods, initial encounter: Secondary | ICD-10-CM

## 2024-09-03 ENCOUNTER — Encounter: Payer: Self-pay | Admitting: Family Medicine

## 2024-09-03 ENCOUNTER — Ambulatory Visit: Admitting: Family Medicine

## 2024-09-03 VITALS — BP 144/72 | HR 55 | Temp 98.1°F | Ht 67.91 in | Wt 139.4 lb

## 2024-09-03 DIAGNOSIS — Z0000 Encounter for general adult medical examination without abnormal findings: Secondary | ICD-10-CM

## 2024-09-03 NOTE — Progress Notes (Signed)
 Subjective:   Barbara Gates is a 73 y.o. female who presents for Medicare Annual (Subsequent) preventive examination.  Visit Complete: In person  Patient Medicare AWV questionnaire was completed by the patient on 09/02/2024; I have confirmed that all information answered by patient is correct and no changes since this date.     She consumes alcohol, specifically one glass of wine every Friday. She feels that screening questions sometimes inaccurately categorize her as a heavier drinker than she is.  She has a past adverse reaction to methocarbamol, which caused her entire body to cramp up after taking half a pill. It is noted under allergies.  She is currently taking bempedoic acid  for lipid management and has tried various other lipid-lowering medications in the past, including Repatha , which she found intolerable. She has not tried ezetimibe and is hesitant to switch to stronger medications or injectables due to past experiences and potential side effects.  She has a history of small lung nodules that were found incidentally; she recalls being told that follow-up was not needed. She has long-standing breathing and coughing issues, experiencing shortness of breath when climbing stairs and coughing when lying down. She walks one to two miles daily and participates in exercise programs. She has eliminated spicy foods from her diet as they trigger coughing and sneezing. She has tried PPIs in the past without significant improvement.  She had cataract surgery several years ago and now experiences dry eyes. She has difficulty reading small print and focusing on curved surfaces, affecting her daily activities.  She has a history of shoulder and hand issues, including a tendon problem in her hand and a clicking thumb. She received a steroid injection in her thumb, which may have provided some relief, but she is not interested in surgical intervention unless necessary.  She monitors her blood  pressure at home, which is typically around 125 mmHg when relaxed.       Objective:    Today's Vitals   09/03/24 1444 09/03/24 1508  BP: (!) 150/63 (!) 144/72  Pulse: (!) 58 (!) 55  Temp: 98.1 F (36.7 C)   TempSrc: Oral   SpO2: 100%   Weight: 139 lb 6.4 oz (63.2 kg)   Height: 5' 7.91 (1.725 m)    Body mass index is 21.25 kg/m.     07/19/2022    6:03 PM 11/04/2020   10:45 AM 07/22/2020    9:11 AM 05/27/2020    7:49 AM 10/29/2019   11:02 AM 10/24/2018   11:16 AM 09/19/2018    2:03 PM  Advanced Directives  Does Patient Have a Medical Advance Directive? No Yes Yes No Yes Yes  Yes   Type of Furniture Conservator/restorer;Living will Healthcare Power of [place];Living will  Healthcare Power of [place];Living will Healthcare Power of [place];Living will Healthcare Power of [place];Living will  Does patient want to make changes to medical advance directive?   No - Patient declined    No - Patient declined   Copy of Healthcare Power of Attorney in Chart?  Yes - validated most recent copy scanned in chart (See row information) No - copy requested  Yes - validated most recent copy scanned in chart (See row information) Yes - validated most recent copy scanned in chart (See row information)  Yes   Would patient like information on creating a medical advance directive?    No - Patient declined        Data saved with a previous  flowsheet row definition    Current Medications (verified) Outpatient Encounter Medications as of 09/03/2024  Medication Sig   Bempedoic Acid  180 MG TABS Take 1 tablet (180 mg total) by mouth daily.   Coenzyme Q10 10 MG capsule Take 1 capsule by mouth daily.   estradiol  (ESTRACE  VAGINAL) 0.1 MG/GM vaginal cream Place 1 Applicatorful vaginally at bedtime. May also be applied directly to vulvar and vestibular tissues. Initial management once daily for 2 weeks. Maintenance: one to three times per week (Patient taking differently: Place 1  Applicatorful vaginally as needed. May also be applied directly to vulvar and vestibular tissues. Initial management once daily for 2 weeks. Maintenance: one to three times per week)   Flaxseed, Linseed, (FLAX SEED OIL) 1000 MG CAPS Take 1 capsule by mouth daily.   levOCARNitine (L-CARNITINE) 500 MG TABS Take 500 mg by mouth daily.   Lysine 600 MG TABS Take 1 tablet by mouth every other day.   Magnesium 250 MG TABS Take 2 tablets by mouth daily.    MAGNESIUM GLYCINATE PO Take by mouth.   Multiple Vitamins-Minerals (CENTRUM SILVER 50+WOMEN) TABS Take by mouth.   Perfluorohexyloctane (MIEBO OP) Apply 3 drops to eye daily.   PHOSPHATIDYLSERINE PO daily. (Patient taking differently: Take 2 capsules by mouth daily.)   senna-docusate (SENOKOT-S) 8.6-50 MG tablet Take 2 tablets by mouth every evening.  (Patient taking differently: Take 1 tablet by mouth every evening.)   thiamine (VITAMIN B-1) 50 MG tablet Take 50 mg by mouth daily.   tretinoin  (RETIN-A ) 0.05 % cream Apply topically at bedtime. (Patient taking differently: Apply topically as needed.)   triamcinolone  cream (KENALOG ) 0.1 % Apply 1 Application topically 2 (two) times daily. (Patient taking differently: Apply 1 Application topically as needed.)   No facility-administered encounter medications on file as of 09/03/2024.    Allergies (verified) Methocarbamol, Other, Poison ivy extract, Raloxifene  hcl, Repatha  [evolocumab ], Systane [polyethyl glycol-propyl glycol], Tamoxifen, Wasp venom protein, Xiidra [lifitegrast], and Mango flavoring agent (non-screening)   History: Past Medical History:  Diagnosis Date   Allergy    specified in earlier questions   Cataract    removed late summer 2021   Dry eye    Dysplastic nevus 08/04/2020   R lat buttock - moderate   Dysplastic nevus 08/23/2022   left central upper abdomen, moderate atypia   Dysplastic nevus 08/23/2022   right central upper abdomen, shave removal 09/28/2022   Dyspnea    with  exertion   Endometriosis    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 Mancil   Hypercalcemia    Hyperlipidemia    Has history of this   Motion sickness    Nodule of vagina    Osteoporosis    Parkinson's disease (HCC) 03/2021   Redundant colon    SOB (shortness of breath)    Past Surgical History:  Procedure Laterality Date   ABDOMINAL HYSTERECTOMY  1994   Total including ovarie. Due to endometriosis   APPENDECTOMY     with hysterectomy    BREAST BIOPSY Left 11/08/2012   Dr. Dessa, neg   CATARACT EXTRACTION W/PHACO Right 05/27/2020   Procedure: CATARACT EXTRACTION PHACO AND INTRAOCULAR LENS PLACEMENT (IOC) RIGHT PANOPTIC LENS;  Surgeon: Mittie Gaskin, MD;  Location: [facility];  Service: Ophthalmology;  Laterality: Right;  2.14 0:43.8 4.8%   CATARACT EXTRACTION W/PHACO Left 07/22/2020   Procedure: CATARACT EXTRACTION PHACO AND INTRAOCULAR LENS PLACEMENT (IOC) LEFT PANOPTIX LENS 5.31 00:49.1 10.8%;  Surgeon: Mittie Gaskin, MD;  Location: [facility];  Service: Ophthalmology;  Laterality: Left;   EYE SURGERY  2021   cataracts   granular cell tumor removed     OTHER SURGICAL HISTORY     Surgical Lipoma Removal; 12/13/2012  12/13/2012   VULVAR LESION REMOVAL Left 08/22/2018   Procedure: Excision left vulvar mass, possible sentinel node mapping with blue dye, possible left inguinal lymph node biopsy or dissection;  Surgeon: Mancil Barter, MD;  Location: [facility];  Service: Gynecology;  Laterality: Left;   Family History  Problem Relation Age of Onset   Cancer Mother        lung, pancreas, melanoma, carcinoid tumors   Hypertension Mother    Hyperlipidemia Mother    Diabetes Mother    Hearing loss Mother    Vision loss Mother    Hypertension Father    Diabetes Father    Hyperlipidemia Father    Parkinson's disease Father    CAD Father    Vascular Disease Father        hardening of the arteries   Hearing loss Father    Heart disease  Father    Diabetes Brother    Hypertension Brother    ADD / ADHD Brother    Alcohol abuse Brother    Cancer Brother    Breast cancer Sister 30   Arthritis Sister    Hypertension Sister    Dupuytren's contracture Sister    Cancer Sister    Early death Maternal Grandfather    Heart disease Maternal Grandmother    Heart disease Paternal Grandfather    Early death Paternal Grandmother    Heart disease Paternal Grandmother    Diabetes Brother    Hearing loss Brother    Hyperlipidemia Brother    Obesity Brother    Social History   Socioeconomic History   Marital status: Married    Spouse name: Not on file   Number of children: 0   Years of education: Not on file   Highest education level: Bachelor's degree (e.g., BA, AB, BS)  Occupational History   Occupation: retired  Tobacco Use   Smoking status: Never   Smokeless tobacco: Never  Vaping Use   Vaping status: Never Used  Substance and Sexual Activity   Alcohol use: Yes    Alcohol/week: 1.0 standard drink of alcohol    Types: 1 Glasses of wine per week    Comment: 1 glass of wine on Friday.   Drug use: Never   Sexual activity: Not Currently    Birth control/protection: Surgical    Comment: Hysterecetomy  Other Topics Concern   Not on file  Social History Narrative   Not on file   Social Drivers of Health   Financial Resource Strain: Low Risk  (09/02/2024)   Overall Financial Resource Strain (CARDIA)    Difficulty of Paying Living Expenses: Not hard at all  Food Insecurity: No Food Insecurity (09/02/2024)   Hunger Vital Sign    Worried About Running Out of Food in the Last Year: Never true    Ran Out of Food in the Last Year: Never true  Transportation Needs: No Transportation Needs (09/02/2024)   PRAPARE - Administrator, Civil Service (Medical): No    Lack of Transportation (Non-Medical): No  Physical Activity: Sufficiently Active (09/02/2024)   Exercise Vital Sign    Days of Exercise per Week: 5  days    Minutes of Exercise per Session: 30 min  Stress: Patient Declined (09/02/2024)  [employer] of Occupational Health - Occupational Stress Questionnaire    Feeling of Stress: Patient declined  Social Connections: Moderately Integrated (09/02/2024)   Social Connection and Isolation Panel    Frequency of Communication with Friends and Family: Twice a week    Frequency of Social Gatherings with Friends and Family: More than three times a week    Attends Religious Services: Patient declined    Database Administrator or Organizations: Yes    Attends Engineer, Structural: More than 4 times per year    Marital Status: Married    Tobacco Counseling Counseling given: Not Answered   Clinical Intake:   Activities of Daily Living    09/03/2024    2:40 PM  In your present state of health, do you have any difficulty performing the following activities:  Hearing? 0  Vision? 0  Comment Does not have peripheral vision as she would like.  Difficulty concentrating or making decisions? 1  Walking or climbing stairs? 1  Comment Going down stairs, has issues.  Dressing or bathing? 0  Doing errands, shopping? 0  Preparing Food and eating ? N  Using the Toilet? N  In the past six months, have you accidently leaked urine? Y  Do you have problems with loss of bowel control? N  Managing your Medications? N  Managing your Finances? N  Housekeeping or managing your Housekeeping? N    Patient Care Team: Donzella Lauraine SAILOR, DO as PCP - General (Family Medicine) End, Lonni, MD as PCP - Cardiology (Cardiology) Jackquline Sawyer, MD as Consulting Physician (Dermatology) Tamea Dedra CROME, MD as Consulting Physician (Pulmonary Disease) Blair Mt, MD as Referring Physician (Otolaryngology) Mittie Gaskin, MD as Referring Physician (Ophthalmology) Dingeldein, Elspeth, MD (Ophthalmology)  Indicate any recent Medical Services you may have received from other than [facility]  providers in the past year (date may be approximate).     Assessment:   This is a routine wellness examination for [place].  Hearing/Vision screen No results found.   Goals Addressed   None    Depression Screen    09/03/2024    2:38 PM 04/05/2024    2:53 PM 02/23/2024    2:23 PM 10/25/2023    3:04 PM 09/01/2023   10:48 AM 06/14/2023    2:28 PM 08/25/2022   11:10 AM  PHQ 2/9 Scores  PHQ - 2 Score 0 0 0 0 0 0 0  PHQ- 9 Score 1 2 1  0  1 3    Fall Risk    09/03/2024    2:38 PM 08/30/2023   11:23 AM 08/28/2023    5:59 AM 06/14/2023    2:28 PM 08/25/2022   11:09 AM  Fall Risk   Falls in the past year? 0 0 0 0 1  Number falls in past yr: 0 0 0 0 1  Injury with Fall? 0 0 0 0 1  Risk for fall due to : No Fall Risks    History of fall(s)  Follow up     Falls evaluation completed      Data saved with a previous flowsheet row definition    MEDICARE RISK AT HOME:    TIMED UP AND GO:  Was the test performed?  No    Cognitive Function:        09/03/2024    2:43 PM 09/01/2023   11:27 AM 10/18/2017    2:38 PM 10/05/2016   11:32 AM  6CIT Screen  What Year? 0 points 0  points 0 points 0 points  What month? 0 points 0 points 0 points 0 points  What time? 0 points 0 points 0 points 0 points  Count back from 20 0 points 0 points 0 points 0 points  Months in reverse 0 points 0 points 0 points 0 points  Repeat phrase 0 points 0 points 0 points 0 points  Total Score 0 points 0 points 0 points 0 points    Immunizations Immunization History  Administered Date(s) Administered   Fluad Quad(high Dose 65+) 08/25/2022, 09/20/2023   Hepatitis A, Adult 09/20/2017, 04/05/2018   INFLUENZA, HIGH DOSE SEASONAL PF 10/17/2016, 09/03/2018   Influenza Split 09/06/2011, 09/11/2012   Influenza,inj,Quad PF,6+ Mos 09/13/2013, 12/07/2015, 08/23/2017   Influenza-Unspecified 09/13/2013, 12/07/2015, 10/17/2016, 08/23/2017, 08/26/2020, 08/18/2021   PFIZER Comirnaty(Gray Top)Covid-19 Tri-Sucrose  Vaccine 08/26/2020, 08/18/2021   PFIZER(Purple Top)SARS-COV-2 Vaccination 01/03/2020, 01/28/2020, 08/26/2020   Pfizer Covid-19 Vaccine Bivalent Booster 51yrs & up 08/09/2023   Pfizer(Comirnaty)Fall Seasonal Vaccine 12 years and older 08/07/2024   Pneumococcal Conjugate-13 10/17/2016   Pneumococcal Polysaccharide-23 10/18/2017   Tdap 09/02/2010, 02/11/2021   Typhoid Inactivated 09/20/2017   Zoster Recombinant(Shingrix) 10/23/2018, 03/20/2019   Zoster, Live 02/05/2014    TDAP status: Up to date  Flu Vaccine status: Up to date  Pneumococcal vaccine status: Up to date  Covid-19 vaccine status: Completed vaccines  Qualifies for Shingles Vaccine? Yes   Zostavax completed Yes   Shingrix Completed?: Yes  Screening Tests Health Maintenance  Topic Date Due   COVID-19 Vaccine (10 - Pfizer risk 2025-26 season) 02/04/2025   Mammogram  06/27/2025   Medicare Annual Wellness (AWV)  09/03/2025   DEXA SCAN  01/25/2026   Fecal DNA (Cologuard)  05/07/2027   DTaP/Tdap/Td (3 - Td or Tdap) 02/12/2031   Pneumococcal Vaccine: 50+ Years  Completed   Influenza Vaccine  Completed   Hepatitis C Screening  Completed   Zoster Vaccines- Shingrix  Completed   Meningococcal B Vaccine  Aged Out   Colonoscopy  Discontinued    Health Maintenance  There are no preventive care reminders to display for this patient.   Colorectal cancer screening: Type of screening: Cologuard. Completed 05/06/2024. Repeat every 3 years  Mammogram status: Completed 06/27/2024. Repeat every year  Bone Density status: Completed 01/26/2024. Results reflect: Bone density results: OSTEOPENIA. Repeat every 2 years.   Lung Cancer Screening: (Low Dose CT Chest recommended if Age 45-80 years, 20 pack-year currently smoking OR have quit w/in 15years.) does not qualify.   Lung Cancer Screening Referral: N/A  Additional Screening:  Hepatitis C Screening: does not qualify; Completed N/A  Vision Screening: Recommended annual  ophthalmology exams for early detection of glaucoma and other disorders of the eye. Is the patient up to date with their annual eye exam?  No  Who is the provider or what is the name of the office in which the patient attends annual eye exams? Dr. Ronelle in [place], [place].  - had dry eyes and cataract surgery.  Dental Screening: Recommended annual dental exams for proper oral hygiene  Diabetic Foot Exam: N/A  Community Resource Referral / Chronic Care Management: CRR required this visit?  No   CCM required this visit?  No     Plan:    Medicare annual wellness visit Annual Medicare wellness visit completed. Visual difficulties and dry eyes noted post-cataract surgery.  Patient is up-to-date with vaccinations. - Retrieve DEXA scan results. - Continue current medications. - No recent changes to alcohol consumption. - No lung cancer screening needed.  Chronic cough and exertional dyspnea Persistent symptoms with small lung nodules likely infectious or inflammatory. Previous trials of PPIs and allergy medications ineffective. Hesitant to use PPIs due to side effects and lack of definitive diagnosis. - Continue avoiding spicy foods.  Status post cataract surgery with visual difficulties and dry eyes Ongoing visual difficulties and dry eyes post-surgery. Difficulty reading small print and focusing on curved surfaces. - Consider consultation with cataract specialist for visual difficulties.  Right thumb triggering and pain (possible trigger thumb) Right thumb triggering and pain with previous steroid injection of uncertain efficacy. Not interested in surgical intervention unless condition worsens significantly.  Hyperlipidemia Managed with bempedoic acid  (Nexletol ) due to previous issues with statins and injectables. Prefers current medication due to tolerability and effectiveness. Declined combination medication due to side effects concerns. - Continue bempedoic acid   (Nexletol ).  Hypertension Blood pressure 150 mmHg in office, home readings 125 mmHg. Suspected white coat hypertension. - Bring home blood pressure cuff to next visit for comparison.   I have personally reviewed and noted the following in the patient's chart:   Medical and social history Use of alcohol, tobacco or illicit drugs  Current medications and supplements including opioid prescriptions. Patient is not currently taking opioid prescriptions. Functional ability and status Nutritional status Physical activity Advanced directives List of other physicians Hospitalizations, surgeries, and ER visits in previous 12 months Vitals Screenings to include cognitive, depression, and falls Referrals and appointments  In addition, I have reviewed and discussed with patient certain preventive protocols, quality metrics, and best practice recommendations. A written personalized care plan for preventive services as well as general preventive health recommendations were provided to patient.     Dyshawn Cangelosi N Aayliah Rotenberry, DO   09/22/2024   After Visit Summary: (In Person-Declined) Patient declined AVS at this time. She will check it online.

## 2024-09-03 NOTE — Patient Instructions (Addendum)
 Fat and Cholesterol Restricted Eating Plan Eating a diet that limits fat and cholesterol may help lower your risk for heart disease and other conditions. Your body needs fat and cholesterol for basic functions, but eating too much of these things can be harmful to your health. General guidelines If you are overweight, work with your health care provider to lose weight safely. Losing just 5-10% of your body weight can improve your overall health and help prevent diseases such as diabetes and heart disease. Avoid: Foods with added sugar. Fried foods. Foods that contain partially hydrogenated oils, including stick margarine, some tub margarines, cookies, crackers, and other baked goods. If you drink alcohol: Limit how much you have to: 0-1 drink a day for women who are not pregnant. 0-2 drinks a day for men. Know how much alcohol is in a drink. In the [place] one drink equals one 12 oz bottle of beer (355 mL), one 5 oz glass of wine (148 mL), or one 1 oz glass of hard liquor (44 mL). Reading food labels Check food labels for: Trans fats or partially hydrogenated oils. Avoid foods that contain these. High amounts of saturated fat. Choose foods that are low in saturated fat (less than 2 g). The amount of cholesterol in each serving. The amount of fiber in each serving. Choose foods with healthy fats, such as: Monounsaturated and polyunsaturated fats. These include olive and canola oil, flaxseeds, walnuts, almonds, and seeds. Omega-3 fats. These are found in foods such as salmon, mackerel, sardines, tuna, flaxseed oil, and ground flaxseeds. Choose grain products that have whole grains. Look for the word whole as the first word in the ingredient list. Cooking Cook foods using methods other than frying. Baking, boiling, grilling, and broiling are some healthy options. Eat more home-cooked food and less restaurant, buffet, and fast food. Avoid cooking using saturated fats. Animal sources of  saturated fats include meats, butter, and cream. Plant sources of saturated fats include palm oil, palm kernel oil, and coconut oil. Meal planning  At meals, imagine dividing your plate into fourths: Fill one-half of your plate with vegetables, green salads, and fruit. Fill one-fourth of your plate with whole grains. Fill one-fourth of your plate with lean protein foods. Eat fish that is high in omega-3 fats at least two times a week. Eat more foods that contain fiber, such as whole grains, beans, apples, pears, berries, broccoli, carrots, peas, and barley. These foods help promote healthy cholesterol levels in the blood. What foods should I eat? Fruits All fresh, canned (in natural juice), or frozen fruits. Vegetables Fresh or frozen vegetables (raw, steamed, roasted, or grilled). Green salads. Grains Whole grains, such as whole wheat or whole grain breads, crackers, cereals, and pasta. Unsweetened oatmeal, bulgur, barley, quinoa, or brown rice. Corn or whole wheat flour tortillas. Meats and other proteins Ground beef (85% or leaner), grass-fed beef, or beef trimmed of fat. Skinless chicken or [place]. Ground chicken or [place]. Pork trimmed of fat. All fish and seafood. Egg whites. Dried beans, peas, or lentils. Unsalted nuts or seeds. Unsalted canned beans. Natural nut butters without added sugar and oil. Dairy Low-fat or nonfat dairy products, such as skim or 1% milk, 2% or reduced-fat cheeses, low-fat and fat-free ricotta or cottage cheese, or plain low-fat and nonfat yogurt. Fats and oils Tub margarine without trans fats. Light or reduced-fat mayonnaise and salad dressings. Avocado. Olive, canola, sesame, or safflower oils. The items listed above may not be a complete list of foods and beverages  you can eat. Contact a dietitian for more information. What foods should I avoid? Fruits Canned fruit in heavy syrup. Fruit in cream or butter sauce. Fried fruit. Vegetables Vegetables cooked  in cheese, cream, or butter sauce. Fried vegetables. Grains White bread. White pasta. White rice. Cornbread. Bagels, pastries, and croissants. Crackers and snack foods that contain trans fat and hydrogenated oils. Meats and other proteins Fatty cuts of meat. Ribs, chicken wings, bacon, sausage, bologna, salami, chitterlings, fatback, hot dogs, bratwurst, and packaged lunch meats. Liver and organ meats. Whole eggs and egg yolks. Chicken and [place] with skin. Fried meat. Dairy Whole or 2% milk, cream, half-and-half, and cream cheese. Whole milk cheeses. Whole-fat or sweetened yogurt. Full-fat cheeses. Nondairy creamers and whipped toppings. Processed cheese, cheese spreads, and cheese curds. Fats and oils Butter, stick margarine, lard, shortening, ghee, or bacon fat. Coconut, palm kernel, and palm oils. Beverages Alcohol. Sugar-sweetened drinks such as sodas, lemonade, and fruit drinks. Sweets and desserts Corn syrup, sugars, honey, and molasses. Candy. Jam and jelly. Syrup. Sweetened cereals. Cookies, pies, cakes, donuts, muffins, and ice cream. The items listed above may not be a complete list of foods and beverages you should avoid. Contact a dietitian for more information. Summary Your body needs fat and cholesterol for basic functions. However, eating too much of these things can be harmful to your health. Work with your health care provider and dietitian to follow a diet that limits fat and cholesterol. Doing this may help lower your risk for heart disease and other conditions. Choose healthy fats, such as monounsaturated and polyunsaturated fats, and foods high in omega-3 fatty acids. Eat fiber-rich foods, such as whole grains, beans, peas, fruits, and vegetables. Limit or avoid alcohol, fried foods, and foods high in saturated fats, partially hydrogenated oils, and sugar. This information is not intended to replace advice given to you by your health care provider. Make sure you discuss any  questions you have with your health care provider. Document Revised: 03/19/2021 Document Reviewed: 03/19/2021 Elsevier Patient Education  2024 [employer]. Fall Prevention in the Home, Adult Falls can cause injuries and can happen to people of all ages. There are many things you can do to make your home safer and to help prevent falls. What actions can I take to prevent falls? General information Use good lighting in all rooms. Make sure to: Replace any light bulbs that burn out. Turn on the lights in dark areas and use night-lights. Keep items that you use often in easy-to-reach places. Lower the shelves around your home if needed. Move furniture so that there are clear paths around it. Do not use throw rugs or other things on the floor that can make you trip. If any of your floors are uneven, fix them. Add color or contrast paint or tape to clearly mark and help you see: Grab bars or handrails. First and last steps of staircases. Where the edge of each step is. If you use a ladder or stepladder: Make sure that it is fully opened. Do not climb a closed ladder. Make sure the sides of the ladder are locked in place. Have someone hold the ladder while you use it. Know where your pets are as you move through your home. What can I do in the bathroom?     Keep the floor dry. Clean up any water on the floor right away. Remove soap buildup in the bathtub or shower. Buildup makes bathtubs and showers slippery. Use non-skid mats or decals on  the floor of the bathtub or shower. Attach bath mats securely with double-sided, non-slip rug tape. If you need to sit down in the shower, use a non-slip stool. Install grab bars by the toilet and in the bathtub and shower. Do not use towel bars as grab bars. What can I do in the bedroom? Make sure that you have a light by your bed that is easy to reach. Do not use any sheets or blankets on your bed that hang to the floor. Have a firm chair or bench  with side arms that you can use for support when you get dressed. What can I do in the kitchen? Clean up any spills right away. If you need to reach something above you, use a step stool with a grab bar. Keep electrical cords out of the way. Do not use floor polish or wax that makes floors slippery. What can I do with my stairs? Do not leave anything on the stairs. Make sure that you have a light switch at the top and the bottom of the stairs. Make sure that there are handrails on both sides of the stairs. Fix handrails that are broken or loose. Install non-slip stair treads on all your stairs if they do not have carpet. Avoid having throw rugs at the top or bottom of the stairs. Choose a carpet that does not hide the edge of the steps on the stairs. Make sure that the carpet is firmly attached to the stairs. Fix carpet that is loose or worn. What can I do on the outside of my home? Use bright outdoor lighting. Fix the edges of walkways and driveways and fix any cracks. Clear paths of anything that can make you trip, such as tools or rocks. Add color or contrast paint or tape to clearly mark and help you see anything that might make you trip as you walk through a door, such as a raised step or threshold. Trim any bushes or trees on paths to your home. Check to see if handrails are loose or broken and that both sides of all steps have handrails. Install guardrails along the edges of any raised decks and porches. Have leaves, snow, or ice cleared regularly. Use sand, salt, or ice melter on paths if you live where there is ice and snow during the winter. Clean up any spills in your garage right away. This includes grease or oil spills. What other actions can I take? Review your medicines with your doctor. Some medicines can cause dizziness or changes in blood pressure, which increase your risk of falling. Wear shoes that: Have a low heel. Do not wear high heels. Have rubber bottoms and are  closed at the toe. Feel good on your feet and fit well. Use tools that help you move around if needed. These include: Canes. Walkers. Scooters. Crutches. Ask your doctor what else you can do to help prevent falls. This may include seeing a physical therapist to learn to do exercises to move better and get stronger. Where to find more information Centers for Disease Control and Prevention, STEAD[employer]: TonerPromos.no [employer] on Aging: BaseRingTones.pl National Institute on Aging: BaseRingTones.pl Contact a doctor if: You are afraid of falling at home. You feel weak, drowsy, or dizzy at home. You fall at home. Get help right away if you: Lose consciousness or have trouble moving after a fall. Have a fall that causes a head injury. These symptoms may be an emergency. Get help right away. Call 911. Do  not wait to see if the symptoms will go away. Do not drive yourself to the hospital. This information is not intended to replace advice given to you by your health care provider. Make sure you discuss any questions you have with your health care provider. Document Revised: 07/11/2022 Document Reviewed: 07/11/2022 Elsevier Patient Education  2024 Elsevier Inc. Health Maintenance, Female Adopting a healthy lifestyle and getting preventive care are important in promoting health and wellness. Ask your health care provider about: The right schedule for you to have regular tests and exams. Things you can do on your own to prevent diseases and keep yourself healthy. What should I know about diet, weight, and exercise? Eat a healthy diet  Eat a diet that includes plenty of vegetables, fruits, low-fat dairy products, and lean protein. Do not eat a lot of foods that are high in solid fats, added sugars, or sodium. Maintain a healthy weight Body mass index (BMI) is used to identify weight problems. It estimates body fat based on height and weight. Your health care provider can help determine your BMI and help  you achieve or maintain a healthy weight. Get regular exercise Get regular exercise. This is one of the most important things you can do for your health. Most adults should: Exercise for at least 150 minutes each week. The exercise should increase your heart rate and make you sweat (moderate-intensity exercise). Do strengthening exercises at least twice a week. This is in addition to the moderate-intensity exercise. Spend less time sitting. Even light physical activity can be beneficial. Watch cholesterol and blood lipids Have your blood tested for lipids and cholesterol at 73 years of age, then have this test every 5 years. Have your cholesterol levels checked more often if: Your lipid or cholesterol levels are high. You are older than 73 years of age. You are at high risk for heart disease. What should I know about cancer screening? Depending on your health history and family history, you may need to have cancer screening at various ages. This may include screening for: Breast cancer. Cervical cancer. Colorectal cancer. Skin cancer. Lung cancer. What should I know about heart disease, diabetes, and high blood pressure? Blood pressure and heart disease High blood pressure causes heart disease and increases the risk of stroke. This is more likely to develop in people who have high blood pressure readings or are overweight. Have your blood pressure checked: Every 3-5 years if you are 6-63 years of age. Every year if you are 78 years old or older. Diabetes Have regular diabetes screenings. This checks your fasting blood sugar level. Have the screening done: Once every three years after age 41 if you are at a normal weight and have a low risk for diabetes. More often and at a younger age if you are overweight or have a high risk for diabetes. What should I know about preventing infection? Hepatitis B If you have a higher risk for hepatitis B, you should be screened for this virus. Talk  with your health care provider to find out if you are at risk for hepatitis B infection. Hepatitis C Testing is recommended for: Everyone born from [birth year] through 1965. Anyone with known risk factors for hepatitis C. Sexually transmitted infections (STIs) Get screened for STIs, including gonorrhea and chlamydia, if: You are sexually active and are younger than 73 years of age. You are older than 73 years of age and your health care provider tells you that you are at risk for this  type of infection. Your sexual activity has changed since you were last screened, and you are at increased risk for chlamydia or gonorrhea. Ask your health care provider if you are at risk. Ask your health care provider about whether you are at high risk for HIV. Your health care provider may recommend a prescription medicine to help prevent HIV infection. If you choose to take medicine to prevent HIV, you should first get tested for HIV. You should then be tested every 3 months for as long as you are taking the medicine. Pregnancy If you are about to stop having your period (premenopausal) and you may become pregnant, seek counseling before you get pregnant. Take 400 to 800 micrograms (mcg) of folic acid every day if you become pregnant. Ask for birth control (contraception) if you want to prevent pregnancy. Osteoporosis and menopause Osteoporosis is a disease in which the bones lose minerals and strength with aging. This can result in bone fractures. If you are 64 years old or older, or if you are at risk for osteoporosis and fractures, ask your health care provider if you should: Be screened for bone loss. Take a calcium  or vitamin D  supplement to lower your risk of fractures. Be given hormone replacement therapy (HRT) to treat symptoms of menopause. Follow these instructions at home: Alcohol use Do not drink alcohol if: Your health care provider tells you not to drink. You are pregnant, may be pregnant, or are  planning to become pregnant. If you drink alcohol: Limit how much you have to: 0-1 drink a day. Know how much alcohol is in your drink. In the [place] one drink equals one 12 oz bottle of beer (355 mL), one 5 oz glass of wine (148 mL), or one 1 oz glass of hard liquor (44 mL). Lifestyle Do not use any products that contain nicotine or tobacco. These products include cigarettes, chewing tobacco, and vaping devices, such as e-cigarettes. If you need help quitting, ask your health care provider. Do not use street drugs. Do not share needles. Ask your health care provider for help if you need support or information about quitting drugs. General instructions Schedule regular health, dental, and eye exams. Stay current with your vaccines. Tell your health care provider if: You often feel depressed. You have ever been abused or do not feel safe at home. Summary Adopting a healthy lifestyle and getting preventive care are important in promoting health and wellness. Follow your health care provider's instructions about healthy diet, exercising, and getting tested or screened for diseases. Follow your health care provider's instructions on monitoring your cholesterol and blood pressure. This information is not intended to replace advice given to you by your health care provider. Make sure you discuss any questions you have with your health care provider. Document Revised: 03/29/2021 Document Reviewed: 03/29/2021 Elsevier Patient Education  2024 [employer].

## 2024-09-20 ENCOUNTER — Encounter: Payer: Self-pay | Admitting: Physician Assistant

## 2024-09-20 ENCOUNTER — Ambulatory Visit: Admitting: Physician Assistant

## 2024-09-20 VITALS — BP 145/57 | HR 66 | Resp 14 | Ht 67.0 in | Wt 139.2 lb

## 2024-09-20 DIAGNOSIS — G20C Parkinsonism, unspecified: Secondary | ICD-10-CM

## 2024-09-20 DIAGNOSIS — L989 Disorder of the skin and subcutaneous tissue, unspecified: Secondary | ICD-10-CM | POA: Diagnosis not present

## 2024-09-20 NOTE — Progress Notes (Addendum)
 Established patient visit  Patient: Barbara Gates   DOB: Oct 16, 1951   73 y.o. Female  MRN: [id number] Visit Date: 09/20/2024  Today's healthcare provider: Jolynn Spencer, PA-C   Chief Complaint  Patient presents with   Arm lesion    R forearm lesion, with mild discomfort to the touch onset a few months unsure how long. Has a lot of cats at home. One swatted her on arm x 1 month. Noticed pus under skin. Denies of pain.  Sister told her possible skin cancer   Subjective      Discussed the use of AI scribe software for clinical note transcription with the patient, who gave verbal consent to proceed.  History of Present Illness Barbara Gates is a 73 year old female with Parkinson's disease who presents with a skin lesion on her arm.  She has a skin lesion on her arm, suspected to be related to a cat scratch received a month ago. The lesion is occasionally painful when pressed. Attempts to squeeze it have resulted in redness but no discharge. Various lotions have been applied without significant change. The lesion took over a month to heal after the initial scratch.  She was diagnosed with Parkinson's disease three and a half years ago following multiple falls. Her current medications include phosphatidylserine, medications for dry eyes, and cholesterol management. She avoids carbidopa/levodopa and is not on any prescription medications specifically for Parkinson's disease.       09/03/2024    2:38 PM 04/05/2024    2:53 PM 02/23/2024    2:23 PM  Depression screen PHQ 2/9  Decreased Interest 0 0 0  Down, Depressed, Hopeless 0 0 0  PHQ - 2 Score 0 0 0  Altered sleeping 1 1 1   Tired, decreased energy 0 0 0  Change in appetite 0 0 0  Feeling bad or failure about yourself  0 0 0  Trouble concentrating 0 1 0  Moving slowly or fidgety/restless 0 0 0  Suicidal thoughts 0 0 0  PHQ-9 Score 1 2 1   Difficult doing work/chores Not difficult at all        09/03/2024     2:38 PM 04/05/2024    2:53 PM 02/23/2024    2:24 PM 10/25/2023    3:04 PM  GAD 7 : Generalized Anxiety Score  Nervous, Anxious, on Edge 0 0 0 0  Control/stop worrying 0 0 0 0  Worry too much - different things 0 0 0 0  Trouble relaxing 1 0 1 1  Restless 0 0 0 0  Easily annoyed or irritable 1 1 1  0  Afraid - awful might happen 0 1 0 0  Total GAD 7 Score 2 2 2 1   Anxiety Difficulty Not difficult at all Not difficult at all Not difficult at all Not difficult at all    Medications: Outpatient Medications Prior to Visit  Medication Sig   Bempedoic Acid  180 MG TABS Take 1 tablet (180 mg total) by mouth daily.   Coenzyme Q10 10 MG capsule Take 1 capsule by mouth daily.   estradiol  (ESTRACE  VAGINAL) 0.1 MG/GM vaginal cream Place 1 Applicatorful vaginally at bedtime. May also be applied directly to vulvar and vestibular tissues. Initial management once daily for 2 weeks. Maintenance: one to three times per week (Patient taking differently: Place 1 Applicatorful vaginally as needed. May also be applied directly to vulvar and vestibular tissues. Initial management once daily for 2 weeks. Maintenance: one to three times per week)  Flaxseed, Linseed, (FLAX SEED OIL) 1000 MG CAPS Take 1 capsule by mouth daily.   levOCARNitine (L-CARNITINE) 500 MG TABS Take 500 mg by mouth daily.   Lysine 600 MG TABS Take 1 tablet by mouth every other day.   Magnesium 250 MG TABS Take 2 tablets by mouth daily.    MAGNESIUM GLYCINATE PO Take by mouth.   Multiple Vitamins-Minerals (CENTRUM SILVER 50+WOMEN) TABS Take by mouth.   Perfluorohexyloctane (MIEBO OP) Apply 3 drops to eye daily.   PHOSPHATIDYLSERINE PO daily. (Patient taking differently: Take 2 capsules by mouth daily.)   senna-docusate (SENOKOT-S) 8.6-50 MG tablet Take 2 tablets by mouth every evening.  (Patient taking differently: Take 1 tablet by mouth every evening.)   thiamine (VITAMIN B-1) 50 MG tablet Take 50 mg by mouth daily.   tretinoin  (RETIN-A ) 0.05 %  cream Apply topically at bedtime. (Patient taking differently: Apply topically as needed.)   triamcinolone  cream (KENALOG ) 0.1 % Apply 1 Application topically 2 (two) times daily. (Patient taking differently: Apply 1 Application topically as needed.)   No facility-administered medications prior to visit.    Review of Systems All negative Except see HPI       Objective    BP (!) 145/57   Pulse 66   Resp 14   Ht 5' 7 (1.702 m)   Wt 139 lb 3.2 oz (63.1 kg)   SpO2 100%   BMI 21.80 kg/m     Physical Exam Constitutional:      General: She is not in acute distress.    Appearance: Normal appearance.  HENT:     Head: Normocephalic.  Pulmonary:     Effort: Pulmonary effort is normal. No respiratory distress.  Skin:    Findings: Lesion present.  Neurological:     Mental Status: She is alert and oriented to person, place, and time. Mental status is at baseline.      No results found for any visits on 09/20/24.      Assessment & Plan Benign post-traumatic skin lesion Lesion likely benign post-traumatic, possibly from cat scratch. Differential includes skin cancer, but benign assessment prevails. No oozing or significant changes. - Referred to dermatology for evaluation.  Parkinson's disease Diagnosed 3.5 years ago. Manages symptoms with boxing and exercise. Not on carbidopa, uses phosphatidylserine Aware of genetic predisposition. - Continue exercise regimen, including boxing and classes. Follow-up with neurology    No orders of the defined types were placed in this encounter.   No follow-ups on file.   The patient was advised to call back or seek an in-person evaluation if the symptoms worsen or if the condition fails to improve as anticipated.  I discussed the assessment and treatment plan with the patient. The patient was provided an opportunity to ask questions and all were answered. The patient agreed with the plan and demonstrated an understanding of the  instructions.  I, Alesi Zachery, PA-C have reviewed all documentation for this visit. The documentation on 09/20/2024  for the exam, diagnosis, procedures, and orders are all accurate and complete. I spent 20 minutes dedicated to the care of this patient on the date of this encounter to include pre-visit review of records, face-to-face with the patient discussing condition/treatment of skin lesion, Parkinson's and post visit documentation.  Jolynn Spencer, [facility], MMS [facility] [phone] (phone) [phone] (fax)  [facility]

## 2024-10-01 ENCOUNTER — Ambulatory Visit: Payer: Medicare Other | Admitting: Dermatology

## 2024-10-03 ENCOUNTER — Encounter: Payer: Self-pay | Admitting: Dermatology

## 2024-10-03 ENCOUNTER — Ambulatory Visit: Admitting: Dermatology

## 2024-10-03 DIAGNOSIS — Z1283 Encounter for screening for malignant neoplasm of skin: Secondary | ICD-10-CM | POA: Diagnosis not present

## 2024-10-03 DIAGNOSIS — L814 Other melanin hyperpigmentation: Secondary | ICD-10-CM

## 2024-10-03 DIAGNOSIS — I781 Nevus, non-neoplastic: Secondary | ICD-10-CM

## 2024-10-03 DIAGNOSIS — L853 Xerosis cutis: Secondary | ICD-10-CM

## 2024-10-03 DIAGNOSIS — W908XXA Exposure to other nonionizing radiation, initial encounter: Secondary | ICD-10-CM

## 2024-10-03 DIAGNOSIS — L820 Inflamed seborrheic keratosis: Secondary | ICD-10-CM

## 2024-10-03 DIAGNOSIS — D1801 Hemangioma of skin and subcutaneous tissue: Secondary | ICD-10-CM

## 2024-10-03 DIAGNOSIS — C4492 Squamous cell carcinoma of skin, unspecified: Secondary | ICD-10-CM

## 2024-10-03 DIAGNOSIS — Z86018 Personal history of other benign neoplasm: Secondary | ICD-10-CM

## 2024-10-03 DIAGNOSIS — C44622 Squamous cell carcinoma of skin of right upper limb, including shoulder: Secondary | ICD-10-CM | POA: Diagnosis not present

## 2024-10-03 DIAGNOSIS — L720 Epidermal cyst: Secondary | ICD-10-CM

## 2024-10-03 DIAGNOSIS — D485 Neoplasm of uncertain behavior of skin: Secondary | ICD-10-CM

## 2024-10-03 DIAGNOSIS — L578 Other skin changes due to chronic exposure to nonionizing radiation: Secondary | ICD-10-CM

## 2024-10-03 DIAGNOSIS — L821 Other seborrheic keratosis: Secondary | ICD-10-CM

## 2024-10-03 DIAGNOSIS — D229 Melanocytic nevi, unspecified: Secondary | ICD-10-CM

## 2024-10-03 DIAGNOSIS — D224 Melanocytic nevi of scalp and neck: Secondary | ICD-10-CM

## 2024-10-03 DIAGNOSIS — D225 Melanocytic nevi of trunk: Secondary | ICD-10-CM

## 2024-10-03 HISTORY — DX: Squamous cell carcinoma of skin, unspecified: C44.92

## 2024-10-03 NOTE — Progress Notes (Signed)
 Follow-Up Visit   Subjective  Barbara Gates is a 73 y.o. female who presents for the following: Skin Cancer Screening and Full Body Skin Exam  The patient presents for Total-Body Skin Exam (TBSE) for skin cancer screening and mole check. The patient has spots, moles and lesions to be evaluated, some may be new or changing, including right forearm (x 2 mos), left chin, itchy spot on the right lower leg.  Pt has h/o Parkinson's Disease  The following portions of the chart were reviewed this encounter and updated as appropriate: medications, allergies, medical history  Review of Systems:  No other skin or systemic complaints except as noted in HPI or Assessment and Plan.  Objective  Well appearing patient in no apparent distress; mood and affect are within normal limits.  A full examination was performed including scalp, head, eyes, ears, nose, lips, neck, chest, axillae, abdomen, back, buttocks, bilateral upper extremities, bilateral lower extremities, hands, feet, fingers, toes, fingernails, and toenails. All findings within normal limits unless otherwise noted below.   Relevant physical exam findings are noted in the Assessment and Plan.  R forearm 9.0 mm pink firm papule with central plug  spinal lower back at waistline Erythematous stuck-on, waxy plaque  Assessment & Plan   SKIN CANCER SCREENING PERFORMED TODAY.  ACTINIC DAMAGE - Chronic condition, secondary to cumulative UV/sun exposure - diffuse scaly erythematous macules with underlying dyspigmentation - Recommend daily broad spectrum sunscreen SPF 30+ to sun-exposed areas, reapply every 2 hours as needed.  - Staying in the shade or wearing long sleeves, sun glasses (UVA+UVB protection) and wide brim hats (4-inch brim around the entire circumference of the hat) are also recommended for sun protection.  - Call for new or changing lesions.  LENTIGINES, SEBORRHEIC KERATOSES, HEMANGIOMAS - Benign normal skin lesions -  Benign-appearing - Call for any changes  MELANOCYTIC NEVI - Tan-brown and/or pink-flesh-colored symmetric macules and papules - Right lower flank 4 x 2 mm med dark brown macule  -Central lower abdomen 5 x 4 mm speckled brown macule - Left medial breast and left posterior neck - flesh papules  - Left post lower neck 5 x 3 mm adjacent speckled brown macules x2, lighter medial - Benign appearing on exam today - Observation - Call clinic for new or changing moles - Recommend daily use of broad spectrum spf 30+ sunscreen to sun-exposed areas.   HISTORY OF DYSPLASTIC NEVI No evidence of recurrence today Recommend regular full body skin exams Recommend daily broad spectrum sunscreen SPF 30+ to sun-exposed areas, reapply every 2 hours as needed.  Call if any new or changing lesions are noted between office visits  TELANGIECTASIA Exam: blanchable violaceous macule at the right hand dorsum, no scale, Benign features under dermoscopy.   Treatment Plan: Benign appearing on exam Call for changes  Xerosis - diffuse xerotic patches - recommend gentle, hydrating skin care - gentle skin care handout given  Milia - tiny firm white papules on the chin - type of cyst - benign - sometimes these will clear with nightly OTC adapalene/Differin 0.1% gel or retinol. - may be extracted if symptomatic - observe  Recommend OTC adapalene 0.1% gel pea sized amount to entire face nightly as tolerated.  This can be used to treat acne (whiteheads, blackheads) and milia (tiny firm white cysts).  It may cause dry irritated skin with initial use, and to minimize this, we recommend applying a light moisturizer to face before applying adapalene and/or applying it less frequently.  OTC brands include Differin 0.1% gel (Galderma), Adapalene 0.1% gel (Neutrogena), and Effaclar gel ( La Roche Posay).  They are found in the acne section on the pharmacy.   NEOPLASM OF UNCERTAIN BEHAVIOR OF SKIN R forearm Epidermal /  dermal shaving  Lesion diameter (cm):  0.9 Informed consent: discussed and consent obtained   Patient was prepped and draped in usual sterile fashion: Area prepped with alcohol. Anesthesia: the lesion was anesthetized in a standard fashion   Anesthetic:  1% lidocaine  w/ epinephrine  1-100,000 buffered w/ 8.4% NaHCO3 Instrument used: flexible razor blade   Hemostasis achieved with: pressure, aluminum chloride and electrodesiccation   Outcome: patient tolerated procedure well    Destruction of lesion  Destruction method: electrodesiccation and curettage   Informed consent: discussed and consent obtained   Curettage performed in three different directions: Yes   Electrodesiccation performed over the curetted area: Yes   Final wound size (cm):  1 Hemostasis achieved with:  pressure, aluminum chloride and electrodesiccation Outcome: patient tolerated procedure well with no complications   Post-procedure details: wound care instructions given   Post-procedure details comment:  Ointment and bandage applied.  Specimen 1 - Surgical pathology Differential Diagnosis: r/o KA vs SCC Check Margins: No EDC today Shave removal and EDC today. INFLAMED SEBORRHEIC KERATOSIS spinal lower back at waistline Symptomatic, irritating  Patient defers cryotherapy today. May consider in the future if becomes more bothersome.    Return in about 6 months (around 04/02/2025) for f/u bx, spot check. Also TBSE in 1 year.  IAndrea Kerns, CMA, am acting as scribe for Rexene Rattler, MD .   Documentation: I have reviewed the above documentation for accuracy and completeness, and I agree with the above.  Rexene Rattler, MD

## 2024-10-03 NOTE — Patient Instructions (Addendum)
 Recommend OTC adapalene 0.1% gel pea sized amount to entire face nightly as tolerated.  This can be used to treat acne (whiteheads, blackheads) and milia (tiny firm white cysts).  It may cause dry irritated skin with initial use, and to minimize this, we recommend applying a light moisturizer to face before applying adapalene and/or applying it less frequently.  OTC brands include Differin 0.1% gel (Galderma), Adapalene 0.1% gel (Neutrogena), and Effaclar gel ( La Roche Posay).  They are found in the acne section on the pharmacy.   Wound Care Instructions  Cleanse wound gently with soap and water once a day then pat dry with clean gauze. Apply a thin coat of Petrolatum (petroleum jelly, Vaseline) over the wound (unless you have an allergy to this). We recommend that you use a new, sterile tube of Vaseline. Do not pick or remove scabs. Do not remove the yellow or white healing tissue from the base of the wound.  Cover the wound with fresh, clean, nonstick gauze and secure with paper tape. You may use Band-Aids in place of gauze and tape if the wound is small enough, but would recommend trimming much of the tape off as there is often too much. Sometimes Band-Aids can irritate the skin.  You should call the office for your biopsy report after 1 week if you have not already been contacted.  If you experience any problems, such as abnormal amounts of bleeding, swelling, significant bruising, significant pain, or evidence of infection, please call the office immediately.  FOR ADULT SURGERY PATIENTS: If you need something for pain relief you may take 1 extra strength Tylenol  (acetaminophen ) AND 2 Ibuprofen (200mg  each) together every 4 hours as needed for pain. (do not take these if you are allergic to them or if you have a reason you should not take them.) Typically, you may only need pain medication for 1 to 3 days.    Due to recent changes in healthcare laws, you may see results of your pathology  and/or laboratory studies on MyChart before the doctors have had a chance to review them. We understand that in some cases there may be results that are confusing or concerning to you. Please understand that not all results are received at the same time and often the doctors may need to interpret multiple results in order to provide you with the best plan of care or course of treatment. Therefore, we ask that you please give us  2 business days to thoroughly review all your results before contacting the office for clarification. Should we see a critical lab result, you will be contacted sooner.   If You Need Anything After Your Visit  If you have any questions or concerns for your doctor, please call our main line at [phone] and press option 4 to reach your doctor's medical assistant. If no one answers, please leave a voicemail as directed and we will return your call as soon as possible. Messages left after 4 pm will be answered the following business day.   You may also send us  a message via MyChart. We typically respond to MyChart messages within 1-2 business days.  For prescription refills, please ask your pharmacy to contact our office. Our fax number is [phone].  If you have an urgent issue when the clinic is closed that cannot wait until the next business day, you can page your doctor at the number below.    Please note that while we do our best to be available for urgent  issues outside of office hours, we are not available 24/7.   If you have an urgent issue and are unable to reach us , you may choose to seek medical care at your doctor's office, retail clinic, urgent care center, or emergency room.  If you have a medical emergency, please immediately call 911 or go to the emergency department.  Pager Numbers  - Dr. Hester: [phone]  - Dr. Jackquline: [phone]  - Dr. Claudene: [phone]   - Dr. Raymund: [phone]  In the event of inclement weather, please call our  main line at [phone] for an update on the status of any delays or closures.  Dermatology Medication Tips: Please keep the boxes that topical medications come in in order to help keep track of the instructions about where and how to use these. Pharmacies typically print the medication instructions only on the boxes and not directly on the medication tubes.   If your medication is too expensive, please contact our office at [phone] option 4 or send us  a message through MyChart.   We are unable to tell what your co-pay for medications will be in advance as this is different depending on your insurance coverage. However, we may be able to find a substitute medication at lower cost or fill out paperwork to get insurance to cover a needed medication.   If a prior authorization is required to get your medication covered by your insurance company, please allow us  1-2 business days to complete this process.  Drug prices often vary depending on where the prescription is filled and some pharmacies may offer cheaper prices.  The website www.goodrx.com contains coupons for medications through different pharmacies. The prices here do not account for what the cost may be with help from insurance (it may be cheaper with your insurance), but the website can give you the price if you did not use any insurance.  - You can print the associated coupon and take it with your prescription to the pharmacy.  - You may also stop by our office during regular business hours and pick up a GoodRx coupon card.  - If you need your prescription sent electronically to a different pharmacy, notify our office through [facility] or by phone at [phone] option 4.     Si Usted Necesita Algo Despus de Su Visita  Tambin puede enviarnos un mensaje a travs de Clinical Cytogeneticist. Por lo general respondemos a los mensajes de MyChart en el transcurso de 1 a 2 das hbiles.  Para renovar recetas, por favor pida a su  farmacia que se ponga en contacto con nuestra oficina. Randi lakes de fax es [place] [phone].  Si tiene un asunto urgente cuando la clnica est cerrada y que no puede esperar hasta el siguiente da hbil, puede llamar/localizar a su doctor(a) al nmero que aparece a continuacin.   Por favor, tenga en cuenta que aunque hacemos todo lo posible para estar disponibles para asuntos urgentes fuera del horario de [place], no estamos disponibles las 24 horas del da, los 7 [address]  [address] de la [place].   Si tiene un problema urgente y no puede comunicarse con nosotros, puede optar por buscar atencin mdica  en el consultorio de su doctor(a), en una clnica privada, en un centro de atencin urgente o en una sala de emergencias.  Si tiene engineer, drilling, por favor llame inmediatamente al 911 o vaya a la sala de emergencias.  Nmeros de bper  - Dr. Hester: [phone]  - Dra. Jackquline: [phone]  -  Dr. Claudene: [phone]  - Dra. Kitts: [phone]  En caso de inclemencias del [place], por favor llame a nuestra lnea principal al [phone] para una actualizacin sobre el estado de cualquier retraso o cierre.  Consejos para la medicacin en dermatologa: Por favor, guarde las cajas en las que vienen los medicamentos de uso tpico para ayudarle a seguir las instrucciones sobre dnde y cmo usarlos. Las farmacias generalmente imprimen las instrucciones del medicamento slo en las cajas y no directamente en los tubos del [place].   Si su medicamento es muy caro, por favor, pngase en contacto con landry rieger llamando al [phone] y presione la opcin 4 o envenos un mensaje a travs de Clinical Cytogeneticist.   No podemos decirle cul ser su copago por los medicamentos por adelantado ya que esto es diferente dependiendo de la cobertura de su seguro. Sin embargo, es posible que podamos encontrar un medicamento sustituto a audiological scientist un formulario para que el seguro cubra el medicamento  que se considera necesario.   Si se requiere una autorizacin previa para que su compaa de seguros cubra su medicamento, por favor permtanos de 1 a 2 das hbiles para completar este proceso.  Los precios de los medicamentos varan con frecuencia dependiendo del environmental consultant de dnde se surte la receta y alguna farmacias pueden ofrecer precios ms baratos.  El sitio web www.goodrx.com tiene cupones para medicamentos de health and safety inspector. Los precios aqu no tienen en cuenta lo que podra costar con la ayuda del seguro (puede ser ms barato con su seguro), pero el sitio web puede darle el precio si no utiliz tourist information centre manager.  - Puede imprimir el cupn correspondiente y llevarlo con su receta a la farmacia.  - Tambin puede pasar por nuestra oficina durante el horario de atencin regular y education officer, museum una tarjeta de cupones de GoodRx.  - Si necesita que su receta se enve electrnicamente a una farmacia diferente, informe a nuestra oficina a travs de MyChart de [facility] o por telfono llamando al [phone] y presione la opcin 4.

## 2024-10-07 ENCOUNTER — Ambulatory Visit: Payer: Self-pay | Admitting: Dermatology

## 2024-10-07 LAB — SURGICAL PATHOLOGY

## 2024-10-08 ENCOUNTER — Encounter: Payer: Self-pay | Admitting: Dermatology

## 2024-10-08 NOTE — Telephone Encounter (Signed)
-----   Message from Rexene Rattler sent at 10/07/2024  5:52 PM EST ----- 1. Skin, right forearm :       WELL DIFFERENTIATED SQUAMOUS CELL CARCINOMA, BASE INVOLVED  SCC skin cancer- already treated with EDC at time of biopsy   - please call patient ----- Message ----- From: Interface, Lab In Three Zero One Sent: 10/07/2024   5:20 PM EST To: Rexene Rattler, MD

## 2024-10-08 NOTE — Telephone Encounter (Signed)
LM on VM please return my call  

## 2024-10-08 NOTE — Telephone Encounter (Signed)
Left message for patient to call for bx results.  

## 2024-10-08 NOTE — Telephone Encounter (Signed)
Patient advised pathology results showed SCC, already treated with EDC. Lurlean Horns., RMA

## 2024-11-08 ENCOUNTER — Ambulatory Visit: Payer: Self-pay

## 2024-11-08 NOTE — Telephone Encounter (Signed)
" ° °  FYI Only or Action Required?: FYI only for provider: appointment scheduled on 12.23.25.  Patient was last seen in primary care on 09/20/2024 by Ostwalt, Janna, PA-C.  Called Nurse Triage reporting Hypertension.  Symptoms began several days ago.  Interventions attempted: Nothing.  Symptoms are: stable.  Triage Disposition: See PCP Within 2 Weeks  Patient/caregiver understands and will follow disposition?: Yes  Copied from CRM #[id number]. Topic: Clinical - Red Word Triage >> Nov 08, 2024 11:32 AM Donna BRAVO wrote: Red Word that prompted transfer to Nurse Triage:  BP issues -whith exertion BP goes up   11/08/24  183/85  pulse 75 -no headache -no blury vision -always short of breath, have been tested by cardiology and plumaniry -anxiety  -feeling off Reason for Disposition  [1] Systolic BP >= 130 OR Diastolic >= 80 AND [2] not taking BP medications  Answer Assessment - Initial Assessment Questions 1. BLOOD PRESSURE: What is your blood pressure? Did you take at least two measurements 5 minutes apart?      183/85 P 75  2. ONSET: When did you take your blood pressure?      X 3 days  3. HOW: How did you take your blood pressure? (e.g., automatic home BP monitor, visiting nurse)   Automatic bp machine       4. HISTORY: Do you have a history of high blood pressure?      Pt does not have hx of HTN  5. MEDICINES: Are you taking any medicines for blood pressure? Have you missed any doses recently?      Denies  6. OTHER SYMPTOMS: Pt denies blurred vision, headache, weakness   NAD noted       Pt reports elevated bp. Pt states she is excessively checking bp giving 2 minutes in between checks. Anxiety noted. Pt agrees she is a little anxious.  Pt denies taking medication for anxiousness Pt upset about fight between her cats. Pt scheduled for a visit on 12.23.25  for further evaluation at an alternative location due to lack of appt availability at PCP office. Pt  agrees with plan of care, will call back for any worsening symptoms  Protocols used: Blood Pressure - High-A-AH  "

## 2024-11-08 NOTE — Telephone Encounter (Signed)
 Noted, will evaluate.

## 2024-11-12 ENCOUNTER — Ambulatory Visit: Payer: Self-pay | Admitting: Primary Care

## 2024-11-12 ENCOUNTER — Ambulatory Visit: Admitting: Primary Care

## 2024-11-12 ENCOUNTER — Encounter: Payer: Self-pay | Admitting: Primary Care

## 2024-11-12 VITALS — BP 126/68 | HR 61 | Temp 98.1°F | Ht 68.5 in | Wt 139.0 lb

## 2024-11-12 DIAGNOSIS — I2510 Atherosclerotic heart disease of native coronary artery without angina pectoris: Secondary | ICD-10-CM | POA: Diagnosis not present

## 2024-11-12 DIAGNOSIS — R0609 Other forms of dyspnea: Secondary | ICD-10-CM | POA: Diagnosis not present

## 2024-11-12 LAB — BASIC METABOLIC PANEL WITH GFR
BUN: 18 mg/dL (ref 6–23)
CO2: 32 meq/L (ref 19–32)
Calcium: 9.8 mg/dL (ref 8.4–10.5)
Chloride: 100 meq/L (ref 96–112)
Creatinine, Ser: 0.76 mg/dL (ref 0.40–1.20)
GFR: 77.92 mL/min
Glucose, Bld: 80 mg/dL (ref 70–99)
Potassium: 4.6 meq/L (ref 3.5–5.1)
Sodium: 139 meq/L (ref 135–145)

## 2024-11-12 LAB — BRAIN NATRIURETIC PEPTIDE: Pro B Natriuretic peptide (BNP): 62 pg/mL (ref 1.0–100.0)

## 2024-11-12 NOTE — Assessment & Plan Note (Signed)
 Differentials include CAD, Parkinson's disease, CHF, anxiety. Based on our discussion today she does not appear to be an anxious person. Less likely PE but will keep on differentials.  Given her mildly abnormal stress test and coronary calcium  score we will defer her back to cardiology for further testing. EKG today with sinus bradycardia with rate of 54. No acute ST changes, PAC/PVC.  Appears similar to EKG from December 2024.  Checking labs today including BNP, BMP, D-dimer. Thyroid  studies reviewed from July 2025. Reviewed chest x-ray from 2023

## 2024-11-12 NOTE — Progress Notes (Signed)
 "  Subjective:    Patient ID: Barbara Gates, female    DOB: 30-Jan-1951, 73 y.o.   MRN: [id number]  Barbara Gates is a very pleasant 73 y.o. female patient of Dr. Donzella with a history of memory impairment, insomnia, CAD, parkinsonism, CKD, hyperlipidemia who presents today to discuss exertional dyspnea.  Chronic history of exertional dyspnea with moderate activity for which began years ago. Occasionally she experiences resting shortness of breath. Over the last week she has noted elevated blood pressure readings of 147/85, 183/78 with a pulse rate of 46 and 61 bpm.  Her heart rate reading of 46 was after she climbed a flight of stairs. She has no prior history of hypertension, but she does have a family history and multiple relatives.  She also has a chronic history of cold intolerance, bradycardia. She underwent exercise stress test in 2021 which showed Mild inferolateral ST depression noted at peak stress with associated shortness of breath.  This is an intermediate risk stress test. She underwent CT coronary morphology scan in 2021 with calcium  sore of 72, mild non obstructive CAD. She exercises regularly, works at [employer], tries to remain independent.   Evaluated by cardiology in September 2025 for elevated LP(a). She is statin and Repatha  intolerant. She declined a trial of Leqvio. She is a non smoker. She was told to follow up as needed.   She does over analyze situations, but doesn't feel like an anxious person. She exercises daily, also participates in rock-box-step for her parkinson's. She holds a position at [employer].   BP Readings from Last 3 Encounters:  11/12/24 126/68  09/20/24 (!) 145/57  09/03/24 (!) 144/72      Review of Systems  Constitutional:  Negative for fatigue.  Respiratory:  Positive for shortness of breath.   Cardiovascular:  Negative for chest pain.  Neurological:  Negative for dizziness.  Psychiatric/Behavioral:  The patient is not  nervous/anxious.          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 Mancil   Hypercalcemia    Hyperlipidemia    Has history of this   Motion sickness    Nodule of vagina    Osteoporosis    Parkinson's disease (HCC) 03/2021   Redundant colon    SOB (shortness of breath)    Squamous cell carcinoma of skin 10/03/2024   R forearm, ED[facility]    Social History   Socioeconomic History   Marital status: Married    Spouse name: Not on file   Number of children: 0   Years of education: Not on file   Highest education level: Bachelor's degree (e.g., BA, AB, BS)  Occupational History   Occupation: retired  Tobacco Use   Smoking status: Never   Smokeless tobacco: Never  Vaping Use   Vaping status: Never Used  Substance and Sexual Activity   Alcohol use: Yes    Alcohol/week: 1.0 standard drink of alcohol    Types: 1 Glasses of wine  per week    Comment: 1 glass of wine on Friday.   Drug use: Never   Sexual activity: Not Currently    Birth control/protection: Surgical    Comment: Hysterecetomy  Other Topics Concern   Not on file  Social History Narrative   Not on file   Social Drivers of Health   Tobacco Use: Low Risk (11/12/2024)   Patient History    Smoking Tobacco Use: Never    Smokeless Tobacco Use: Never    Passive Exposure: Not on file  Financial Resource Strain: Low Risk (09/02/2024)   Overall Financial Resource Strain (CARDIA)    Difficulty of Paying Living Expenses: Not hard at all  Food Insecurity: No Food Insecurity (09/02/2024)   Epic    Worried  About Programme Researcher, Broadcasting/film/video in the Last Year: Never true    Ran Out of Food in the Last Year: Never true  Transportation Needs: No Transportation Needs (09/02/2024)   Epic    Lack of Transportation (Medical): No    Lack of Transportation (Non-Medical): No  Physical Activity: Sufficiently Active (09/02/2024)   Exercise Vital Sign    Days of Exercise per Week: 5 days    Minutes of Exercise per Session: 30 min  Stress: Patient Declined (09/02/2024)   [employer] of Occupational Health - Occupational Stress Questionnaire    Feeling of Stress: Patient declined  Social Connections: Moderately Integrated (09/02/2024)   Social Connection and Isolation Panel    Frequency of Communication with Friends and Family: Twice a week    Frequency of Social Gatherings with Friends and Family: More than three times a week    Attends Religious Services: Patient declined    Active Member of Clubs or Organizations: Yes    Attends Engineer, Structural: More than 4 times per year    Marital Status: Married  Catering Manager Violence: Not on file  Depression (PHQ2-9): Low Risk (11/12/2024)   Depression (PHQ2-9)    PHQ-2 Score: 4  Alcohol Screen: Low Risk (09/02/2024)   Alcohol Screen    Last Alcohol Screening Score (AUDIT): 2  Housing: Low Risk (09/02/2024)   Epic    Unable to Pay for Housing in the Last Year: No    Number of Times Moved in the Last Year: 0    Homeless in the Last Year: No  Utilities: Not At Risk (08/30/2023)   [facility] Utilities    Threatened with loss of utilities: No  Health Literacy: Adequate Health Literacy (09/20/2024)   B1300 Health Literacy    Frequency of need for help with medical instructions: Never    Past Surgical History:  Procedure Laterality Date   ABDOMINAL HYSTERECTOMY  1994   Total including ovarie. Due to endometriosis   APPENDECTOMY     with hysterectomy    BREAST BIOPSY Left 11/08/2012   Dr. Dessa, neg   CATARACT EXTRACTION W/PHACO Right  05/27/2020   Procedure: CATARACT EXTRACTION PHACO AND INTRAOCULAR LENS PLACEMENT (IOC) RIGHT PANOPTIC LENS;  Surgeon: Mittie Gaskin, MD;  Location: [facility];  Service: Ophthalmology;  Laterality: Right;  2.14 0:43.8 4.8%   CATARACT EXTRACTION W/PHACO Left 07/22/2020   Procedure: CATARACT EXTRACTION PHACO AND INTRAOCULAR LENS PLACEMENT (IOC) LEFT PANOPTIX LENS 5.31 00:49.1 10.8%;  Surgeon: Mittie Gaskin, MD;  Location: [facility];  Service: Ophthalmology;  Laterality: Left;   EYE SURGERY  2021   cataracts   granular cell tumor removed     OTHER SURGICAL HISTORY     Surgical Lipoma  Removal; 12/13/2012  12/13/2012   VULVAR LESION REMOVAL Left 08/22/2018   Procedure: Excision left vulvar mass, possible sentinel node mapping with blue dye, possible left inguinal lymph node biopsy or dissection;  Surgeon: Mancil Barter, MD;  Location: [facility];  Service: Gynecology;  Laterality: Left;    Family History  Problem Relation Age of Onset   Cancer Mother        lung, pancreas, melanoma, carcinoid tumors   Hypertension Mother    Hyperlipidemia Mother    Diabetes Mother    Hearing loss Mother    Vision loss Mother    Hypertension Father    Diabetes Father    Hyperlipidemia Father    Parkinson's disease Father    CAD Father    Vascular Disease Father        hardening of the arteries   Hearing loss Father    Heart disease Father    Diabetes Brother    Hypertension Brother    ADD / ADHD Brother    Alcohol abuse Brother    Cancer Brother    Breast cancer Sister 10   Arthritis Sister    Hypertension Sister    Dupuytren's contracture Sister    Cancer Sister    Early death Maternal Grandfather    Heart disease Maternal Grandmother    Heart disease Paternal Grandfather    Early death Paternal Grandmother    Heart disease Paternal Grandmother    Diabetes Brother    Hearing loss Brother    Hyperlipidemia Brother    Obesity Brother      Allergies[1]  Medications Ordered Prior to Encounter[2]  BP 126/68   Pulse 61   Temp 98.1 F (36.7 C) (Oral)   Ht 5' 8.5 (1.74 m)   Wt 139 lb (63 kg)   SpO2 99%   BMI 20.83 kg/m  Objective:   Physical Exam Cardiovascular:     Rate and Rhythm: Normal rate and regular rhythm.  Pulmonary:     Effort: Pulmonary effort is normal.     Breath sounds: Normal breath sounds.  Musculoskeletal:     Cervical back: Neck supple.  Skin:    General: Skin is warm and dry.  Neurological:     Mental Status: She is alert and oriented to person, place, and time.  Psychiatric:        Mood and Affect: Mood normal.     Physical Exam        Assessment & Plan:  Exertional dyspnea Assessment & Plan: Differentials include CAD, Parkinson's disease, CHF, anxiety. Based on our discussion today she does not appear to be an anxious person. Less likely PE but will keep on differentials.  Given her mildly abnormal stress test and coronary calcium  score we will defer her back to cardiology for further testing. EKG today with sinus bradycardia with rate of 54. No acute ST changes, PAC/PVC.  Appears similar to EKG from December 2024.  Checking labs today including BNP, BMP, D-dimer. Thyroid  studies reviewed from July 2025. Reviewed chest x-ray from 2023  Orders: -     Brain natriuretic peptide -     Basic metabolic panel with GFR -     EKG 12-Lead -     D-dimer, quantitative  CAD in native artery -     Brain natriuretic peptide -     Basic metabolic panel with GFR -     EKG 12-Lead    Assessment and Plan Assessment & Plan  Destany Severns K Poetry Cerro, NP       [1]  Allergies Allergen Reactions   Methocarbamol     Cramping and Aching   Other Hives    selamectin active ingredient in Revolution Plus for cats.   Poison Ivy Extract Itching   Raloxifene  Hcl Cough        Repatha  [Evolocumab ]     Patient states she could smell things that werent pleasant causing her to  gag and cough   Systane [Polyethyl Glycol-Propyl Glycol] Itching   Tamoxifen Cough   Wasp Venom Protein Hives   Xiidra [Lifitegrast] Itching   Mango Flavoring Agent (Non-Screening) Itching and Rash  [2]  Current Outpatient Medications on File Prior to Visit  Medication Sig Dispense Refill   Bempedoic Acid  180 MG TABS Take 1 tablet (180 mg total) by mouth daily. 90 tablet 3   Coenzyme Q10 10 MG capsule Take 1 capsule by mouth daily.     estradiol  (ESTRACE  VAGINAL) 0.1 MG/GM vaginal cream Place 1 Applicatorful vaginally at bedtime. May also be applied directly to vulvar and vestibular tissues. Initial management once daily for 2 weeks. Maintenance: one to three times per week (Patient taking differently: Place 1 Applicatorful vaginally as needed. May also be applied directly to vulvar and vestibular tissues. Initial management once daily for 2 weeks. Maintenance: one to three times per week) 42.5 g 12   Flaxseed, Linseed, (FLAX SEED OIL) 1000 MG CAPS Take 1 capsule by mouth daily.     levOCARNitine (L-CARNITINE) 500 MG TABS Take 500 mg by mouth daily.     Lysine 600 MG TABS Take 1 tablet by mouth every other day.     Magnesium 250 MG TABS Take 2 tablets by mouth daily.      MAGNESIUM GLYCINATE PO Take by mouth.     Multiple Vitamins-Minerals (CENTRUM SILVER 50+WOMEN) TABS Take by mouth.     Perfluorohexyloctane (MIEBO OP) Apply 3 drops to eye daily.     PHOSPHATIDYLSERINE PO daily. (Patient taking differently: Take 2 capsules by mouth daily.)     senna-docusate (SENOKOT-S) 8.6-50 MG tablet Take 2 tablets by mouth every evening.  (Patient taking differently: Take 1 tablet by mouth every evening.)     thiamine (VITAMIN B-1) 50 MG tablet Take 50 mg by mouth daily.     tretinoin  (RETIN-A ) 0.05 % cream Apply topically at bedtime. (Patient taking differently: Apply topically as needed.)     triamcinolone  cream (KENALOG ) 0.1 % Apply 1 Application topically 2 (two) times daily. (Patient taking  differently: Apply 1 Application topically as needed.)     No current facility-administered medications on file prior to visit.   "

## 2024-11-12 NOTE — Patient Instructions (Addendum)
 Stop by the lab prior to leaving today. I will notify you of your results once received.   Contact your cardiologist for a follow-up visit.  It was a pleasure meeting you!

## 2024-11-13 LAB — D-DIMER, QUANTITATIVE: D-Dimer, Quant: 0.34 ug{FEU}/mL

## 2024-12-18 ENCOUNTER — Telehealth: Payer: Self-pay | Admitting: Family Medicine

## 2024-12-18 NOTE — Telephone Encounter (Signed)
 Yes, okay to schedule. Thanks!  Can we schedule her as the last slot of the morning or afternoon session?

## 2024-12-18 NOTE — Telephone Encounter (Signed)
 Copied from CRM [id number]. Topic: Appointments - Scheduling Inquiry for Clinic >> Dec 18, 2024 11:39 AM Montie POUR wrote: Reason for CRM:  Ms. Snodgrass is calling to schedule a new patient appointment with NP Comer Gaskins. [facility] is showing she is not accepting new patient. When Ms. Binney came in a saw her for an office visit appointment on 11/12/24, NP Gaskins told her she would accept her as a new patient and she just needed to call and schedule a transfer of care new patient appointment. Please call Ms. Luckadoo at [phone] to discuss and leave message if she is not at home.

## 2025-01-22 ENCOUNTER — Encounter: Admitting: Primary Care

## 2025-03-04 ENCOUNTER — Ambulatory Visit: Admitting: Family Medicine

## 2025-04-01 ENCOUNTER — Ambulatory Visit: Admitting: Dermatology

## 2025-10-07 ENCOUNTER — Encounter: Admitting: Dermatology
